# Patient Record
Sex: Female | Born: 1945
Health system: Southern US, Community
[De-identification: ages and names within clinical notes are randomized; demographics above are authoritative.]

## PROBLEM LIST (undated history)

## (undated) DIAGNOSIS — F329 Major depressive disorder, single episode, unspecified: Secondary | ICD-10-CM

## (undated) DIAGNOSIS — G43909 Migraine, unspecified, not intractable, without status migrainosus: Secondary | ICD-10-CM

## (undated) DIAGNOSIS — C50919 Malignant neoplasm of unspecified site of unspecified female breast: Secondary | ICD-10-CM

## (undated) DIAGNOSIS — K219 Gastro-esophageal reflux disease without esophagitis: Secondary | ICD-10-CM

## (undated) DIAGNOSIS — M810 Age-related osteoporosis without current pathological fracture: Secondary | ICD-10-CM

## (undated) DIAGNOSIS — K649 Unspecified hemorrhoids: Secondary | ICD-10-CM

## (undated) DIAGNOSIS — E039 Hypothyroidism, unspecified: Secondary | ICD-10-CM

## (undated) DIAGNOSIS — I10 Essential (primary) hypertension: Secondary | ICD-10-CM

## (undated) DIAGNOSIS — F32A Depression, unspecified: Secondary | ICD-10-CM

## (undated) DIAGNOSIS — E0500 Thyrotoxicosis with diffuse goiter without thyrotoxic crisis or storm: Secondary | ICD-10-CM

## (undated) DIAGNOSIS — Z923 Personal history of irradiation: Secondary | ICD-10-CM

## (undated) DIAGNOSIS — E559 Vitamin D deficiency, unspecified: Secondary | ICD-10-CM

## (undated) HISTORY — DX: Migraine, unspecified, not intractable, without status migrainosus: G43.909

## (undated) HISTORY — PX: CHOLECYSTECTOMY: SHX55

## (undated) HISTORY — PX: SPINE SURGERY: SHX786

## (undated) HISTORY — DX: Vitamin D deficiency, unspecified: E55.9

## (undated) HISTORY — DX: Major depressive disorder, single episode, unspecified: F32.9

## (undated) HISTORY — DX: Gastro-esophageal reflux disease without esophagitis: K21.9

## (undated) HISTORY — DX: Depression, unspecified: F32.A

## (undated) HISTORY — DX: Thyrotoxicosis with diffuse goiter without thyrotoxic crisis or storm: E05.00

## (undated) HISTORY — DX: Age-related osteoporosis without current pathological fracture: M81.0

## (undated) HISTORY — DX: Unspecified hemorrhoids: K64.9

## (undated) HISTORY — DX: Essential (primary) hypertension: I10

## (undated) HISTORY — DX: Malignant neoplasm of unspecified site of unspecified female breast: C50.919

---

## 1995-08-28 DIAGNOSIS — C50919 Malignant neoplasm of unspecified site of unspecified female breast: Secondary | ICD-10-CM

## 1995-08-28 HISTORY — DX: Malignant neoplasm of unspecified site of unspecified female breast: C50.919

## 1996-08-11 HISTORY — PX: BREAST BIOPSY: SHX20

## 1996-08-27 DIAGNOSIS — Z923 Personal history of irradiation: Secondary | ICD-10-CM

## 1996-08-27 HISTORY — DX: Personal history of irradiation: Z92.3

## 1996-08-27 HISTORY — PX: BREAST LUMPECTOMY: SHX2

## 1998-04-15 ENCOUNTER — Encounter: Payer: Self-pay | Admitting: Obstetrics and Gynecology

## 1998-04-15 ENCOUNTER — Ambulatory Visit (HOSPITAL_COMMUNITY): Admission: RE | Admit: 1998-04-15 | Discharge: 1998-04-15 | Payer: Self-pay | Admitting: Obstetrics and Gynecology

## 1998-05-20 ENCOUNTER — Ambulatory Visit (HOSPITAL_COMMUNITY): Admission: RE | Admit: 1998-05-20 | Discharge: 1998-05-20 | Payer: Self-pay | Admitting: Neurosurgery

## 1998-05-30 ENCOUNTER — Encounter: Admission: RE | Admit: 1998-05-30 | Discharge: 1998-08-28 | Payer: Self-pay | Admitting: Radiation Oncology

## 1998-10-11 ENCOUNTER — Encounter: Payer: Self-pay | Admitting: Obstetrics and Gynecology

## 1998-10-11 ENCOUNTER — Ambulatory Visit (HOSPITAL_COMMUNITY): Admission: RE | Admit: 1998-10-11 | Discharge: 1998-10-11 | Payer: Self-pay | Admitting: Obstetrics and Gynecology

## 1999-04-07 ENCOUNTER — Encounter: Payer: Self-pay | Admitting: Obstetrics and Gynecology

## 1999-04-07 ENCOUNTER — Ambulatory Visit (HOSPITAL_COMMUNITY): Admission: RE | Admit: 1999-04-07 | Discharge: 1999-04-07 | Payer: Self-pay | Admitting: Obstetrics and Gynecology

## 1999-06-20 ENCOUNTER — Other Ambulatory Visit: Admission: RE | Admit: 1999-06-20 | Discharge: 1999-06-20 | Payer: Self-pay | Admitting: Obstetrics and Gynecology

## 1999-08-01 ENCOUNTER — Encounter: Admission: RE | Admit: 1999-08-01 | Discharge: 1999-08-01 | Payer: Self-pay | Admitting: Obstetrics and Gynecology

## 1999-08-01 ENCOUNTER — Encounter: Payer: Self-pay | Admitting: Obstetrics and Gynecology

## 2000-05-28 ENCOUNTER — Other Ambulatory Visit: Admission: RE | Admit: 2000-05-28 | Discharge: 2000-05-28 | Payer: Self-pay | Admitting: Obstetrics and Gynecology

## 2001-07-03 ENCOUNTER — Other Ambulatory Visit: Admission: RE | Admit: 2001-07-03 | Discharge: 2001-07-03 | Payer: Self-pay | Admitting: Obstetrics and Gynecology

## 2001-07-08 ENCOUNTER — Encounter: Admission: RE | Admit: 2001-07-08 | Discharge: 2001-07-08 | Payer: Self-pay | Admitting: Obstetrics and Gynecology

## 2001-07-08 ENCOUNTER — Encounter: Payer: Self-pay | Admitting: Obstetrics and Gynecology

## 2002-06-11 ENCOUNTER — Ambulatory Visit (HOSPITAL_COMMUNITY): Admission: RE | Admit: 2002-06-11 | Discharge: 2002-06-11 | Payer: Self-pay | Admitting: Oncology

## 2002-06-11 ENCOUNTER — Encounter (HOSPITAL_COMMUNITY): Payer: Self-pay | Admitting: Oncology

## 2002-06-16 ENCOUNTER — Other Ambulatory Visit: Admission: RE | Admit: 2002-06-16 | Discharge: 2002-06-16 | Payer: Self-pay | Admitting: *Deleted

## 2002-07-09 ENCOUNTER — Encounter: Admission: RE | Admit: 2002-07-09 | Discharge: 2002-07-09 | Payer: Self-pay | Admitting: *Deleted

## 2002-07-20 ENCOUNTER — Encounter: Admission: RE | Admit: 2002-07-20 | Discharge: 2002-07-20 | Payer: Self-pay | Admitting: *Deleted

## 2003-06-24 ENCOUNTER — Other Ambulatory Visit: Admission: RE | Admit: 2003-06-24 | Discharge: 2003-06-24 | Payer: Self-pay | Admitting: *Deleted

## 2003-07-13 ENCOUNTER — Encounter: Admission: RE | Admit: 2003-07-13 | Discharge: 2003-07-13 | Payer: Self-pay | Admitting: *Deleted

## 2004-07-03 ENCOUNTER — Ambulatory Visit: Payer: Self-pay | Admitting: Endocrinology

## 2004-07-13 ENCOUNTER — Ambulatory Visit: Payer: Self-pay | Admitting: Internal Medicine

## 2004-07-31 ENCOUNTER — Ambulatory Visit: Payer: Self-pay | Admitting: Endocrinology

## 2004-08-02 ENCOUNTER — Ambulatory Visit: Payer: Self-pay | Admitting: Endocrinology

## 2004-08-18 ENCOUNTER — Ambulatory Visit: Payer: Self-pay | Admitting: Internal Medicine

## 2004-11-29 ENCOUNTER — Ambulatory Visit: Payer: Self-pay | Admitting: Endocrinology

## 2005-01-01 ENCOUNTER — Ambulatory Visit: Payer: Self-pay | Admitting: Endocrinology

## 2005-01-15 ENCOUNTER — Ambulatory Visit: Payer: Self-pay | Admitting: Endocrinology

## 2005-01-29 ENCOUNTER — Ambulatory Visit: Payer: Self-pay | Admitting: Endocrinology

## 2005-08-22 ENCOUNTER — Encounter: Admission: RE | Admit: 2005-08-22 | Discharge: 2005-08-22 | Payer: Self-pay | Admitting: *Deleted

## 2005-09-21 ENCOUNTER — Ambulatory Visit: Payer: Self-pay | Admitting: Endocrinology

## 2005-12-26 ENCOUNTER — Encounter: Payer: Self-pay | Admitting: Radiation Oncology

## 2006-08-26 ENCOUNTER — Encounter: Admission: RE | Admit: 2006-08-26 | Discharge: 2006-08-26 | Payer: Self-pay | Admitting: *Deleted

## 2006-12-19 ENCOUNTER — Ambulatory Visit: Payer: Self-pay | Admitting: Endocrinology

## 2007-03-13 ENCOUNTER — Ambulatory Visit: Payer: Self-pay | Admitting: Endocrinology

## 2007-04-26 ENCOUNTER — Encounter: Payer: Self-pay | Admitting: Endocrinology

## 2007-04-26 DIAGNOSIS — E039 Hypothyroidism, unspecified: Secondary | ICD-10-CM | POA: Insufficient documentation

## 2007-04-26 DIAGNOSIS — F329 Major depressive disorder, single episode, unspecified: Secondary | ICD-10-CM | POA: Insufficient documentation

## 2007-04-26 DIAGNOSIS — Z853 Personal history of malignant neoplasm of breast: Secondary | ICD-10-CM | POA: Insufficient documentation

## 2007-04-26 DIAGNOSIS — F3289 Other specified depressive episodes: Secondary | ICD-10-CM | POA: Insufficient documentation

## 2007-04-26 DIAGNOSIS — M8580 Other specified disorders of bone density and structure, unspecified site: Secondary | ICD-10-CM | POA: Insufficient documentation

## 2007-09-01 ENCOUNTER — Encounter: Admission: RE | Admit: 2007-09-01 | Discharge: 2007-09-01 | Payer: Self-pay | Admitting: Obstetrics & Gynecology

## 2008-09-02 ENCOUNTER — Encounter: Admission: RE | Admit: 2008-09-02 | Discharge: 2008-09-02 | Payer: Self-pay | Admitting: Obstetrics & Gynecology

## 2009-09-08 ENCOUNTER — Encounter: Admission: RE | Admit: 2009-09-08 | Discharge: 2009-09-08 | Payer: Self-pay | Admitting: Obstetrics & Gynecology

## 2010-09-14 ENCOUNTER — Encounter
Admission: RE | Admit: 2010-09-14 | Discharge: 2010-09-14 | Payer: Self-pay | Source: Home / Self Care | Attending: Obstetrics & Gynecology | Admitting: Obstetrics & Gynecology

## 2011-03-20 ENCOUNTER — Other Ambulatory Visit: Payer: Self-pay | Admitting: Neurosurgery

## 2011-03-20 DIAGNOSIS — M545 Low back pain, unspecified: Secondary | ICD-10-CM

## 2011-03-23 ENCOUNTER — Ambulatory Visit
Admission: RE | Admit: 2011-03-23 | Discharge: 2011-03-23 | Disposition: A | Discharge status: Home / Self Care | Payer: Commercial Managed Care - PPO | Source: Ambulatory Visit | Attending: Neurosurgery | Admitting: Neurosurgery

## 2011-03-23 DIAGNOSIS — M545 Low back pain, unspecified: Secondary | ICD-10-CM

## 2011-03-24 ENCOUNTER — Other Ambulatory Visit: Payer: Self-pay

## 2011-08-10 ENCOUNTER — Other Ambulatory Visit: Payer: Self-pay | Admitting: Obstetrics and Gynecology

## 2011-08-10 DIAGNOSIS — M8580 Other specified disorders of bone density and structure, unspecified site: Secondary | ICD-10-CM

## 2011-08-10 DIAGNOSIS — Z1231 Encounter for screening mammogram for malignant neoplasm of breast: Secondary | ICD-10-CM

## 2011-09-06 ENCOUNTER — Other Ambulatory Visit: Payer: Self-pay | Admitting: Endocrinology

## 2011-09-06 DIAGNOSIS — R51 Headache: Secondary | ICD-10-CM

## 2011-09-07 ENCOUNTER — Ambulatory Visit
Admission: RE | Admit: 2011-09-07 | Discharge: 2011-09-07 | Disposition: A | Discharge status: Home / Self Care | Payer: Commercial Managed Care - PPO | Source: Ambulatory Visit | Attending: Endocrinology | Admitting: Endocrinology

## 2011-09-07 DIAGNOSIS — R51 Headache: Secondary | ICD-10-CM

## 2011-09-25 ENCOUNTER — Ambulatory Visit
Admission: RE | Admit: 2011-09-25 | Discharge: 2011-09-25 | Disposition: A | Discharge status: Home / Self Care | Payer: Commercial Managed Care - PPO | Source: Ambulatory Visit | Attending: Obstetrics and Gynecology | Admitting: Obstetrics and Gynecology

## 2011-09-25 DIAGNOSIS — Z1231 Encounter for screening mammogram for malignant neoplasm of breast: Secondary | ICD-10-CM

## 2011-09-25 DIAGNOSIS — M8580 Other specified disorders of bone density and structure, unspecified site: Secondary | ICD-10-CM

## 2012-01-17 ENCOUNTER — Ambulatory Visit (INDEPENDENT_AMBULATORY_CARE_PROVIDER_SITE_OTHER): Payer: Commercial Managed Care - PPO | Admitting: Family Medicine

## 2012-01-17 DIAGNOSIS — J309 Allergic rhinitis, unspecified: Secondary | ICD-10-CM

## 2012-01-17 DIAGNOSIS — H9209 Otalgia, unspecified ear: Secondary | ICD-10-CM

## 2012-01-17 DIAGNOSIS — J029 Acute pharyngitis, unspecified: Secondary | ICD-10-CM

## 2012-01-17 LAB — POCT RAPID STREP A (OFFICE): Rapid Strep A Screen: NEGATIVE

## 2012-01-17 NOTE — Progress Notes (Signed)
  Subjective:    Patient ID: Victoria Blair, female    DOB: Mar 12, 1946, 66 y.o.   MRN: [id number]  HPI Victoria Blair is a 66 y.o. female Sore throat started last night.  Both ears bothering her since February - followed by Dr. Lucianne Muss.  Had CT scan. Seen by Dr. Ezzard Standing in March. Sinus pressure.  Sudafed prior.  Not taking anything for allergies.   Prior on ear drops.  Itchy eras - now applying tac 0.1% cream.  Review of Systems  Constitutional: Negative for fever and chills.  HENT: Positive for ear pain, congestion, sore throat, voice change and sinus pressure. Negative for trouble swallowing.        Objective:   Physical Exam  Constitutional: She is oriented to person, place, and time. She appears well-developed and well-nourished. No distress.  HENT:  Head: Normocephalic and atraumatic.  Right Ear: Hearing, tympanic membrane and ear canal normal. Tympanic membrane is not injected, not erythematous and not bulging. No middle ear effusion.  Left Ear: Hearing, tympanic membrane and ear canal normal. Tympanic membrane is not injected, not erythematous and not bulging.  No middle ear effusion.  Nose: Nose normal. Right sinus exhibits no maxillary sinus tenderness and no frontal sinus tenderness. Left sinus exhibits no maxillary sinus tenderness and no frontal sinus tenderness.  Mouth/Throat: Oropharynx is clear and moist. No oropharyngeal exudate.       Dry skin at enntry of canals bilaterally L>R. No canal edema/erythema.  Eyes: Conjunctivae and EOM are normal. Pupils are equal, round, and reactive to light.  Cardiovascular: Normal rate, regular rhythm, normal heart sounds and intact distal pulses.   No murmur heard. Pulmonary/Chest: Effort normal and breath sounds normal. No respiratory distress. She has no wheezes. She has no rhonchi.  Neurological: She is alert and oriented to person, place, and time.  Skin: Skin is warm and dry. No rash noted.  Psychiatric: She has a normal mood  and affect. Her behavior is normal.   Results for orders placed in visit on 01/17/12  POCT RAPID STREP A (OFFICE)      Component Value Range   Rapid Strep A Screen Negative  Negative           Assessment & Plan:  Victoria Blair is a 66 y.o. female Longstanding otalgia with external itching - ? Atopic derm component.  Cont TAC 0.1 % BID prn, eucerin lotion BID prn. Pharyngitis - URI, vs underlying allergic rhinitis.  Sx care, trial of Zyrtec 10mg  QD and recheck if not improving in 7-10 days, sooner if any worsening, including fever

## 2012-01-17 NOTE — Patient Instructions (Signed)
Follow up with Dr. Lucianne Muss for your ears, continue the steroid cream up to twice per day for itching, and you can apply Eucerin twice per day for the dry skin.  Try Zyrtec 10mg  - 1 per day for allergies. Saline nasal spray atleast 4 times per day for nasal congestion, over the counter mucinex or mucinex DM for cough if needed, drink plenty of fluids.  Return to clinic if any worsening.

## 2012-01-19 ENCOUNTER — Ambulatory Visit (INDEPENDENT_AMBULATORY_CARE_PROVIDER_SITE_OTHER): Payer: Commercial Managed Care - PPO | Admitting: Family Medicine

## 2012-01-19 VITALS — BP 107/59 | HR 100 | Temp 98.4°F | Resp 16 | Ht 66.5 in | Wt 125.8 lb

## 2012-01-19 DIAGNOSIS — S30860A Insect bite (nonvenomous) of lower back and pelvis, initial encounter: Secondary | ICD-10-CM

## 2012-01-19 DIAGNOSIS — W57XXXA Bitten or stung by nonvenomous insect and other nonvenomous arthropods, initial encounter: Secondary | ICD-10-CM

## 2012-01-19 MED ORDER — DOXYCYCLINE HYCLATE 100 MG PO TABS: ORAL_TABLET | ORAL | Status: DC

## 2012-01-19 NOTE — Progress Notes (Signed)
  Subjective:    Patient ID: Victoria Blair, female    DOB: 1946-05-17, 66 y.o.   MRN: [id number]  HPI  Patient presents after finding tick on back Monday morning. Area is now red and itchy Denies fever or chills or systemic symptoms   Review of Systems     Objective:   Physical Exam  Constitutional: She appears well-developed.  HENT:       Clear pnd  Neck: Neck supple. No thyromegaly present.  Cardiovascular: Normal rate, regular rhythm and normal heart sounds.   Pulmonary/Chest: Effort normal and breath sounds normal.  Skin:       (R) back with erythematous lesion; entire tick removed        Assessment & Plan:  Tick bite Allergic rhinitis  Doxycycline 100 mg X 2 per guidelines to prevent tick borne disease.

## 2012-01-20 ENCOUNTER — Telehealth: Payer: Self-pay

## 2012-01-20 NOTE — Telephone Encounter (Signed)
PATIENT STATES SHE SAW DR. Hal Hope Saturday FOR A TICK BITE ON HER BACK. DR. Hal Hope ONLY GAVE HER 2 PILLS. SHE WANTS TO KNOW IF SHE SHOULD HAVE MORE THAN 2 PILLS? BEST PHONE [phone] (CELL)  BEST PHARMACY CHOICE IS [facility] ([place])

## 2012-01-21 NOTE — Telephone Encounter (Signed)
Current guidelines for prevention of tick-borne illness is 200 mg of Doxycycline x 1 dose (she received 2 100 mg pills).

## 2012-01-22 NOTE — Telephone Encounter (Signed)
LMOM advising patient info below. 

## 2012-08-21 ENCOUNTER — Other Ambulatory Visit: Payer: Self-pay | Admitting: Obstetrics & Gynecology

## 2012-08-21 DIAGNOSIS — Z9889 Other specified postprocedural states: Secondary | ICD-10-CM

## 2012-08-21 DIAGNOSIS — Z1231 Encounter for screening mammogram for malignant neoplasm of breast: Secondary | ICD-10-CM

## 2012-08-21 DIAGNOSIS — Z853 Personal history of malignant neoplasm of breast: Secondary | ICD-10-CM

## 2012-09-25 ENCOUNTER — Ambulatory Visit: Payer: Commercial Managed Care - PPO

## 2012-10-02 ENCOUNTER — Ambulatory Visit: Payer: Commercial Managed Care - PPO

## 2012-10-14 ENCOUNTER — Ambulatory Visit: Payer: Commercial Managed Care - PPO

## 2012-10-21 ENCOUNTER — Other Ambulatory Visit: Payer: Self-pay | Admitting: Obstetrics & Gynecology

## 2012-10-21 DIAGNOSIS — M810 Age-related osteoporosis without current pathological fracture: Secondary | ICD-10-CM

## 2012-10-29 ENCOUNTER — Ambulatory Visit
Admission: RE | Admit: 2012-10-29 | Discharge: 2012-10-29 | Disposition: A | Discharge status: Home / Self Care | Payer: Commercial Managed Care - PPO | Source: Ambulatory Visit | Attending: Obstetrics & Gynecology | Admitting: Obstetrics & Gynecology

## 2012-10-29 DIAGNOSIS — Z853 Personal history of malignant neoplasm of breast: Secondary | ICD-10-CM

## 2012-10-29 DIAGNOSIS — Z9889 Other specified postprocedural states: Secondary | ICD-10-CM

## 2012-10-29 DIAGNOSIS — Z1231 Encounter for screening mammogram for malignant neoplasm of breast: Secondary | ICD-10-CM

## 2012-12-09 ENCOUNTER — Ambulatory Visit: Payer: Commercial Managed Care - PPO | Admitting: Family Medicine

## 2012-12-09 ENCOUNTER — Ambulatory Visit: Payer: Commercial Managed Care - PPO

## 2012-12-09 VITALS — BP 126/68 | HR 87 | Temp 97.5°F | Resp 18 | Ht 66.0 in | Wt 126.8 lb

## 2012-12-09 DIAGNOSIS — R079 Chest pain, unspecified: Secondary | ICD-10-CM

## 2012-12-09 DIAGNOSIS — M2190 Unspecified acquired deformity of unspecified limb: Secondary | ICD-10-CM

## 2012-12-09 DIAGNOSIS — M940 Chondrocostal junction syndrome [Tietze]: Secondary | ICD-10-CM

## 2012-12-09 DIAGNOSIS — T148XXA Other injury of unspecified body region, initial encounter: Secondary | ICD-10-CM

## 2012-12-09 LAB — POCT CBC
Granulocyte percent: 51 % (ref 37–80)
HCT, POC: 37.6 % — AB (ref 37.7–47.9)
Hemoglobin: 11.7 g/dL — AB (ref 12.2–16.2)
Lymph, poc: 2.9 (ref 0.6–3.4)
MCH, POC: 29.5 pg (ref 27–31.2)
MCHC: 31.1 g/dL — AB (ref 31.8–35.4)
MCV: 94.8 fL (ref 80–97)
MID (cbc): 0.6 (ref 0–0.9)
MPV: 8.6 fL (ref 0–99.8)
POC Granulocyte: 3.7 (ref 2–6.9)
POC LYMPH PERCENT: 40.1 % (ref 10–50)
POC MID %: 8.9 % (ref 0–12)
Platelet Count, POC: 246 10*3/uL (ref 142–424)
RBC: 3.97 M/uL — AB (ref 4.04–5.48)
RDW, POC: 13.3 %
WBC: 7.3 10*3/uL (ref 4.6–10.2)

## 2012-12-09 NOTE — Progress Notes (Signed)
[facility]:  Office Visit  Chief Complaint:  Chief Complaint  Patient presents with  . Chest Pain    both sides of ribs- bruising on right side and pain on left side    HPI: Victoria Blair is a 67 y.o. female who complains of bilateral chest pain on right side and then left, she had underwire bra on during a zumba class and then had bruising afterwards at the end of January, She was sore, now is having left sided chest pressure. Has had dx of breast cancer s/p lumpectomy. Has some SOB. Has been doing netty pot for possible allergies/sinus problems. Has been going on since January but since the last 1 week has gotten worse. Has crampy rib/chest pain. + smoker. + breast cancer in remission x 15 years. Last mamogram was within 1 year and did not show e/o . Recent travels was in January and in March 27, flew and drove back from [place] , sister died from pancreatic cancer. She also has osteoporosis. Denies any leg pain, swelling, tenderness. She has bilateral leg cramps which she has had for many years and that has not changed. Denies any recent surgeries. She has HTN but denies DM, XOL  Denies palpitations, diaphoresis, DOE, coughing, fatigue, numbness, tingling , abdominal or shoulder pain. 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   Past Medical History  Diagnosis Date  . Cancer   . Thyroid disease   . Osteoporosis   . Breast cancer   . Breast cancer    Past Surgical History  Procedure Laterality Date  . Breast surgery    . Cholecystectomy    . Spine surgery    . Cesarean section     History   Social History  . Marital Status: Divorced    Spouse Name: N/A    Number of Children: N/A  . Years of Education: N/A   Social History Main Topics  . Smoking status: Current Every Day Smoker -- 0.50 packs/day for 40 years    Types: Cigarettes  . Smokeless tobacco: None  . Alcohol Use: Yes     Comment: seldom  . Drug Use: No  . Sexually  Active: Yes   Other Topics Concern  . None   Social History Narrative  . None   Family History  Problem Relation Age of Onset  . Heart disease Father   . Pulmonary fibrosis Father   . Pancreatic cancer Sister   . Sleep apnea Son    Allergies  Allergen Reactions  . Paroxetine     Pt states not allergic to  . Raloxifene     Pt states not allergic to.   Prior to Admission medications   Medication Sig Start Date End Date Taking? Authorizing Provider  amLODipine-benazepril (LOTREL) 5-10 MG per capsule Take 1 capsule by mouth daily.   Yes Historical Provider, MD  Calcium Carbonate-Vitamin D (CALCIUM + D PO) Take by mouth.   Yes Historical Provider, MD  fish oil-omega-3 fatty acids 1000 MG capsule Take by mouth daily.   Yes Historical Provider, MD  levothyroxine (SYNTHROID, LEVOTHROID) 112 MCG tablet Take 112 mcg by mouth daily.   Yes Historical Provider, MD  Multiple Vitamin (MULTIVITAMIN) tablet Take 1 tablet by mouth daily.   Yes Historical Provider, MD  doxycycline (VIBRA-TABS) 100 MG tablet 2 po X 1 01/19/12   Dois Davenport, MD     ROS: The patient denies fevers, chills, night sweats, unintentional  weight loss,  palpitations, wheezing, dyspnea on exertion, nausea, vomiting, abdominal pain, dysuria, hematuria, melena, numbness, weakness, or tingling.  All other systems have been reviewed and were otherwise negative with the exception of those mentioned in the HPI and as above.    PHYSICAL EXAM: Filed Vitals:   12/09/12 1925  BP: 126/68  Pulse: 87  Temp: 97.5 F (36.4 C)  Resp: 18   Filed Vitals:   12/09/12 1925  Height: 5\' 6"  (1.676 m)  Weight: 126 lb 12.8 oz (57.516 kg)   Body mass index is 20.48 kg/(m^2).  General: Alert, no acute distress HEENT:  Normocephalic, atraumatic, oropharynx patent. EOMI, PERRLA. Fundoscopic exam normal.  Cardiovascular:  Regular rate and rhythm, no rubs murmurs or gallops.  No Carotid bruits, radial pulse intact. No pedal edema.   Respiratory: Clear to auscultation bilaterally.  No wheezes, rales, or rhonchi.  No cyanosis, no use of accessory musculature GI: No organomegaly, abdomen is soft and non-tender, positive bowel sounds.  No masses. Skin: No rashes. Neurologic: Facial musculature symmetric. Psychiatric: Patient is appropriate throughout our interaction. Lymphatic: No cervical lymphadenopathy Musculoskeletal: Gait intact. + residual bruising along rib cage at lower breast line where wire bra line is, right worse than left. She has tenderness on palpation of chest wall.   LABS: Results for orders placed in visit on 12/09/12  POCT CBC      Result Value Range   WBC 7.3  4.6 - 10.2 K/uL   Lymph, poc 2.9  0.6 - 3.4   POC LYMPH PERCENT 40.1  10 - 50 %L   MID (cbc) 0.6  0 - 0.9   POC MID % 8.9  0 - 12 %M   POC Granulocyte 3.7  2 - 6.9   Granulocyte percent 51.0  37 - 80 %G   RBC 3.97 (*) 4.04 - 5.48 M/uL   Hemoglobin 11.7 (*) 12.2 - 16.2 g/dL   HCT, POC 16.1 (*) 09.6 - 47.9 %   MCV 94.8  80 - 97 fL   MCH, POC 29.5  27 - 31.2 pg   MCHC 31.1 (*) 31.8 - 35.4 g/dL   RDW, POC 04.5     Platelet Count, POC 246  142 - 424 K/uL   MPV 8.6  0 - 99.8 fL     EKG/XRAY:   Primary read interpreted by Dr. Conley Rolls at [facility] No infiltrate or pneumothorax Increase vascular markings    ASSESSMENT/PLAN: Encounter Diagnoses  Name Primary?  . Chest pain Yes  . Sprain and strain   . Costochondritis    [name] is a pleasant 67 y/o white female with 3 month history of bilateral anterior chest pain which has worsened in last 1 week, all started after she wore underwire bra to a zumba class with residual bruising along bilateral chest wall. She does have RF for PE/DVT: breast cancer, recent long car ride 3 weeks ago. RF for CAD include HTN, +smoker. She clinically presents with s/sx of msk chest wall sprain/strain and costochondritis. Due to RF for PE/DVT I will get a D-dimer and if that is elevated patient agrees to getting  CTA to rule out PE. Her chest xray was WNL. CBC showed minimal anemia but has had colonoscopy in last 10 years, benign polyps.  Advise to ask her PCP what her normal Hgb is, she thinks it has always been slightly low. Defer EKG sicne the pain is not related to activity and has persisted for the last 3 months.  BP  has always been very well controlled.  CMP, D-dimer pending F/u prn, go to ER prn for worsening sxs   Lenton Gendreau PHUONG, DO 12/09/2012 8:58 PM

## 2012-12-10 ENCOUNTER — Telehealth: Payer: Self-pay | Admitting: Family Medicine

## 2012-12-10 LAB — D-DIMER, QUANTITATIVE: D-Dimer, Quant: 0.27 ug{FEU}/mL (ref 0.00–0.48)

## 2012-12-10 NOTE — Telephone Encounter (Signed)
D/w patient labs. NOrmal D-dimer. She will check with Dr. Lucianne Muss about her Hgb and see if that is stable. She is UTD on colonoscopy, next one is 1 year from now. Normal except for benign polyps.

## 2013-04-22 ENCOUNTER — Other Ambulatory Visit: Payer: Self-pay | Admitting: *Deleted

## 2013-04-22 MED ORDER — AMLODIPINE BESY-BENAZEPRIL HCL 5-10 MG PO CAPS: 1.0000 | ORAL_CAPSULE | Freq: Every day | ORAL | Status: DC

## 2013-05-25 ENCOUNTER — Other Ambulatory Visit: Payer: Self-pay | Admitting: *Deleted

## 2013-05-25 DIAGNOSIS — E039 Hypothyroidism, unspecified: Secondary | ICD-10-CM

## 2013-05-26 ENCOUNTER — Other Ambulatory Visit: Payer: Self-pay | Admitting: Endocrinology

## 2013-05-26 DIAGNOSIS — M810 Age-related osteoporosis without current pathological fracture: Secondary | ICD-10-CM

## 2013-05-26 DIAGNOSIS — I10 Essential (primary) hypertension: Secondary | ICD-10-CM

## 2013-05-26 DIAGNOSIS — E890 Postprocedural hypothyroidism: Secondary | ICD-10-CM | POA: Insufficient documentation

## 2013-05-26 DIAGNOSIS — E7800 Pure hypercholesterolemia, unspecified: Secondary | ICD-10-CM

## 2013-05-28 ENCOUNTER — Encounter: Payer: Self-pay | Admitting: Endocrinology

## 2013-05-28 ENCOUNTER — Ambulatory Visit: Payer: Commercial Managed Care - PPO | Admitting: Endocrinology

## 2013-05-28 ENCOUNTER — Ambulatory Visit (INDEPENDENT_AMBULATORY_CARE_PROVIDER_SITE_OTHER): Payer: Commercial Managed Care - PPO | Admitting: Endocrinology

## 2013-05-28 ENCOUNTER — Other Ambulatory Visit (INDEPENDENT_AMBULATORY_CARE_PROVIDER_SITE_OTHER): Payer: Commercial Managed Care - PPO

## 2013-05-28 VITALS — BP 114/68 | HR 67 | Temp 98.4°F | Resp 12 | Ht 67.0 in | Wt 126.4 lb

## 2013-05-28 DIAGNOSIS — M810 Age-related osteoporosis without current pathological fracture: Secondary | ICD-10-CM

## 2013-05-28 DIAGNOSIS — I10 Essential (primary) hypertension: Secondary | ICD-10-CM

## 2013-05-28 DIAGNOSIS — E039 Hypothyroidism, unspecified: Secondary | ICD-10-CM

## 2013-05-28 DIAGNOSIS — E7800 Pure hypercholesterolemia, unspecified: Secondary | ICD-10-CM

## 2013-05-28 DIAGNOSIS — E890 Postprocedural hypothyroidism: Secondary | ICD-10-CM

## 2013-05-28 MED ORDER — AMLODIPINE BESY-BENAZEPRIL HCL 2.5-10 MG PO CAPS: 1.0000 | ORAL_CAPSULE | Freq: Every day | ORAL | Status: DC

## 2013-05-28 NOTE — Progress Notes (Signed)
Patient ID: Victoria Blair, female   DOB: 01-31-1946, 67 y.o.   MRN: [id number]  Reason for Appointment:  Hypothyroidism, followup visit   History of Present Illness:   The hypothyroidism was first diagnosed in 1978 after I-131 treatment for Graves' disease The symptoms consistent with hypothyroidism are: fatigue although she complains of being tired for quite a while She does not complain of any unusual cord intolerance or hair loss  The treatments that the patient has taken include Synthroid.          Compliance with the medical regimen has been as prescribed with taking the tablet in the morning before breakfast.  HYPERTENSION: She has been on treatment since 2009 and has been on Lotrel since then with good control. Does not monitor at home     Medication List       This list is accurate as of: 05/28/13  9:03 AM.  Always use your most recent med list.               amLODipine-benazepril 5-10 MG per capsule  Commonly known as:  LOTREL  Take 1 capsule by mouth daily.     CALCIUM + D PO  Take by mouth.     doxycycline 100 MG tablet  Commonly known as:  VIBRA-TABS  2 po X 1     fish oil-omega-3 fatty acids 1000 MG capsule  Take by mouth daily.     levothyroxine 112 MCG tablet  Commonly known as:  SYNTHROID, LEVOTHROID  Take 112 mcg by mouth daily.     multivitamin tablet  Take 1 tablet by mouth daily.        Past Medical History  Diagnosis Date  . Cancer   . Thyroid disease   . Osteoporosis   . Breast cancer   . Breast cancer     Past Surgical History  Procedure Laterality Date  . Breast surgery    . Cholecystectomy    . Spine surgery    . Cesarean section      Family History  Problem Relation Age of Onset  . Heart disease Father   . Pulmonary fibrosis Father   . Pancreatic cancer Sister   . Sleep apnea Son     Social History:  reports that she has been smoking Cigarettes.  She has a 20 pack-year smoking history. She does not have any  smokeless tobacco history on file. She reports that  drinks alcohol. She reports that she does not use illicit drugs.  Allergies:  Allergies  Allergen Reactions  . Paroxetine     Pt states not allergic to  . Raloxifene     Pt states not allergic to.   REVIEW of systems:  She has osteopenia; previously was given Actonel but stopped this because of difficulty with compliance. T score -1.7 at spine and -1.1 at left hip. Taking Evista since 5/09.     Examination:   BP 110/62  Pulse 67  Temp(Src) 98.4 F (36.9 C)  Resp 12  Ht 5\' 7"  (1.702 m)  Wt 126 lb 6.4 oz (57.335 kg)  BMI 19.79 kg/m2  SpO2 97%   GENERAL APPEARANCE: Alert And looks well.  No puffiness of face or periorbital edema.         NECK: no thyromegaly.          NEUROLOGIC EXAM: DTRs 2+ bilaterally at biceps.    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

## 2013-05-28 NOTE — Patient Instructions (Addendum)
Will change BP Rx

## 2013-05-29 LAB — COMPREHENSIVE METABOLIC PANEL
AST: 24 U/L (ref 0–37)
Albumin: 3.9 g/dL (ref 3.5–5.2)
BUN: 20 mg/dL (ref 6–23)
CO2: 30 mEq/L (ref 19–32)
Calcium: 8.8 mg/dL (ref 8.4–10.5)
Chloride: 101 mEq/L (ref 96–112)
Glucose, Bld: 87 mg/dL (ref 70–99)
Potassium: 4.1 mEq/L (ref 3.5–5.1)

## 2013-05-29 LAB — LIPID PANEL
Cholesterol: 171 mg/dL (ref 0–200)
Total CHOL/HDL Ratio: 4
Triglycerides: 41 mg/dL (ref 0.0–149.0)

## 2013-05-29 NOTE — Progress Notes (Signed)
Quick Note:  Please let patient know that the lab results are good and no further action needed ______ 

## 2013-06-02 ENCOUNTER — Encounter: Payer: Self-pay | Admitting: *Deleted

## 2013-07-12 ENCOUNTER — Emergency Department (HOSPITAL_COMMUNITY)
Admission: EM | Admit: 2013-07-12 | Discharge: 2013-07-12 | Disposition: A | Discharge status: Home / Self Care | Payer: Commercial Managed Care - PPO | Attending: Emergency Medicine | Admitting: Emergency Medicine

## 2013-07-12 ENCOUNTER — Encounter (HOSPITAL_COMMUNITY): Payer: Self-pay | Admitting: Emergency Medicine

## 2013-07-12 DIAGNOSIS — Z79899 Other long term (current) drug therapy: Secondary | ICD-10-CM | POA: Insufficient documentation

## 2013-07-12 DIAGNOSIS — X500XXA Overexertion from strenuous movement or load, initial encounter: Secondary | ICD-10-CM | POA: Insufficient documentation

## 2013-07-12 DIAGNOSIS — F17200 Nicotine dependence, unspecified, uncomplicated: Secondary | ICD-10-CM | POA: Insufficient documentation

## 2013-07-12 DIAGNOSIS — Y92009 Unspecified place in unspecified non-institutional (private) residence as the place of occurrence of the external cause: Secondary | ICD-10-CM | POA: Insufficient documentation

## 2013-07-12 DIAGNOSIS — E079 Disorder of thyroid, unspecified: Secondary | ICD-10-CM | POA: Insufficient documentation

## 2013-07-12 DIAGNOSIS — S335XXA Sprain of ligaments of lumbar spine, initial encounter: Secondary | ICD-10-CM | POA: Insufficient documentation

## 2013-07-12 DIAGNOSIS — S39012A Strain of muscle, fascia and tendon of lower back, initial encounter: Secondary | ICD-10-CM

## 2013-07-12 DIAGNOSIS — Y9389 Activity, other specified: Secondary | ICD-10-CM | POA: Insufficient documentation

## 2013-07-12 DIAGNOSIS — M810 Age-related osteoporosis without current pathological fracture: Secondary | ICD-10-CM | POA: Insufficient documentation

## 2013-07-12 DIAGNOSIS — Z853 Personal history of malignant neoplasm of breast: Secondary | ICD-10-CM | POA: Insufficient documentation

## 2013-07-12 MED ORDER — HYDROCODONE-ACETAMINOPHEN 5-325 MG PO TABS: 2.0000 | ORAL_TABLET | ORAL | Status: DC | PRN

## 2013-07-12 MED ORDER — KETOROLAC TROMETHAMINE 60 MG/2ML IM SOLN
60.0000 mg | Freq: Once | INTRAMUSCULAR | Status: AC
  Administered 2013-07-12: 60 mg via INTRAMUSCULAR
  Filled 2013-07-12: qty 2

## 2013-07-12 MED ORDER — DIAZEPAM 5 MG PO TABS: 5.0000 mg | ORAL_TABLET | Freq: Two times a day (BID) | ORAL | Status: DC

## 2013-07-12 NOTE — ED Provider Notes (Signed)
CSN: [id number]     Arrival date & time 07/12/13  1037 History  This chart was scribed for non-physician practitioner, Irish Elders, NP working with Bonnita Levan. Bernette Mayers, MD by Greggory Stallion, ED scribe. This patient was seen in room TR11C/TR11C and the patient's care was started at 11:04 AM.   Chief Complaint  Patient presents with  . Back Pain   The history is provided by the patient. No language interpreter was used.   HPI Comments: Victoria Blair is a 67 y.o. female who presents to the Emergency Department complaining of gradual onset, constant lower back pain that started a few days ago. Pt states she has been moving stuff around recently. Certain movements worsen the pain. She has taken a natural muscle relaxer with little relief. Pt has a history of L4 and L5 surgery.   Past Medical History  Diagnosis Date  . Cancer   . Thyroid disease   . Osteoporosis   . Breast cancer   . Breast cancer    Past Surgical History  Procedure Laterality Date  . Breast surgery    . Cholecystectomy    . Spine surgery    . Cesarean section     Family History  Problem Relation Age of Onset  . Heart disease Father   . Pulmonary fibrosis Father   . Pancreatic cancer Sister   . Sleep apnea Son    History  Substance Use Topics  . Smoking status: Current Every Day Smoker -- 0.50 packs/day for 40 years    Types: Cigarettes  . Smokeless tobacco: Not on file  . Alcohol Use: Yes     Comment: seldom   OB History   Grav Para Term Preterm Abortions TAB SAB Ect Mult Living                 Review of Systems  Musculoskeletal: Positive for back pain and myalgias.  All other systems reviewed and are negative.   Allergies  Paroxetine and Raloxifene  Home Medications   Current Outpatient Rx  Name  Route  Sig  Dispense  Refill  . amlodipine-benazepril (LOTREL) 2.5-10 MG per capsule   Oral   Take 1 capsule by mouth daily.   30 capsule   5   . Calcium Carbonate-Vitamin D (CALCIUM + D  PO)   Oral   Take by mouth.         . fish oil-omega-3 fatty acids 1000 MG capsule   Oral   Take by mouth daily.         Marland Kitchen levothyroxine (SYNTHROID, LEVOTHROID) 112 MCG tablet   Oral   Take 112 mcg by mouth daily.         . Multiple Vitamin (MULTIVITAMIN) tablet   Oral   Take 1 tablet by mouth daily.         . raloxifene (EVISTA) 60 MG tablet   Oral   Take 60 mg by mouth daily.          BP 129/55  Pulse 90  Temp(Src) 97.5 F (36.4 C) (Oral)  Resp 22  SpO2 100%  Physical Exam  Nursing note and vitals reviewed. Constitutional: She is oriented to person, place, and time. She appears well-developed and well-nourished. No distress.  HENT:  Head: Normocephalic and atraumatic.  Eyes: EOM are normal.  Neck: Neck supple. No tracheal deviation present.  Cardiovascular: Normal rate.   Pulmonary/Chest: Effort normal. No respiratory distress.  Musculoskeletal: Normal range of motion.  Right paravertebral tenderness along  the area of her waist.   Neurological: She is alert and oriented to person, place, and time.  Skin: Skin is warm and dry.  Psychiatric: She has a normal mood and affect. Her behavior is normal.    ED Course  Procedures (including critical care time)  DIAGNOSTIC STUDIES: Oxygen Saturation is 100% on RA, normal by my interpretation.    COORDINATION OF CARE: 11:07 AM-Discussed treatment plan which includes Toradol in the ED, a muscle relaxer, warm compresses and antiinflammatory with pt at bedside and pt agreed to plan.   Labs Review Labs Reviewed - No data to display Imaging Review No results found.  EKG Interpretation   None       MDM   1. Back strain, initial encounter    Paravertebral tenderness and muscle spasm. Reports that she may have strained her back while moving things and picking up items at home, remembers her back hurting with a twisting motion. No numbness, tingling or focal deficits or weakness. No problems with gait.  Neuro exam reassuring. Percocet for break through pain and valium for muscle spasm. Discussed plan and pt agreed. VS stable for discharge.   I personally performed the services described in this documentation, which was scribed in my presence. The recorded information has been reviewed and is accurate.   Irish Elders, NP 07/12/13 [id number]

## 2013-07-12 NOTE — ED Notes (Signed)
Pt. States she had lower pain while "moving stuff around".

## 2013-07-14 NOTE — ED Provider Notes (Signed)
Medical screening examination/treatment/procedure(s) were performed by non-physician practitioner and as supervising physician I was immediately available for consultation/collaboration.  EKG Interpretation   None         Charles B. Sheldon, MD 07/14/13 0700 

## 2013-08-25 ENCOUNTER — Other Ambulatory Visit: Payer: Self-pay | Admitting: *Deleted

## 2013-08-25 MED ORDER — LEVOTHYROXINE SODIUM 112 MCG PO TABS: 112.0000 ug | ORAL_TABLET | Freq: Every morning | ORAL | Status: DC

## 2013-09-03 ENCOUNTER — Encounter: Payer: Self-pay | Admitting: *Deleted

## 2013-09-03 ENCOUNTER — Ambulatory Visit (INDEPENDENT_AMBULATORY_CARE_PROVIDER_SITE_OTHER): Payer: Commercial Managed Care - PPO | Admitting: Endocrinology

## 2013-09-03 ENCOUNTER — Other Ambulatory Visit: Payer: Self-pay | Admitting: *Deleted

## 2013-09-03 ENCOUNTER — Encounter: Payer: Self-pay | Admitting: Endocrinology

## 2013-09-03 VITALS — BP 124/72 | HR 83 | Temp 98.2°F | Resp 12 | Ht 67.0 in | Wt 124.1 lb

## 2013-09-03 DIAGNOSIS — M810 Age-related osteoporosis without current pathological fracture: Secondary | ICD-10-CM

## 2013-09-03 DIAGNOSIS — M1990 Unspecified osteoarthritis, unspecified site: Secondary | ICD-10-CM

## 2013-09-03 DIAGNOSIS — I10 Essential (primary) hypertension: Secondary | ICD-10-CM

## 2013-09-03 DIAGNOSIS — E890 Postprocedural hypothyroidism: Secondary | ICD-10-CM

## 2013-09-03 DIAGNOSIS — E7800 Pure hypercholesterolemia, unspecified: Secondary | ICD-10-CM

## 2013-09-03 LAB — BASIC METABOLIC PANEL
BUN: 17 mg/dL (ref 6–23)
CO2: 30 mEq/L (ref 19–32)
Calcium: 9.4 mg/dL (ref 8.4–10.5)
Chloride: 105 mEq/L (ref 96–112)
Creatinine, Ser: 0.7 mg/dL (ref 0.4–1.2)
GFR: 95.38 mL/min (ref >60.00)
Glucose, Bld: 100 mg/dL — ABNORMAL HIGH (ref 70–99)
Potassium: 3.9 mEq/L (ref 3.5–5.1)
Sodium: 140 mEq/L (ref 135–145)

## 2013-09-03 LAB — TSH: TSH: 0.08 u[IU]/mL — ABNORMAL LOW (ref 0.35–5.50)

## 2013-09-03 LAB — T4, FREE: Free T4: 1.51 ng/dL (ref 0.60–1.60)

## 2013-09-03 MED ORDER — LEVOTHYROXINE SODIUM 88 MCG PO TABS: 88.0000 ug | ORAL_TABLET | Freq: Every day | ORAL | Status: DC

## 2013-09-03 NOTE — Progress Notes (Signed)
Patient ID: Victoria Blair, female   DOB: 20-Oct-1945, 68 y.o.   MRN: [id number]  Reason for Appointment:  Hypothyroidism, followup visit   History of Present Illness:   The hypothyroidism was first diagnosed in 1978 after I-131 treatment for Graves' disease The symptoms consistent with hypothyroidism are: fatigue although she complains of being tired for quite a while She does not complain of any unusual cord intolerance or hair loss  The treatments that the patient has taken include Synthroid.          Compliance with the medical regimen has been as prescribed with taking the tablet in the morning before breakfast.  Lab Results  Component Value Date   TSH 0.46 05/28/2013    HYPERTENSION: She has been on treatment since 2009 and has been on Lotrel since then with good control.  Because of low normal blood pressure in 10/14 her Lotrel was reduced to 2.5/10. Blood pressure is quite normal now Does not monitor at home No exercise regimen.     Medication List       This list is accurate as of: 09/03/13  8:15 AM.  Always use your most recent med list.               amlodipine-benazepril 2.5-10 MG per capsule  Commonly known as:  LOTREL  Take 1 capsule by mouth daily.     BIOFREEZE EX  Apply 1 application topically as needed (Joint pain).     BREWERS YEAST PO  Take 1 tablet by mouth daily as needed (Vitamin B Supplementation).     CALCIUM + D PO  Take 1 tablet by mouth daily.     cetirizine 10 MG tablet  Commonly known as:  ZYRTEC  Take 10 mg by mouth daily as needed for allergies.     diazepam 5 MG tablet  Commonly known as:  VALIUM  Take 1 tablet (5 mg total) by mouth 2 (two) times daily.     fish oil-omega-3 fatty acids 1000 MG capsule  Take 1 g by mouth daily.     HYDROcodone-acetaminophen 5-325 MG per tablet  Commonly known as:  NORCO/VICODIN  Take 2 tablets by mouth every 4 (four) hours as needed.     levothyroxine 112 MCG tablet  Commonly known as:   SYNTHROID, LEVOTHROID  Take 1 tablet (112 mcg total) by mouth every morning.     multivitamin tablet  Take 1 tablet by mouth daily.     PRESCRIPTION MEDICATION  Place 2 sprays into the nose daily as needed (Nasal allergies).     VITAMIN B COMPLEX PO  Take 1 tablet by mouth daily.     VITAMIN E PO  Take 1 tablet by mouth daily.        Past Medical History  Diagnosis Date  . Cancer   . Thyroid disease   . Osteoporosis   . Breast cancer   . Breast cancer     Past Surgical History  Procedure Laterality Date  . Breast surgery    . Cholecystectomy    . Spine surgery    . Cesarean section      Family History  Problem Relation Age of Onset  . Heart disease Father   . Pulmonary fibrosis Father   . Pancreatic cancer Sister   . Sleep apnea Son     Social History:  reports that she has been smoking Cigarettes.  She has a 20 pack-year smoking history. She does not have any smokeless tobacco  history on file. She reports that she drinks alcohol. She reports that she does not use illicit drugs.  1/2 week per week  Allergies:  Allergies  Allergen Reactions  . Paroxetine     Pt states not allergic to  . Raloxifene     Pt states not allergic to.   REVIEW of systems:   She has osteopenia; previously was given Actonel but stopped this because of difficulty with compliance. T score -1.7 at spine and -1.1 at left hip. Taking Evista since 5/09.   She has had discomfort in her thumb joints with enlargement   Examination:   BP 124/72  Pulse 83  Temp(Src) 98.2 F (36.8 C)  Resp 12  Ht 5\' 7"  (1.702 m)  Wt 124 lb 1.6 oz (56.291 kg)  BMI 19.43 kg/m2  SpO2 97%   GENERAL APPEARANCE:  good               No peripheral edema. 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 Since her level was low normal on the last visit will check it  again Previously dose has been stable on 112 mcg  HYPERTENSION: The blood pressure is quite normal and she can continue the current dose  Smoking cessation: Discussed needing to cut back further, may be able to gain back some weight with this   Shalimar Mcclain 09/03/2013, 8:15 AM

## 2013-09-07 ENCOUNTER — Other Ambulatory Visit: Payer: Self-pay

## 2013-09-07 DIAGNOSIS — Z1231 Encounter for screening mammogram for malignant neoplasm of breast: Secondary | ICD-10-CM

## 2013-11-02 ENCOUNTER — Ambulatory Visit: Payer: Commercial Managed Care - PPO

## 2013-11-02 ENCOUNTER — Other Ambulatory Visit: Payer: Self-pay

## 2013-11-02 ENCOUNTER — Ambulatory Visit
Admission: RE | Admit: 2013-11-02 | Discharge: 2013-11-02 | Disposition: A | Discharge status: Home / Self Care | Payer: Medicare Other | Source: Ambulatory Visit

## 2013-11-02 DIAGNOSIS — Z1231 Encounter for screening mammogram for malignant neoplasm of breast: Secondary | ICD-10-CM

## 2013-11-09 ENCOUNTER — Telehealth: Payer: Self-pay | Admitting: Endocrinology

## 2013-11-09 NOTE — Telephone Encounter (Signed)
Unable to reach patient, no vm, no answer

## 2013-11-09 NOTE — Telephone Encounter (Signed)
Pt states she is having stomach issues and would like to speak with nurse please   Call back [phone]   Thank You :)

## 2013-12-15 ENCOUNTER — Other Ambulatory Visit: Payer: Self-pay | Admitting: Gastroenterology

## 2013-12-15 DIAGNOSIS — R109 Unspecified abdominal pain: Secondary | ICD-10-CM

## 2013-12-15 DIAGNOSIS — R634 Abnormal weight loss: Secondary | ICD-10-CM

## 2013-12-28 ENCOUNTER — Ambulatory Visit
Admission: RE | Admit: 2013-12-28 | Discharge: 2013-12-28 | Disposition: A | Discharge status: Home / Self Care | Payer: Medicare Other | Source: Ambulatory Visit | Attending: Gastroenterology | Admitting: Gastroenterology

## 2013-12-28 DIAGNOSIS — R634 Abnormal weight loss: Secondary | ICD-10-CM

## 2013-12-28 DIAGNOSIS — R109 Unspecified abdominal pain: Secondary | ICD-10-CM

## 2013-12-28 MED ORDER — IOHEXOL 300 MG/ML  SOLN
100.0000 mL | Freq: Once | INTRAMUSCULAR | Status: AC | PRN
  Administered 2013-12-28: 100 mL via INTRAVENOUS

## 2013-12-30 ENCOUNTER — Encounter: Payer: Self-pay | Admitting: Pulmonary Disease

## 2013-12-30 ENCOUNTER — Encounter: Payer: Self-pay | Admitting: *Deleted

## 2013-12-31 ENCOUNTER — Telehealth: Payer: Self-pay | Admitting: Pulmonary Disease

## 2013-12-31 ENCOUNTER — Encounter: Payer: Self-pay | Admitting: Pulmonary Disease

## 2013-12-31 ENCOUNTER — Ambulatory Visit (INDEPENDENT_AMBULATORY_CARE_PROVIDER_SITE_OTHER): Payer: 59 | Admitting: Pulmonary Disease

## 2013-12-31 VITALS — BP 118/76 | HR 76 | Ht 67.0 in | Wt 119.0 lb

## 2013-12-31 DIAGNOSIS — Z720 Tobacco use: Secondary | ICD-10-CM

## 2013-12-31 DIAGNOSIS — R911 Solitary pulmonary nodule: Secondary | ICD-10-CM

## 2013-12-31 DIAGNOSIS — F17200 Nicotine dependence, unspecified, uncomplicated: Secondary | ICD-10-CM

## 2013-12-31 NOTE — Addendum Note (Signed)
Addended by: Desmond Dike C on: 12/31/2013 09:53 AM   Modules accepted: Orders

## 2013-12-31 NOTE — Telephone Encounter (Signed)
Spoke with the pt  She states having labs done tomorrow at ov with Dr. Dwyane Dee She is asking if okay to have BMET Dr Halford Chessman ordered drawn then  I advised this is fine  Her order is already in the system  Nothing further needed per pt

## 2013-12-31 NOTE — Patient Instructions (Signed)
Will schedule CT chest and breathing test (PFT) >> will call with results Follow up in 6 months

## 2013-12-31 NOTE — Progress Notes (Deleted)
   Subjective:    Patient ID: Victoria Blair, female    DOB: 04-03-1946, 68 y.o.   MRN: [id number]  HPI    Review of Systems  Constitutional: Positive for unexpected weight change. Negative for fever.  HENT: Positive for ear pain and sore throat. Negative for congestion, dental problem, nosebleeds, postnasal drip, rhinorrhea, sinus pressure, sneezing and trouble swallowing.   Eyes: Negative for redness and itching.  Respiratory: Negative for cough, chest tightness, shortness of breath and wheezing.   Cardiovascular: Negative for palpitations and leg swelling.  Gastrointestinal: Negative for nausea and vomiting.  Genitourinary: Negative for dysuria.  Musculoskeletal: Negative for joint swelling.  Skin: Negative for rash.  Neurological: Negative for headaches.  Hematological: Does not bruise/bleed easily.  Psychiatric/Behavioral: Negative for dysphoric mood. The patient is not nervous/anxious.        Objective:   Physical Exam        Assessment & Plan:

## 2013-12-31 NOTE — Assessment & Plan Note (Signed)
She has incidental finding of left lung nodule on recent CT abdomen.  She has remote hx of breast cancer, and has hx of cigarette smoking.  To further assess with get full CT chest with contrast.

## 2013-12-31 NOTE — Progress Notes (Signed)
Chief Complaint  Patient presents with  . Pulmonary Consult    referred by Dr. Penelope Coop for LLL nodule    History of Present Illness: Victoria Blair is a 68 y.o. female smoker for evaluation of lung nodule.  She has been getting abdominal pain.  She was evaluated by Dr. Penelope Coop.  She had CT abd/pelvis, and was found to have nodule in LLL.    She has history of breast cancer in 1997.  This was treated with lumpectomy and radiation.  She started smoking at age 55.  She smoked up to 1/2 pack per day.  She now smokes a few cigarettes per day.  She denies cough, sputum, or hemoptysis.  She gets allergies, and sometimes gets wheezing in her throat when she has allergies.  She uses neti pot, flonase, and zyrtec.  She denies hx of asthma or COPD.  There is no hx of pneumonia or exposure to tuberculosis.  She denies difficulty with her breathing.  Victoria Blair  has a past medical history of GERD (gastroesophageal reflux disease); Osteoporosis; Breast cancer (1997); Hypertension; Vitamin D deficiency; Graves disease; Migraines; and Hemorrhoids.  Victoria Blair  has past surgical history that includes Breast lumpectomy; Cholecystectomy; Spine surgery; and Cesarean section.  Prior to Admission medications   Medication Sig Start Date End Date Taking? Authorizing Provider  amlodipine-benazepril (LOTREL) 2.5-10 MG per capsule Take 1 capsule by mouth daily. 05/28/13  Yes Elayne Snare, MD  Calcium Carbonate-Vitamin D (CALCIUM + D PO) Take 1 tablet by mouth daily.    Yes Historical Provider, MD  cetirizine (ZYRTEC) 10 MG tablet Take 10 mg by mouth daily as needed for allergies.   Yes Historical Provider, MD  fish oil-omega-3 fatty acids 1000 MG capsule Take 1 g by mouth daily.    Yes Historical Provider, MD  levothyroxine (SYNTHROID, LEVOTHROID) 88 MCG tablet Take 1 tablet (88 mcg total) by mouth daily. 09/03/13  Yes Elayne Snare, MD  Multiple Vitamin (MULTIVITAMIN) tablet Take 1 tablet by mouth daily.    Yes Historical Provider, MD  ranitidine (ZANTAC) 150 MG tablet Take 150 mg by mouth daily.   Yes Historical Provider, MD  VITAMIN E PO Take 1 tablet by mouth daily.   Yes Historical Provider, MD    No Known Allergies  Her family history includes Heart disease in her father; Pancreatic cancer in her sister; Pulmonary fibrosis in her father; Sleep apnea in her son.  She  reports that she has been smoking Cigarettes.  She has a 12 pack-year smoking history. She does not have any smokeless tobacco history on file. She reports that she drinks alcohol. She reports that she does not use illicit drugs.  Review of Systems  Constitutional: Positive for unexpected weight change. Negative for fever.  HENT: Positive for ear pain and sore throat. Negative for congestion, dental problem, nosebleeds, postnasal drip, rhinorrhea, sinus pressure, sneezing and trouble swallowing.   Eyes: Negative for redness and itching.  Respiratory: Negative for cough, chest tightness, shortness of breath and wheezing.   Cardiovascular: Negative for palpitations and leg swelling.  Gastrointestinal: Negative for nausea and vomiting.  Genitourinary: Negative for dysuria.  Musculoskeletal: Negative for joint swelling.  Skin: Negative for rash.  Neurological: Negative for headaches.  Hematological: Does not bruise/bleed easily.  Psychiatric/Behavioral: Negative for dysphoric mood. The patient is not nervous/anxious.    Physical Exam:  General - No distress, thin ENT - No sinus tenderness, no oral exudate, no LAN, no thyromegaly, TM clear, pupils  equal/reactive Cardiac - s1s2 regular, no murmur, pulses symmetric Chest - No wheeze/rales/dullness, good air entry, normal respiratory excursion Back - No focal tenderness Abd - Soft, non-tender, no organomegaly, + bowel sounds Ext - No edema Neuro - Normal strength, cranial nerves intact Skin - No rashes Psych - Normal mood, and behavior    Lab Results  Component Value  Date   WBC 7.3 12/09/2012   HGB 11.7* 12/09/2012   HCT 37.6* 12/09/2012   MCV 94.8 12/09/2012    Lab Results  Component Value Date   CREATININE 0.7 09/03/2013   BUN 17 09/03/2013   NA 140 09/03/2013   K 3.9 09/03/2013   CL 105 09/03/2013   CO2 30 09/03/2013    Lab Results  Component Value Date   ALT 20 05/28/2013   AST 24 05/28/2013   ALKPHOS 41 05/28/2013   BILITOT 0.5 05/28/2013    Lab Results  Component Value Date   TSH 0.08* 09/03/2013   Ct Abdomen Pelvis W Contrast  12/28/2013   CLINICAL DATA:  Left lower quadrant pain.  Weight loss constipation.  EXAM: CT ABDOMEN AND PELVIS WITH CONTRAST  TECHNIQUE: Multidetector CT imaging of the abdomen and pelvis was performed using the standard protocol following bolus administration of intravenous contrast.  CONTRAST:  136mL OMNIPAQUE IOHEXOL 300 MG/ML  SOLN  COMPARISON:  No comparison studies available.  FINDINGS: Lung Bases: 6 mm spiculated pulmonary nodule is identified in the left lower lobe on image 5.  Liver:  Normal.  Spleen:  Normal.  Stomach:  Not distended, but otherwise unremarkable.  Biliary: Is status post cholecystectomy. No intra or extrahepatic biliary duct dilatation.  Pancreas:  Unremarkable.  No pancreatic ductal dilatation.  Adrenal glands:  No adrenal nodule.  Kidneys and ureters: Tiny hypodensity in the lower pole of the right kidney is probably a cyst. 12 mm low-density lesion in the interpolar left kidney has attenuation too high to allow classification as a simple cyst. This may be a cyst complicated by proteinaceous debris or hemorrhage.  Retroperitoneum: No evidence for lymphadenopathy.  Mesentery: No mesenteric lymphadenopathy. No intraperitoneal free fluid.  Small bowel:  No evidence for small bowel obstruction.  Colon: Prominent stool volume. No substantial diverticular disease. No diverticulitis. The appendix is not visualized, but there is no edema or inflammation in the region of the cecum.  Pelvic Genitourinary:  Uterus is  unremarkable.  No adnexal mass.  Vasculature: Atherosclerotic calcification is noted in the wall of the abdominal aorta without aneurysm. Marland Kitchen  Bones: Bone windows reveal no worrisome lytic or sclerotic osseous lesions.  Body Wall: Unremarkable.  IMPRESSION: 1. 6 mm spiculated left lower lobe pulmonary nodule. While consensus guidelines would recommend CT follow-up in 6-12 months for a patient at high risk for primary bronchogenic carcinoma and 12 months in a patient at low risk for primary bronchogenic neoplasm, this is not a well-defined round pulmonary nodule. As such, repeat CT chest without contrast in 3-6 months is suggested. 2. 12 mm lesion in the left kidney may be a cyst, but has attenuation too high to allow classification as a simple cyst. MRI without and with contrast could be used to further evaluate.   Electronically Signed   By: Misty Stanley M.D.   On: 12/28/2013 10:12   Assessment/Plan:  Chesley Mires, MD [facility] Pager:  [phone]

## 2013-12-31 NOTE — Assessment & Plan Note (Signed)
Reviewed options to assist with smoking cessation.  Will arrange for PFT's to determine if she has early findings of COPD.

## 2014-01-01 ENCOUNTER — Other Ambulatory Visit (INDEPENDENT_AMBULATORY_CARE_PROVIDER_SITE_OTHER): Payer: 59

## 2014-01-01 ENCOUNTER — Telehealth: Payer: Self-pay | Admitting: Pulmonary Disease

## 2014-01-01 DIAGNOSIS — M810 Age-related osteoporosis without current pathological fracture: Secondary | ICD-10-CM

## 2014-01-01 DIAGNOSIS — M1990 Unspecified osteoarthritis, unspecified site: Secondary | ICD-10-CM

## 2014-01-01 DIAGNOSIS — E7800 Pure hypercholesterolemia, unspecified: Secondary | ICD-10-CM

## 2014-01-01 DIAGNOSIS — I10 Essential (primary) hypertension: Secondary | ICD-10-CM

## 2014-01-01 LAB — URINALYSIS, ROUTINE W REFLEX MICROSCOPIC
Bilirubin Urine: NEGATIVE
Hgb urine dipstick: NEGATIVE
Ketones, ur: NEGATIVE
LEUKOCYTES UA: NEGATIVE
Nitrite: NEGATIVE
SPECIFIC GRAVITY, URINE: 1.01 (ref 1.000–1.030)
Total Protein, Urine: NEGATIVE
UROBILINOGEN UA: 0.2 (ref 0.0–1.0)
Urine Glucose: NEGATIVE
pH: 7 (ref 5.0–8.0)

## 2014-01-01 LAB — COMPREHENSIVE METABOLIC PANEL
ALT: 21 U/L (ref 0–35)
AST: 24 U/L (ref 0–37)
Albumin: 3.9 g/dL (ref 3.5–5.2)
Alkaline Phosphatase: 46 U/L (ref 39–117)
BILIRUBIN TOTAL: 0.5 mg/dL (ref 0.2–1.2)
BUN: 20 mg/dL (ref 6–23)
CHLORIDE: 102 meq/L (ref 96–112)
CO2: 30 mEq/L (ref 19–32)
Calcium: 9.3 mg/dL (ref 8.4–10.5)
Creatinine, Ser: 0.7 mg/dL (ref 0.4–1.2)
GFR: 86.18 mL/min (ref >60.00)
Glucose, Bld: 92 mg/dL (ref 70–99)
Potassium: 4 mEq/L (ref 3.5–5.1)
SODIUM: 138 meq/L (ref 135–145)
TOTAL PROTEIN: 6.6 g/dL (ref 6.0–8.3)

## 2014-01-01 LAB — CBC
HCT: 37.5 % (ref 36.0–46.0)
Hemoglobin: 12.7 g/dL (ref 12.0–15.0)
MCHC: 33.8 g/dL (ref 30.0–36.0)
MCV: 93.7 fl (ref 78.0–100.0)
Platelets: 238 10*3/uL (ref 150.0–400.0)
RBC: 4 Mil/uL (ref 3.87–5.11)
RDW: 13.9 % (ref 11.5–15.5)
WBC: 5.2 10*3/uL (ref 4.0–10.5)

## 2014-01-01 LAB — LIPID PANEL
CHOL/HDL RATIO: 4
Cholesterol: 187 mg/dL (ref 0–200)
HDL: 50 mg/dL (ref >39.00)
LDL Cholesterol: 122 mg/dL — ABNORMAL HIGH (ref 0–99)
Triglycerides: 76 mg/dL (ref 0.0–149.0)
VLDL: 15.2 mg/dL (ref 0.0–40.0)

## 2014-01-01 NOTE — Telephone Encounter (Signed)
Spoke with patient--wanting to know what PFT is being ordered for and information pertaining to CT scheduled for 5.14.15 Explained to the patient the information below: Lung nodule - Chesley Mires, MD at 12/31/2013  9:47 AM      Status: Written Related Problem: Lung nodule    She has incidental finding of left lung nodule on recent CT abdomen.  She has remote hx of breast cancer, and has hx of cigarette smoking.   To further assess with get full CT chest with contrast.     Tobacco abuse - Chesley Mires, MD at 12/31/2013  9:48 AM      Status: Written Related Problem: Tobacco abuse    Reviewed options to assist with smoking cessation.   Will arrange for PFT's to determine if she has early findings of COPD.    Patient aware of appts date/times. No further information needed.

## 2014-01-02 LAB — VITAMIN D 25 HYDROXY (VIT D DEFICIENCY, FRACTURES): VIT D 25 HYDROXY: 49 ng/mL (ref 30–89)

## 2014-01-04 ENCOUNTER — Telehealth: Payer: Self-pay | Admitting: Pulmonary Disease

## 2014-01-04 NOTE — Telephone Encounter (Signed)
Spoke with pt. She reports Dr. Jeffie Pollock wants to scheduled an "MRI of kidney". Dr. Halford Chessman wants to schedule a CT scan. She asked Dr. Jeffie Pollock if the CT would show what they need to see and was advised no it wouldn't. Pt wants to know if Dr. Halford Chessman would be able to see what he is looking for on the test Dr. Jeffie Pollock wants her to have done. Please advise thanks

## 2014-01-04 NOTE — Telephone Encounter (Signed)
Unfortunately, MRI of abdomen will not show whether she has additional nodules in her lung.  She will need to have CT chest to further assess this.

## 2014-01-04 NOTE — Telephone Encounter (Signed)
I called made pt aware. Nothing further needed 

## 2014-01-05 ENCOUNTER — Ambulatory Visit: Payer: Commercial Managed Care - PPO | Admitting: Endocrinology

## 2014-01-05 ENCOUNTER — Ambulatory Visit (HOSPITAL_COMMUNITY)
Admission: RE | Admit: 2014-01-05 | Discharge: 2014-01-05 | Disposition: A | Discharge status: Home / Self Care | Payer: 59 | Source: Ambulatory Visit | Attending: Pulmonary Disease | Admitting: Pulmonary Disease

## 2014-01-05 DIAGNOSIS — F17200 Nicotine dependence, unspecified, uncomplicated: Secondary | ICD-10-CM | POA: Insufficient documentation

## 2014-01-05 DIAGNOSIS — R0989 Other specified symptoms and signs involving the circulatory and respiratory systems: Secondary | ICD-10-CM | POA: Insufficient documentation

## 2014-01-05 DIAGNOSIS — R911 Solitary pulmonary nodule: Secondary | ICD-10-CM

## 2014-01-05 DIAGNOSIS — Z720 Tobacco use: Secondary | ICD-10-CM

## 2014-01-05 DIAGNOSIS — R0609 Other forms of dyspnea: Secondary | ICD-10-CM | POA: Insufficient documentation

## 2014-01-05 DIAGNOSIS — J988 Other specified respiratory disorders: Secondary | ICD-10-CM | POA: Insufficient documentation

## 2014-01-05 MED ORDER — ALBUTEROL SULFATE (2.5 MG/3ML) 0.083% IN NEBU
2.5000 mg | INHALATION_SOLUTION | Freq: Once | RESPIRATORY_TRACT | Status: AC
  Administered 2014-01-05: 2.5 mg via RESPIRATORY_TRACT

## 2014-01-06 ENCOUNTER — Other Ambulatory Visit (INDEPENDENT_AMBULATORY_CARE_PROVIDER_SITE_OTHER): Payer: 59

## 2014-01-06 ENCOUNTER — Encounter: Payer: Self-pay | Admitting: Endocrinology

## 2014-01-06 ENCOUNTER — Encounter: Payer: Self-pay | Admitting: *Deleted

## 2014-01-06 ENCOUNTER — Ambulatory Visit (INDEPENDENT_AMBULATORY_CARE_PROVIDER_SITE_OTHER): Payer: 59 | Admitting: Endocrinology

## 2014-01-06 VITALS — BP 120/68 | HR 69 | Temp 97.8°F | Resp 12 | Ht 67.0 in | Wt 119.8 lb

## 2014-01-06 DIAGNOSIS — R911 Solitary pulmonary nodule: Secondary | ICD-10-CM

## 2014-01-06 DIAGNOSIS — E890 Postprocedural hypothyroidism: Secondary | ICD-10-CM

## 2014-01-06 DIAGNOSIS — I10 Essential (primary) hypertension: Secondary | ICD-10-CM

## 2014-01-06 DIAGNOSIS — E7800 Pure hypercholesterolemia, unspecified: Secondary | ICD-10-CM

## 2014-01-06 LAB — TSH: TSH: 2.58 u[IU]/mL (ref 0.35–4.50)

## 2014-01-06 LAB — BASIC METABOLIC PANEL
BUN: 18 mg/dL (ref 6–23)
CHLORIDE: 102 meq/L (ref 96–112)
CO2: 31 mEq/L (ref 19–32)
CREATININE: 0.6 mg/dL (ref 0.4–1.2)
Calcium: 9.5 mg/dL (ref 8.4–10.5)
GFR: 110.6 mL/min (ref >60.00)
Glucose, Bld: 89 mg/dL (ref 70–99)
Potassium: 4.6 mEq/L (ref 3.5–5.1)
Sodium: 139 mEq/L (ref 135–145)

## 2014-01-06 LAB — T4, FREE: FREE T4: 1.01 ng/dL (ref 0.60–1.60)

## 2014-01-06 NOTE — Progress Notes (Signed)
Patient ID: Victoria Blair, female   DOB: [birth year], 68 y.o.   MRN: [id number]   Reason for Appointment:   followup visit   History of Present Illness:   1. HYPOTHYROIDISM: This was first diagnosed in 1978 after I-131 treatment for Graves' disease The symptoms consistent with hypothyroidism are: Intermittent fatigue although this is not new or worse She does not complain of any unusual cord intolerance or hair loss  The treatments that the patient has taken include Synthroid.         On her last visit because of low TSH the dose was reduced to 88 mcg there also had been losing a little weight. Compliance with the medical regimen has been as prescribed with taking the tablet in the morning before breakfast.  Lab Results  Component Value Date   TSH 0.08* 09/03/2013    HYPERTENSION: She has been on treatment since 2009 and has been on Lotrel since then with good control.  Because of low normal blood pressure in 10/14 her Lotrel was reduced to 2.5/10. Blood pressure is quite normal now Does not monitor at home   Abdominal     Medication List       This list is accurate as of: 01/06/14  8:55 AM.  Always use your most recent med list.               amlodipine-benazepril 2.5-10 MG per capsule  Commonly known as:  LOTREL  Take 1 capsule by mouth daily.     CALCIUM + D PO  Take 1 tablet by mouth daily.     cetirizine 10 MG tablet  Commonly known as:  ZYRTEC  Take 10 mg by mouth daily as needed for allergies.     fish oil-omega-3 fatty acids 1000 MG capsule  Take 1 g by mouth daily.     levothyroxine 88 MCG tablet  Commonly known as:  SYNTHROID, LEVOTHROID  Take 1 tablet (88 mcg total) by mouth daily.     multivitamin tablet  Take 1 tablet by mouth daily.     ranitidine 150 MG tablet  Commonly known as:  ZANTAC  Take 150 mg by mouth daily.     VITAMIN E PO  Take 1 tablet by mouth daily.        Past Medical History  Diagnosis Date  . GERD (gastroesophageal  reflux disease)   . Osteoporosis   . Breast cancer 1997  . Hypertension   . Vitamin D deficiency   . Graves disease   . Migraines   . Hemorrhoids     Past Surgical History  Procedure Laterality Date  . Breast lumpectomy    . Cholecystectomy    . Spine surgery    . Cesarean section      Family History  Problem Relation Age of Onset  . Heart disease Father   . Pulmonary fibrosis Father   . Pancreatic cancer Sister   . Sleep apnea Son     Social History:  reports that she has been smoking Cigarettes.  She has a 12 pack-year smoking history. She does not have any smokeless tobacco history on file. She reports that she drinks alcohol. She reports that she does not use illicit drugs. Currently smoking only 2 cigarettes a day this week  Allergies:  No Known Allergies  REVIEW of systems:   Wt Readings from Last 3 Encounters:  01/06/14 119 lb 12.8 oz (54.341 kg)  12/31/13 119 lb (53.978 kg)  09/03/13 124 lb  1.6 oz (56.291 kg)   She has osteopenia; previously was given Actonel but stopped this because of difficulty with compliance. T score -1.7 at spine and -1.1 at left hip. Taking Evista since 5/09.   She has had discomfort in her thumb joints with enlargement  Not due for colonoscopy as yet She is getting further scans done for a nodule on her lung as well as a 12 mm lesion on her kidney   Examination:   BP 120/68  Pulse 69  Temp(Src) 97.8 F (36.6 C)  Resp 12  Ht 5\' 7"  (1.702 m)  Wt 119 lb 12.8 oz (54.341 kg)  BMI 18.76 kg/m2  SpO2 97%   GENERAL APPEARANCE:  good               No peripheral edema. 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

## 2014-01-06 NOTE — Progress Notes (Signed)
Quick Note:  Please let patient know that the thyroid result is normal and no change needed ______

## 2014-01-07 ENCOUNTER — Ambulatory Visit (INDEPENDENT_AMBULATORY_CARE_PROVIDER_SITE_OTHER)
Admission: RE | Admit: 2014-01-07 | Discharge: 2014-01-07 | Disposition: A | Discharge status: Home / Self Care | Payer: 59 | Source: Ambulatory Visit | Attending: Pulmonary Disease | Admitting: Pulmonary Disease

## 2014-01-07 DIAGNOSIS — R911 Solitary pulmonary nodule: Secondary | ICD-10-CM

## 2014-01-07 DIAGNOSIS — Z720 Tobacco use: Secondary | ICD-10-CM

## 2014-01-07 DIAGNOSIS — F17200 Nicotine dependence, unspecified, uncomplicated: Secondary | ICD-10-CM

## 2014-01-07 LAB — PULMONARY FUNCTION TEST
DL/VA % PRED: 94 %
DL/VA: 4.94 ml/min/mmHg/L
DLCO COR % PRED: 66 %
DLCO COR: 19.75 ml/min/mmHg
DLCO unc % pred: 65 %
DLCO unc: 19.31 ml/min/mmHg
FEF 25-75 PRE: 1.38 L/s
FEF 25-75 Post: 1.18 L/sec
FEF2575-%Change-Post: -14 %
FEF2575-%Pred-Post: 50 %
FEF2575-%Pred-Pre: 59 %
FEV1-%CHANGE-POST: -5 %
FEV1-%PRED-POST: 61 %
FEV1-%PRED-PRE: 64 %
FEV1-POST: 1.7 L
FEV1-PRE: 1.8 L
FEV1FVC-%CHANGE-POST: -3 %
FEV1FVC-%PRED-PRE: 95 %
FEV6-%Change-Post: -2 %
FEV6-%Pred-Post: 67 %
FEV6-%Pred-Pre: 69 %
FEV6-PRE: 2.42 L
FEV6-Post: 2.36 L
FEV6FVC-%Change-Post: 0 %
FEV6FVC-%PRED-PRE: 103 %
FEV6FVC-%Pred-Post: 104 %
FVC-%Change-Post: -2 %
FVC-%Pred-Post: 65 %
FVC-%Pred-Pre: 67 %
FVC-Post: 2.4 L
FVC-Pre: 2.45 L
POST FEV6/FVC RATIO: 100 %
Post FEV1/FVC ratio: 71 %
Pre FEV1/FVC ratio: 74 %
Pre FEV6/FVC Ratio: 100 %
RV % pred: 95 %
RV: 2.2 L
TLC % PRED: 78 %
TLC: 4.44 L

## 2014-01-07 MED ORDER — IOHEXOL 300 MG/ML  SOLN
80.0000 mL | Freq: Once | INTRAMUSCULAR | Status: AC | PRN
  Administered 2014-01-07: 80 mL via INTRAVENOUS

## 2014-01-09 ENCOUNTER — Other Ambulatory Visit: Payer: Self-pay | Admitting: Endocrinology

## 2014-01-11 ENCOUNTER — Telehealth: Payer: Self-pay | Admitting: Pulmonary Disease

## 2014-01-11 DIAGNOSIS — R918 Other nonspecific abnormal finding of lung field: Secondary | ICD-10-CM

## 2014-01-11 NOTE — Telephone Encounter (Signed)
Pt returning call.Victoria Blair ° °

## 2014-01-11 NOTE — Telephone Encounter (Signed)
lmomtcb x1 for pt 

## 2014-01-11 NOTE — Telephone Encounter (Signed)
Patient states she is returning call and if she doesn't answer can leave detailed message on VM.

## 2014-01-11 NOTE — Telephone Encounter (Signed)
Nothing further to add.

## 2014-01-11 NOTE — Telephone Encounter (Signed)
PFT 01/05/14 >> FEV1 1.80 (64%), FEV1% 74, TLC 4.44 (78%), DLCO 65%   01/07/2014    CLINICAL DATA:  Evaluate lung nodule seen recent CT abdomen pelvis   EXAM: CT CHEST WITH CONTRAST   TECHNIQUE: Multidetector CT imaging of the chest was performed during intravenous contrast administration.   CONTRAST:  64mL OMNIPAQUE IOHEXOL 300 MG/ML  SOLN COMPARISON:  DG CHEST 2V dated 12/09/2012; CT ABD/PELVIS W CM dated 12/28/2013   FINDINGS:  There is no significant hilar or mediastinal adenopathy. There is calcification of the thoracic aorta. There is no pleural or pericardial effusion.  The spiculated mass in the left lower lobe is again present at 6 mm, but appears partially resolved and a of lower conspicuity and attenuation. This is seen on image number 38. On image 31 in the left lower lobe there is a 5 mm pulmonary nodule and on image 30 in the left lower lobe there is a partially calcified 5 mm nodule.  Images to the upper abdomen show fatty infiltration of the liver. The known low-attenuation sub cm left renal lesion is identified.  There are no acute musculoskeletal findings. IMPRESSION:  Three left-sided pulmonary nodules as described above the largest measuring 6 mm but showing relative decrease in conspicuity and attenuation. The other 2 pulmonary nodules both measure 5 mm, 1 being partially calcified and the other being relatively circumscribed.   If the patient is at high risk for bronchogenic carcinoma, follow-up chest CT at 6-12 months is recommended. If the patient is at low risk for bronchogenic carcinoma, follow-up chest CT at 12 months is recommended.  This recommendation follows the consensus statement: Guidelines for Management of Small Pulmonary Nodules Detected on CT Scans: A Statement from the [employer] as published in Radiology 2005;237:395-400.    Electronically Signed   By: Skipper Cliche M.D.   On: 01/07/2014 09:46   Left message on pt voicemail.  No COPD on PFT.  Will need  f/u CT chest w/o contrast in November 2015 >> order placed for this.

## 2014-01-11 NOTE — Telephone Encounter (Signed)
Called spoke with pt. Still awaiting results from VS. She voiced her understanding. Please advise Dr. Halford Chessman thanks

## 2014-01-11 NOTE — Telephone Encounter (Signed)
Pt had BMET done 01/06/14 and CT 01/07/14. Please advise VS thanks

## 2014-01-15 ENCOUNTER — Other Ambulatory Visit: Payer: Self-pay | Admitting: Urology

## 2014-01-15 DIAGNOSIS — N281 Cyst of kidney, acquired: Secondary | ICD-10-CM

## 2014-01-25 ENCOUNTER — Ambulatory Visit (HOSPITAL_COMMUNITY): Admission: RE | Admit: 2014-01-25 | Payer: 59 | Source: Ambulatory Visit

## 2014-02-03 ENCOUNTER — Ambulatory Visit (HOSPITAL_COMMUNITY)
Admission: RE | Admit: 2014-02-03 | Discharge: 2014-02-03 | Disposition: A | Discharge status: Home / Self Care | Payer: 59 | Source: Ambulatory Visit | Attending: Urology | Admitting: Urology

## 2014-02-03 DIAGNOSIS — N281 Cyst of kidney, acquired: Secondary | ICD-10-CM | POA: Insufficient documentation

## 2014-02-03 DIAGNOSIS — N289 Disorder of kidney and ureter, unspecified: Secondary | ICD-10-CM | POA: Insufficient documentation

## 2014-02-03 DIAGNOSIS — Z9089 Acquired absence of other organs: Secondary | ICD-10-CM | POA: Insufficient documentation

## 2014-02-03 DIAGNOSIS — R1032 Left lower quadrant pain: Secondary | ICD-10-CM | POA: Insufficient documentation

## 2014-02-03 MED ORDER — GADOBENATE DIMEGLUMINE 529 MG/ML IV SOLN
10.0000 mL | Freq: Once | INTRAVENOUS | Status: AC | PRN
  Administered 2014-02-03: 10 mL via INTRAVENOUS

## 2014-02-26 ENCOUNTER — Other Ambulatory Visit: Payer: Self-pay | Admitting: Endocrinology

## 2014-03-01 NOTE — Telephone Encounter (Signed)
Patient would like for know if she need to have her thyroid checked, please advise

## 2014-05-10 ENCOUNTER — Ambulatory Visit: Payer: Medicare Other | Admitting: Endocrinology

## 2014-05-18 ENCOUNTER — Encounter: Payer: Self-pay | Admitting: Endocrinology

## 2014-05-18 ENCOUNTER — Ambulatory Visit (INDEPENDENT_AMBULATORY_CARE_PROVIDER_SITE_OTHER): Payer: 59 | Admitting: Endocrinology

## 2014-05-18 VITALS — BP 129/70 | HR 78 | Temp 97.7°F | Resp 14 | Ht 67.0 in | Wt 116.6 lb

## 2014-05-18 DIAGNOSIS — Z23 Encounter for immunization: Secondary | ICD-10-CM

## 2014-05-18 DIAGNOSIS — M949 Disorder of cartilage, unspecified: Secondary | ICD-10-CM

## 2014-05-18 DIAGNOSIS — I10 Essential (primary) hypertension: Secondary | ICD-10-CM

## 2014-05-18 DIAGNOSIS — M8580 Other specified disorders of bone density and structure, unspecified site: Secondary | ICD-10-CM

## 2014-05-18 DIAGNOSIS — E890 Postprocedural hypothyroidism: Secondary | ICD-10-CM

## 2014-05-18 DIAGNOSIS — M899 Disorder of bone, unspecified: Secondary | ICD-10-CM

## 2014-05-18 DIAGNOSIS — E7800 Pure hypercholesterolemia, unspecified: Secondary | ICD-10-CM

## 2014-05-18 LAB — BASIC METABOLIC PANEL
BUN: 15 mg/dL (ref 6–23)
CHLORIDE: 103 meq/L (ref 96–112)
CO2: 29 meq/L (ref 19–32)
CREATININE: 0.7 mg/dL (ref 0.4–1.2)
Calcium: 9.2 mg/dL (ref 8.4–10.5)
GFR: 86.08 mL/min (ref >60.00)
Glucose, Bld: 86 mg/dL (ref 70–99)
Potassium: 4 mEq/L (ref 3.5–5.1)
Sodium: 137 mEq/L (ref 135–145)

## 2014-05-18 LAB — TSH: TSH: 0.59 u[IU]/mL (ref 0.35–4.50)

## 2014-05-18 NOTE — Progress Notes (Signed)
Quick Note:  Please let patient know that the lab result is normal and no further action needed ______ 

## 2014-05-18 NOTE — Progress Notes (Signed)
Patient ID: Victoria Blair, female   DOB: [birth year], 68 y.o.   MRN: [id number]   Reason for Appointment:   followup visit   History of Present Illness:   1. HYPOTHYROIDISM: This was first diagnosed in 1978 after I-131 treatment for Graves' disease The symptoms consistent with hypothyroidism are:  fatigue although this is not new or worse, she thinks this is from her busy work schedule She does not complain of any cold intolerance, weight gain or hair loss  The treatments that the patient has taken include Synthroid.         Previously in 1/15 because of low TSH the dose was reduced to 88 mcg  She did not feel any different with this change Has not had TSH done recently Compliance with the medical regimen has been as prescribed with taking the tablet in the morning before breakfast.  Lab Results  Component Value Date   FREET4 1.01 01/06/2014   FREET4 1.51 09/03/2013   FREET4 1.54 05/28/2013   TSH 2.58 01/06/2014   TSH 0.08* 09/03/2013   TSH 0.46 05/28/2013  '  HYPERTENSION: She has been on treatment since 2009 and has been on Lotrel since then with good control.  Because of low normal blood pressure in 10/14 her Lotrel was reduced to 2.5/10.  Blood pressure is quite normal now Does not monitor at home        Medication List       This list is accurate as of: 05/18/14 11:33 AM.  Always use your most recent med list.               amlodipine-benazepril 2.5-10 MG per capsule  Commonly known as:  LOTREL  TAKE ONE CAPSULE BY MOUTH EVERY DAY     CALCIUM + D PO  Take 1 tablet by mouth daily.     cetirizine 10 MG tablet  Commonly known as:  ZYRTEC  Take 10 mg by mouth daily as needed for allergies.     fish oil-omega-3 fatty acids 1000 MG capsule  Take 1 g by mouth daily.     levothyroxine 88 MCG tablet  Commonly known as:  SYNTHROID, LEVOTHROID  TAKE 1 TABLET (88 MCG TOTAL) BY MOUTH DAILY.     multivitamin tablet  Take 1 tablet by mouth daily.     ranitidine 150 MG  tablet  Commonly known as:  ZANTAC  Take 150 mg by mouth daily.     VITAMIN E PO  Take 1 tablet by mouth daily.        Past Medical History  Diagnosis Date  . GERD (gastroesophageal reflux disease)   . Osteoporosis   . Breast cancer 1997  . Hypertension   . Vitamin D deficiency   . Graves disease   . Migraines   . Hemorrhoids     Past Surgical History  Procedure Laterality Date  . Breast lumpectomy    . Cholecystectomy    . Spine surgery    . Cesarean section      Family History  Problem Relation Age of Onset  . Heart disease Father   . Pulmonary fibrosis Father   . Pancreatic cancer Sister   . Sleep apnea Son     Social History:  reports that she has been smoking Cigarettes.  She has a 12 pack-year smoking history. She does not have any smokeless tobacco history on file. She reports that she drinks alcohol. She reports that she does not use illicit drugs. Currently smoking  off and On 2 a day    Allergies:  No Known Allergies  REVIEW of systems:   Wt Readings from Last 3 Encounters:  05/18/14 116 lb 9.6 oz (52.889 kg)  01/06/14 119 lb 12.8 oz (54.341 kg)  12/31/13 119 lb (53.978 kg)   She has osteopenia; previously was given Actonel but stopped this because of difficulty with compliance. T score -1.7 at spine and -1.1 at left hip.  Taking Evista since 5/09 without side effects.   Not due for colonoscopy as yet  She is followed by pulmonologist for small nodules on her CT scan  Hyperlipidemia: She has mild increase in LDL, does not have any other risk factors to need statin treatment   Examination:   BP 129/70  Pulse 78  Temp(Src) 97.7 F (36.5 C)  Resp 14  Ht 5\' 7"  (1.702 m)  Wt 116 lb 9.6 oz (52.889 kg)  BMI 18.26 kg/m2  SpO2 99%   GENERAL APPEARANCE:  looks well              No peripheral edema. Neck exam shows no thyroid enlargement or adenopathy Biceps reflexes normal    Assessments / PLAN:  Hypothyroidism, post ablative,  long-standing TSH will need to be checked again Subjectively difficult to assess her hypothyroidism since she tends to have fatigue related to her work stress She prefers to take generic preparation  HYPERTENSION: The blood pressure is well-controlled and she can continue the current dose of Lotrel  Smoking cessation: She appears somewhat motivated to quit but may be able to do so once her stress is less   Victoria Blair 05/18/2014, 11:33 AM

## 2014-06-09 ENCOUNTER — Other Ambulatory Visit (INDEPENDENT_AMBULATORY_CARE_PROVIDER_SITE_OTHER): Payer: 59

## 2014-06-09 DIAGNOSIS — E7800 Pure hypercholesterolemia, unspecified: Secondary | ICD-10-CM

## 2014-06-09 DIAGNOSIS — I10 Essential (primary) hypertension: Secondary | ICD-10-CM

## 2014-06-09 DIAGNOSIS — M8580 Other specified disorders of bone density and structure, unspecified site: Secondary | ICD-10-CM

## 2014-06-09 DIAGNOSIS — E890 Postprocedural hypothyroidism: Secondary | ICD-10-CM

## 2014-06-09 LAB — TSH: TSH: 0.56 u[IU]/mL (ref 0.35–4.50)

## 2014-06-09 LAB — LIPID PANEL
Cholesterol: 173 mg/dL (ref 0–200)
HDL: 41.2 mg/dL (ref >39.00)
LDL Cholesterol: 117 mg/dL — ABNORMAL HIGH (ref 0–99)
NonHDL: 131.8
Total CHOL/HDL Ratio: 4
Triglycerides: 72 mg/dL (ref 0.0–149.0)
VLDL: 14.4 mg/dL (ref 0.0–40.0)

## 2014-06-09 LAB — URINALYSIS, ROUTINE W REFLEX MICROSCOPIC
Bilirubin Urine: NEGATIVE
Hgb urine dipstick: NEGATIVE
Ketones, ur: NEGATIVE
Leukocytes, UA: NEGATIVE
Nitrite: NEGATIVE
RBC / HPF: NONE SEEN (ref 0–?)
Specific Gravity, Urine: 1.015 (ref 1.000–1.030)
TOTAL PROTEIN, URINE-UPE24: NEGATIVE
URINE GLUCOSE: NEGATIVE
Urobilinogen, UA: 0.2 (ref 0.0–1.0)
WBC, UA: NONE SEEN (ref 0–?)
pH: 7 (ref 5.0–8.0)

## 2014-06-09 LAB — CBC
HCT: 37.4 % (ref 36.0–46.0)
Hemoglobin: 12.3 g/dL (ref 12.0–15.0)
MCHC: 32.8 g/dL (ref 30.0–36.0)
MCV: 94.9 fl (ref 78.0–100.0)
PLATELETS: 244 10*3/uL (ref 150.0–400.0)
RBC: 3.94 Mil/uL (ref 3.87–5.11)
RDW: 13.7 % (ref 11.5–15.5)
WBC: 4.4 10*3/uL (ref 4.0–10.5)

## 2014-06-09 LAB — COMPREHENSIVE METABOLIC PANEL
ALT: 19 U/L (ref 0–35)
AST: 22 U/L (ref 0–37)
Albumin: 3.5 g/dL (ref 3.5–5.2)
Alkaline Phosphatase: 44 U/L (ref 39–117)
BILIRUBIN TOTAL: 0.6 mg/dL (ref 0.2–1.2)
BUN: 15 mg/dL (ref 6–23)
CO2: 30 meq/L (ref 19–32)
Calcium: 9 mg/dL (ref 8.4–10.5)
Chloride: 102 mEq/L (ref 96–112)
Creatinine, Ser: 0.7 mg/dL (ref 0.4–1.2)
GFR: 93.52 mL/min (ref >60.00)
GLUCOSE: 88 mg/dL (ref 70–99)
POTASSIUM: 3.7 meq/L (ref 3.5–5.1)
Sodium: 138 mEq/L (ref 135–145)
TOTAL PROTEIN: 6.9 g/dL (ref 6.0–8.3)

## 2014-06-09 LAB — T4, FREE: Free T4: 1.26 ng/dL (ref 0.60–1.60)

## 2014-06-10 ENCOUNTER — Other Ambulatory Visit: Payer: Medicare Other

## 2014-06-15 ENCOUNTER — Encounter: Payer: Self-pay | Admitting: Endocrinology

## 2014-06-15 ENCOUNTER — Ambulatory Visit (INDEPENDENT_AMBULATORY_CARE_PROVIDER_SITE_OTHER): Payer: 59 | Admitting: Endocrinology

## 2014-06-15 VITALS — BP 116/70 | HR 78 | Temp 97.8°F | Resp 14 | Ht 67.0 in | Wt 117.0 lb

## 2014-06-15 DIAGNOSIS — E890 Postprocedural hypothyroidism: Secondary | ICD-10-CM

## 2014-06-15 DIAGNOSIS — M159 Polyosteoarthritis, unspecified: Secondary | ICD-10-CM

## 2014-06-15 DIAGNOSIS — M150 Primary generalized (osteo)arthritis: Secondary | ICD-10-CM

## 2014-06-15 DIAGNOSIS — I10 Essential (primary) hypertension: Secondary | ICD-10-CM

## 2014-06-15 DIAGNOSIS — M8949 Other hypertrophic osteoarthropathy, multiple sites: Secondary | ICD-10-CM

## 2014-06-15 MED ORDER — MELOXICAM 7.5 MG PO TABS: 7.5000 mg | ORAL_TABLET | Freq: Every day | ORAL | Status: DC

## 2014-06-15 NOTE — Patient Instructions (Signed)
Meloxicam with food as needed

## 2014-06-15 NOTE — Progress Notes (Signed)
Patient ID: Victoria Blair, female   DOB: September 17, 1945, 68 y.o.   MRN: [id number]   Reason for Appointment:   followup visit   History of Present Illness:   1. HYPOTHYROIDISM: This was first diagnosed in 1978 after I-131 treatment for Graves' disease The symptoms consistent with hypothyroidism are:  fatigue which she thinks is more when she is stressed and also has busy work schedule She does not complain of any cold intolerance  or hair loss  The treatments that the patient has taken include generic Synthroid.   More recently has been taking the generic medication         In 1/15 because of low TSH the dose was reduced to 88 mcg and her TSH appears to be consistent now with this dose even with the generic preparation now  Compliance with the medical regimen has been as prescribed with taking the tablet in the morning before breakfast.  Lab Results  Component Value Date   FREET4 1.26 06/09/2014   FREET4 1.01 01/06/2014   FREET4 1.51 09/03/2013   TSH 0.56 06/09/2014   TSH 0.59 05/18/2014   TSH 2.58 01/06/2014  '  HYPERTENSION: She has been on treatment since 2009 and has been on Lotrel since then with good control.  Because of low normal blood pressure in 10/14 her Lotrel was reduced to 2.5/10.  Blood pressure is quite normal now and she does not feel lightheaded Does not monitor at home  Joint pain: She continues to have periodic pain in her hands especially the thumb and second finger This is somewhat more in the last few days in the left side She takes OTC herbal preparations and did not get enough relief She does not want to try a local cortisone injection again as it did not seem to help much previously      Medication List       This list is accurate as of: 06/15/14 10:37 AM.  Always use your most recent med list.               amlodipine-benazepril 2.5-10 MG per capsule  Commonly known as:  LOTREL  TAKE ONE CAPSULE BY MOUTH EVERY DAY     CALCIUM + D PO  Take 1  tablet by mouth daily.     cetirizine 10 MG tablet  Commonly known as:  ZYRTEC  Take 10 mg by mouth daily as needed for allergies.     fish oil-omega-3 fatty acids 1000 MG capsule  Take 1 g by mouth daily.     levothyroxine 88 MCG tablet  Commonly known as:  SYNTHROID, LEVOTHROID  TAKE 1 TABLET (88 MCG TOTAL) BY MOUTH DAILY.     multivitamin tablet  Take 1 tablet by mouth daily.     ranitidine 150 MG tablet  Commonly known as:  ZANTAC  Take 150 mg by mouth daily.     VITAMIN E PO  Take 1 tablet by mouth daily.        Past Medical History  Diagnosis Date  . GERD (gastroesophageal reflux disease)   . Osteoporosis   . Breast cancer 1997  . Hypertension   . Vitamin D deficiency   . Graves disease   . Migraines   . Hemorrhoids     Past Surgical History  Procedure Laterality Date  . Breast lumpectomy    . Cholecystectomy    . Spine surgery    . Cesarean section      Family History  Problem  Relation Age of Onset  . Heart disease Father   . Pulmonary fibrosis Father   . Pancreatic cancer Sister   . Sleep apnea Son     Social History:  reports that she has been smoking Cigarettes.  She has a 12 pack-year smoking history. She does not have any smokeless tobacco history on file. She reports that she drinks alcohol. She reports that she does not use illicit drugs. Currently smoking off and On 2 a day    Allergies:  No Known Allergies  REVIEW of systems:   Wt Readings from Last 3 Encounters:  05/18/14 116 lb 9.6 oz (52.889 kg)  01/06/14 119 lb 12.8 oz (54.341 kg)  12/31/13 119 lb (53.978 kg)   She has osteopenia; previously was given Actonel but stopped this because of difficulty with compliance. T score -1.7 at spine and -1.1 at left hip.  Taking Evista since 5/09 without side effects.   Sees GI, asked to stop fish oil because of her GI problems, apparently has IBS  She is followed by pulmonologist for small nodules on her CT scan, is getting another one  soon  Hyperlipidemia: She has mild increase in LDL, does not have any other risk factors to need statin treatment; recent level somewhat better  No anemia present   Examination:   BP 116/70  Pulse 78  Temp(Src) 97.8 F (36.6 C)  Resp 14  Ht 5\' 7"  (1.702 m)  Wt 117 lb (53.071 kg)  BMI 18.32 kg/m2  SpO2 99%  Repeat blood pressure 120/66 standing   GENERAL APPEARANCE:  looks well              No peripheral edema. She has significant swelling of the first and second fingers especially the right side but no significant inflammatory signs Biceps reflexes normal    Assessments / PLAN:  Hypothyroidism, post ablative, long-standing TSH is stable on generic medication  Osteoarthritis of hands: She agrees to try meloxicam 7.5 mg as needed, advised her to take it only as necessary and with food  HYPERTENSION: The blood pressure is well-controlled without orthostasis and she can continue the current dose of Lotrel  Smoking cessation: She appears somewhat motivated to quit but may be able to do so once her stress is less   Victoria Blair 06/15/2014, 10:37 AM

## 2014-07-11 ENCOUNTER — Other Ambulatory Visit: Payer: Self-pay | Admitting: Endocrinology

## 2014-07-13 ENCOUNTER — Telehealth: Payer: Self-pay | Admitting: Pulmonary Disease

## 2014-07-13 NOTE — Telephone Encounter (Signed)
Pt was to have f/u CT chest w/o contrast to be done earlier this month.  Will have my nurse contact pt to ensure CT chest gets rescheduled to f/u for pulmonary nodules.

## 2014-07-14 ENCOUNTER — Inpatient Hospital Stay: Admission: RE | Admit: 2014-07-14 | Payer: Medicare Other | Source: Ambulatory Visit

## 2014-07-14 NOTE — Telephone Encounter (Signed)
LMTCB x 1 

## 2014-07-15 ENCOUNTER — Ambulatory Visit (INDEPENDENT_AMBULATORY_CARE_PROVIDER_SITE_OTHER)
Admission: RE | Admit: 2014-07-15 | Discharge: 2014-07-15 | Disposition: A | Discharge status: Home / Self Care | Payer: 59 | Source: Ambulatory Visit | Attending: Pulmonary Disease | Admitting: Pulmonary Disease

## 2014-07-15 ENCOUNTER — Telehealth: Payer: Self-pay | Admitting: Pulmonary Disease

## 2014-07-15 DIAGNOSIS — R918 Other nonspecific abnormal finding of lung field: Secondary | ICD-10-CM | POA: Diagnosis not present

## 2014-07-15 NOTE — Telephone Encounter (Signed)
CT chest 07/14/14 >> biapical scarring, 6 mm nodule LLL, 5 mm nodule LLL  Will have my nurse inform pt that CT chest shows stabile appearance of lung nodules.  She will need next available ROV to review CT chest results in more detail and discuss follow up plan.

## 2014-07-15 NOTE — Telephone Encounter (Signed)
Chesley Mires, MD at 07/15/2014 10:02 AM     Status: Signed       Expand All Collapse All   CT chest 07/14/14 >> biapical scarring, 6 mm nodule LLL, 5 mm nodule LLL  Will have my nurse inform pt that CT chest shows stabile appearance of lung nodules. She will need next available ROV to review CT chest results in more detail and discuss follow up plan.

## 2014-07-15 NOTE — Telephone Encounter (Signed)
Pt returned call - [phone]

## 2014-07-15 NOTE — Telephone Encounter (Signed)
Pt is returning [name]'s call & can be reached at [phone].  Victoria Blair

## 2014-07-20 NOTE — Telephone Encounter (Signed)
Called and left message on pt's VM of the results and for her to call back to schedule f/u with VS.   Will need OV with VS.

## 2014-07-20 NOTE — Telephone Encounter (Signed)
[phone] leave a msg

## 2014-07-20 NOTE — Telephone Encounter (Signed)
LMTCB x 1 

## 2014-07-20 NOTE — Telephone Encounter (Signed)
Patient returning call.  Patient states you can leave a detailed message on machine.  [phone]

## 2014-07-20 NOTE — Telephone Encounter (Signed)
Pt is returning [name]'s call & can be reached at [phone].  Victoria Blair

## 2014-07-20 NOTE — Telephone Encounter (Signed)
LMOM with results per pt request.  - Need to schedule pt f/u with Dr Halford Chessman to review results when she calls back.

## 2014-07-23 NOTE — Telephone Encounter (Signed)
Results have been explained to patient, pt expressed understanding. Nothing further needed. Pt already scheduled for OV 09/13/14 at 3:15 with VS Please advise Dr Halford Chessman if this appt is okay or if needs to be sooner. Thanks.

## 2014-07-26 NOTE — Telephone Encounter (Signed)
Appt for 09/13/14 okay.

## 2014-07-26 NOTE — Telephone Encounter (Signed)
[email or web address].[email or web address] Castillo, CMA  

## 2014-07-27 NOTE — Telephone Encounter (Signed)
Left detailed message on machine to keep f/u with Dr Halford Chessman on 09/13/14 at 3:15.

## 2014-07-27 NOTE — Telephone Encounter (Signed)
Patient is returning call  [phone]

## 2014-08-14 ENCOUNTER — Other Ambulatory Visit: Payer: Self-pay | Admitting: Endocrinology

## 2014-08-23 ENCOUNTER — Telehealth: Payer: Self-pay | Admitting: Endocrinology

## 2014-08-23 NOTE — Telephone Encounter (Signed)
lmtcb

## 2014-08-23 NOTE — Telephone Encounter (Signed)
Patient Is taking Amlodipine- benazepril she, dosen't know how it is working because she is taking so many other meds. Please advise

## 2014-08-23 NOTE — Telephone Encounter (Signed)
Please read note below

## 2014-08-24 ENCOUNTER — Other Ambulatory Visit: Payer: Self-pay | Admitting: *Deleted

## 2014-08-24 ENCOUNTER — Telehealth: Payer: Self-pay | Admitting: *Deleted

## 2014-08-24 MED ORDER — ACYCLOVIR 5 % EX OINT: 1.0000 "application " | TOPICAL_OINTMENT | Freq: Two times a day (BID) | CUTANEOUS | Status: DC

## 2014-08-24 NOTE — Telephone Encounter (Signed)
Noted, patient is aware, rx sent 

## 2014-08-24 NOTE — Telephone Encounter (Signed)
No, can Try Zovirax bid 3g tube

## 2014-08-24 NOTE — Telephone Encounter (Signed)
Patient wants to know if the amlodipine she is taking would cause her mouth to break out in fever blisters and make her feel bad?      CB# [phone]

## 2014-08-25 ENCOUNTER — Other Ambulatory Visit: Payer: Self-pay | Admitting: *Deleted

## 2014-08-25 MED ORDER — FLUTICASONE PROPIONATE 50 MCG/ACT NA SUSP: 2.0000 | Freq: Every day | NASAL | Status: DC

## 2014-08-27 ENCOUNTER — Other Ambulatory Visit: Payer: Self-pay | Admitting: Endocrinology

## 2014-09-02 ENCOUNTER — Other Ambulatory Visit: Payer: Self-pay | Admitting: Endocrinology

## 2014-09-06 ENCOUNTER — Telehealth: Payer: Self-pay | Admitting: Endocrinology

## 2014-09-06 NOTE — Telephone Encounter (Signed)
Patient would like to know which Insurance to get HMO or PPO Please advise

## 2014-09-06 NOTE — Telephone Encounter (Signed)
We can't

## 2014-09-06 NOTE — Telephone Encounter (Signed)
How am I suppose to answer this?

## 2014-09-13 ENCOUNTER — Ambulatory Visit (INDEPENDENT_AMBULATORY_CARE_PROVIDER_SITE_OTHER): Payer: 59 | Admitting: Pulmonary Disease

## 2014-09-13 ENCOUNTER — Encounter: Payer: Self-pay | Admitting: Pulmonary Disease

## 2014-09-13 VITALS — BP 124/62 | HR 85 | Temp 98.1°F | Ht 67.0 in | Wt 118.0 lb

## 2014-09-13 DIAGNOSIS — J438 Other emphysema: Secondary | ICD-10-CM

## 2014-09-13 DIAGNOSIS — R918 Other nonspecific abnormal finding of lung field: Secondary | ICD-10-CM

## 2014-09-13 DIAGNOSIS — Z720 Tobacco use: Secondary | ICD-10-CM

## 2014-09-13 NOTE — Progress Notes (Signed)
Chief Complaint  Patient presents with  . Follow-up    Pt here for 6 month f/u. Pt c/o prod cough with clear to white mucus, mild SOB only when coughing and chest tightness when lying down. Pt stated she is currently taking abx. Pt states her s/s have improved since taking abx.    History of Present Illness: Victoria Blair is a 69 y.o. female smoker with lung nodule.  She continues to smoke 5 cigarettes per day.  She was doing okay with her breathing until around Christmas time.  She had hives and then had cough with sputum.  She was also having chills.  She was started on amoxicillin and has been feeling better.  She has also been using flonase.  TESTS: PFT 01/05/14 >> FEV1 1.80 (64%), FEV1% 74, TLC 4.44 (78%), DLCO 65% CT chest 01/07/14 >> biapical scarring, 6 mm nodule LLL, 5 mm nodule LLL CT chest 07/14/14 >> biapical scarring, 6 mm nodule LLL, 5 mm nodule LLL  Past medical hx >> GERD, Breast cancer 1997, Osteoporosis, HTN, Graves disease, Migraines  Past surgical hx, Medications, Allergies, Family hx, Social hx all reviewed.   Physical Exam: Blood pressure 124/62, pulse 85, temperature 98.1 F (36.7 C), temperature source Oral, height 5\' 7"  (1.702 m), weight 118 lb (53.524 kg), SpO2 100 %. Body mass index is 18.48 kg/(m^2).  General - No distress ENT - No sinus tenderness, no oral exudate, no LAN Cardiac - s1s2 regular, no murmur Chest - No wheeze/rales/dullness Back - No focal tenderness Abd - Soft, non-tender Ext - No edema Neuro - Normal strength Skin - No rashes Psych - normal mood, and behavior   Assessment/Plan:  Pulmonary nodules. Plan: - she will need f/u CT chest w/o contrast for May 2016  Tobacco abuse. Plan: - she will try to cut down cigarettes on her own  Mild diffusion defect on PFT from May 2015. Discussed how this could be early sign of emphysema. Plan: - monitor clinically  URI. Improving. Plan: - she is to complete Abx from  PCP   Chesley Mires, MD [facility] Pager:  [phone]

## 2014-09-13 NOTE — Patient Instructions (Signed)
Will schedule CT chest for May 2016 Follow up after CT chest done in May

## 2014-09-14 ENCOUNTER — Encounter: Payer: Self-pay | Admitting: Endocrinology

## 2014-09-24 ENCOUNTER — Telehealth: Payer: Self-pay | Admitting: Endocrinology

## 2014-09-24 NOTE — Telephone Encounter (Signed)
Would prefer to discuss the medication and possible side effects when she comes in for her physical in 2 weeks

## 2014-09-24 NOTE — Telephone Encounter (Signed)
Please see below and advise.

## 2014-09-24 NOTE — Telephone Encounter (Signed)
Patient would like for Dr. Dwyane Dee to call in Zyban for her to quit smoking   Please advise patient   Pharmacy: [facility]    Thank you

## 2014-09-28 DIAGNOSIS — H40033 Anatomical narrow angle, bilateral: Secondary | ICD-10-CM | POA: Diagnosis not present

## 2014-09-28 DIAGNOSIS — H2513 Age-related nuclear cataract, bilateral: Secondary | ICD-10-CM | POA: Diagnosis not present

## 2014-09-29 NOTE — Telephone Encounter (Signed)
Tried to call pt. Caller not available and VM not set up.

## 2014-10-06 ENCOUNTER — Other Ambulatory Visit: Payer: Medicare Other

## 2014-10-12 ENCOUNTER — Encounter: Payer: Medicare Other | Admitting: Endocrinology

## 2014-10-13 DIAGNOSIS — H40031 Anatomical narrow angle, right eye: Secondary | ICD-10-CM | POA: Diagnosis not present

## 2014-10-13 DIAGNOSIS — H40033 Anatomical narrow angle, bilateral: Secondary | ICD-10-CM | POA: Diagnosis not present

## 2014-10-15 ENCOUNTER — Other Ambulatory Visit: Payer: Self-pay

## 2014-10-15 DIAGNOSIS — Z1231 Encounter for screening mammogram for malignant neoplasm of breast: Secondary | ICD-10-CM

## 2014-10-18 ENCOUNTER — Other Ambulatory Visit: Payer: Self-pay | Admitting: Family Medicine

## 2014-10-25 DIAGNOSIS — I10 Essential (primary) hypertension: Secondary | ICD-10-CM | POA: Diagnosis not present

## 2014-10-25 DIAGNOSIS — M542 Cervicalgia: Secondary | ICD-10-CM | POA: Diagnosis not present

## 2014-10-25 DIAGNOSIS — H44513 Absolute glaucoma, bilateral: Secondary | ICD-10-CM | POA: Diagnosis not present

## 2014-10-25 DIAGNOSIS — E890 Postprocedural hypothyroidism: Secondary | ICD-10-CM | POA: Diagnosis not present

## 2014-11-02 ENCOUNTER — Other Ambulatory Visit (INDEPENDENT_AMBULATORY_CARE_PROVIDER_SITE_OTHER): Payer: Commercial Managed Care - HMO

## 2014-11-02 DIAGNOSIS — M150 Primary generalized (osteo)arthritis: Secondary | ICD-10-CM | POA: Diagnosis not present

## 2014-11-02 DIAGNOSIS — E890 Postprocedural hypothyroidism: Secondary | ICD-10-CM

## 2014-11-02 DIAGNOSIS — M8949 Other hypertrophic osteoarthropathy, multiple sites: Secondary | ICD-10-CM

## 2014-11-02 DIAGNOSIS — M159 Polyosteoarthritis, unspecified: Secondary | ICD-10-CM

## 2014-11-02 LAB — BASIC METABOLIC PANEL
BUN: 19 mg/dL (ref 6–23)
CHLORIDE: 103 meq/L (ref 96–112)
CO2: 32 meq/L (ref 19–32)
Calcium: 9.5 mg/dL (ref 8.4–10.5)
Creatinine, Ser: 0.68 mg/dL (ref 0.40–1.20)
GFR: 91.82 mL/min (ref >60.00)
Glucose, Bld: 99 mg/dL (ref 70–99)
POTASSIUM: 3.8 meq/L (ref 3.5–5.1)
SODIUM: 140 meq/L (ref 135–145)

## 2014-11-02 LAB — TSH: TSH: 4.66 u[IU]/mL — AB (ref 0.35–4.50)

## 2014-11-03 DIAGNOSIS — H40032 Anatomical narrow angle, left eye: Secondary | ICD-10-CM | POA: Diagnosis not present

## 2014-11-04 ENCOUNTER — Ambulatory Visit: Payer: Medicare Other

## 2014-11-05 ENCOUNTER — Encounter: Payer: Self-pay | Admitting: Endocrinology

## 2014-11-05 ENCOUNTER — Ambulatory Visit (INDEPENDENT_AMBULATORY_CARE_PROVIDER_SITE_OTHER): Payer: Commercial Managed Care - HMO | Admitting: Endocrinology

## 2014-11-05 VITALS — BP 160/70 | HR 74 | Temp 98.0°F | Resp 14 | Ht 67.0 in | Wt 123.8 lb

## 2014-11-05 DIAGNOSIS — F329 Major depressive disorder, single episode, unspecified: Secondary | ICD-10-CM | POA: Diagnosis not present

## 2014-11-05 DIAGNOSIS — Z0000 Encounter for general adult medical examination without abnormal findings: Secondary | ICD-10-CM

## 2014-11-05 DIAGNOSIS — M810 Age-related osteoporosis without current pathological fracture: Secondary | ICD-10-CM

## 2014-11-05 DIAGNOSIS — F32A Depression, unspecified: Secondary | ICD-10-CM

## 2014-11-05 DIAGNOSIS — E890 Postprocedural hypothyroidism: Secondary | ICD-10-CM | POA: Diagnosis not present

## 2014-11-05 DIAGNOSIS — I10 Essential (primary) hypertension: Secondary | ICD-10-CM | POA: Diagnosis not present

## 2014-11-05 MED ORDER — BUPROPION HCL ER (SR) 100 MG PO TB12: 100.0000 mg | ORAL_TABLET | Freq: Every day | ORAL | Status: DC

## 2014-11-05 NOTE — Progress Notes (Signed)
Patient ID: Victoria Blair, female   DOB: April 23, 1946, 69 y.o.   MRN: [id number]    Reason for Appointment: Complete physical exam     History of Present Illness:   1. HYPOTHYROIDISM: This was first diagnosed in 1978 after I-131 treatment for Graves' disease  The treatments that the patient has taken include generic Synthroid.   More recently has been taking the generic medication         In 1/15 because of low TSH the dose was reduced to 88 mcg  She does not complain of any unusual fatigue now have her TSH is trending higher at 4.7 Compliance with the medical regimen has been as prescribed with taking the tablet in the morning before breakfast.  Lab Results  Component Value Date   FREET4 1.26 06/09/2014   FREET4 1.01 01/06/2014   FREET4 1.51 09/03/2013   TSH 4.66* 11/02/2014   TSH 0.56 06/09/2014   TSH 0.59 05/18/2014  '  HYPERTENSION: She has been on treatment since 2009 and has been on Lotrel since then with good control.  Because of low normal blood pressure in 10/14 her Lotrel was reduced to 2.5/10.  Blood pressure is quite normal now although high on her initial measurement today Does not monitor at home    PREVENTIVE CARE:  Diet: Trying to eat healthy Exercise: none History of falls: None   Smokes 5 cigarettes daily  Annual hemoccults:           no  Pap:                            ?  2014  Colonoscopy/sigmoidoscopy 07/2014 Mammograms:  3/15  Yearly flu vaccine:                      yes   Bone Density:   2013  Calcium supplements: Yes Tetanus booster:  2004  Zostavax: 2010 Pneumovax:  2011        Medication List       This list is accurate as of: 11/05/14  1:21 PM.  Always use your most recent med list.               acyclovir ointment 5 %  Commonly known as:  ZOVIRAX  Apply 1 application topically 2 (two) times daily. Apply to affected area twice a day     amlodipine-benazepril 2.5-10 MG per capsule  Commonly known as:  LOTREL  TAKE ONE CAPSULE  BY MOUTH EVERY DAY     CALCIUM + D PO  Take 1 tablet by mouth daily.     cetirizine 10 MG tablet  Commonly known as:  ZYRTEC  Take 10 mg by mouth daily as needed for allergies.     dicyclomine 20 MG tablet  Commonly known as:  BENTYL  Take 20 mg by mouth every 6 (six) hours as needed.     fish oil-omega-3 fatty acids 1000 MG capsule  Take 1 g by mouth daily.     fluticasone 50 MCG/ACT nasal spray  Commonly known as:  FLONASE  Place 2 sprays into both nostrils daily.     levothyroxine 88 MCG tablet  Commonly known as:  SYNTHROID, LEVOTHROID  TAKE 1 TABLET (88 MCG TOTAL) BY MOUTH DAILY.     meloxicam 7.5 MG tablet  Commonly known as:  MOBIC  TAKE 1 TABLET (7.5 MG TOTAL) BY MOUTH DAILY.     multivitamin tablet  Take 1 tablet by mouth daily.     prednisoLONE acetate 1 % ophthalmic suspension  Commonly known as:  PRED FORTE     ranitidine 150 MG tablet  Commonly known as:  ZANTAC  Take 150 mg by mouth daily as needed.     tiZANidine 4 MG tablet  Commonly known as:  ZANAFLEX     VITAMIN E PO  Take 1 tablet by mouth daily.        Past Medical History  Diagnosis Date  . GERD (gastroesophageal reflux disease)   . Osteoporosis   . Breast cancer 1997  . Hypertension   . Vitamin D deficiency   . Graves disease   . Migraines   . Hemorrhoids     Past Surgical History  Procedure Laterality Date  . Breast lumpectomy    . Cholecystectomy    . Spine surgery    . Cesarean section      Right breast cancer treated with lumpectomy and radiation 2000, lumbar spine surgery     Family History  Problem Relation Age of Onset  . Heart disease Father   . Pulmonary fibrosis Father   . Pancreatic cancer Sister   . Sleep apnea Son     Social History:  reports that she has been smoking Cigarettes.  She has a 12 pack-year smoking history. She does not have any smokeless tobacco history on file. She reports that she drinks alcohol. She reports that she does not use illicit  drugs.  Allergies:  No Known Allergies  REVIEW of systems:  She has gained weight since her last visit   Wt Readings from Last 3 Encounters:  11/05/14 123 lb 12.8 oz (56.155 kg)  09/13/14 118 lb (53.524 kg)  06/15/14 117 lb (53.071 kg)    She is followed by pulmonologist for small nodules on her CT scan, these are apparently stable and benign appearing   Hyperlipidemia: She has mild increase in LDL, does not have any other risk factors to need statin treatment   Lab Results  Component Value Date   CHOL 173 06/09/2014   HDL 41.20 06/09/2014   LDLCALC 117* 06/09/2014   TRIG 72.0 06/09/2014   CHOLHDL 4 06/09/2014       Eyes:   is being treated for glaucoma, vision normal                                    No unusual headaches.     ENT: negative                                    Skin:  Has not been treated for rosacea     Her blood pressure has been high previously but never treated except with low-dose propranolol which was being used for her rosacea     Bowel habits:  No change.  She had colonoscopy done last December because of abdominal discomfort.  She sometimes feels a knot in her upper stomach and has some heartburn.  Taking Prilosec only rarely now Also was told by gastroenterologist to have some irritable bowel syndrome                No frequency of urination, nocturia.      Gynecological symptoms: No  hot flushes      She has had mild knee joint  pains. Also has significant pain in her thumb joints which is now improved.  Has not taken any medication for this now. She does have recent back pain and will take Aleve once a day or so with relief, has taken about 5 tablets in the last week      She has osteopenia; previously was given Actonel but stopped this because of difficulty with compliance.  T score 2.6 in 2013, previously was -1.7 at spine and  in 2013 was -2.3, previously was 1.1 at left hip. Taking Evista since 5/09, however she has been forgetting to take  it recently.  In her youth she was 5 feet 7-1/2 inches tall. No history of fractures.       No numbness or tingling in hands or feet.  Apparently right big toe is slightly numb since her back surgery      She has  late insomnia. Does feelsomewhat  depressed. See depression screen results  Did not tolerate Zoloft well previously  No anemia present  Lab Results  Component Value Date   WBC 4.4 06/09/2014   HGB 12.3 06/09/2014   HCT 37.4 06/09/2014   MCV 94.9 06/09/2014   PLT 244.0 06/09/2014     Examination:   BP 160/70 mmHg  Pulse 74  Temp(Src) 98 F (36.7 C)  Resp 14  Ht 5\' 7"  (1.702 m)  Wt 123 lb 12.8 oz (56.155 kg)  BMI 19.39 kg/m2  SpO2 98%  Repeat blood pressure 136/68   GENERAL: Averagely built and nourished  No pallor, clubbing, lymphadenopathy or edema.  Skin:  no rash or pigmentation.  EYES:  Externally normal.  Fundii: Exam deferred to ophthalmologist  ENT: Oral mucosa and tongue normal.  THYROID:  Not palpable.  CAROTIDS:  Normal character; no bruit.  HEART:  Normal  S1 and S2; no murmur or click.  CHEST:  Normal shape.  Lungs: Vescicular breath sounds heard equally.  No crepitations/ wheeze.  BREAST exam: No mass felt on either side.  No skin changes over the breasts  ABDOMEN:  No distention.  Liver and spleen not palpable.  No other mass or tenderness. No abdominal bruit or abnormal pulsation  NEUROLOGICAL: .Reflexes are bilaterally at ankles.  JOINTS: Enlargement of the joints of the thumb on the left side, mildly on the right. Mild lower dorsal kyphosis present.  Other joints appear normal     Assessments / PLAN:  Hypothyroidism, post ablative, long-standing TSH is Trending above normal now with the 88 g of her generic levothyroxine. Since she has a relatively large supply she can take an extra half tablet weekly until finished and then have follow-up TSH  HYPERTENSION: The blood pressure is well-controlled and she can continue the  current dose of Lotrel  Smoking cessation: She appears  motivated to quit and will try her on Wellbutrin since she has some features of depression and she wants a pharmacological aid to help her quit.  Discussed other options including Chantix which she is somewhat reluctant to consider because of fear of side effects.  She is also not keen on using the neck routine substitutes.  Discussed actions of bupropion in help in quitting smoking.  She should try and quit smoking about 10-12 days after starting this.  Also discussed benefits of quitting smoking with long-term health issues including pulmonary disease, cardiovascular disease and cancer.  Time spent 5 minutes  OSTEOPOROSIS: She is not taking Evista recently and  emphasized the need for taking regularly.  Also may  consider repeat bone density with her gynecologist this year   Patient Instructions  Take extra half tablet of Synthroid  once a week  EVISTA: Start taking this daily, may take it any time of the day  Take bupropion in the morning with breakfast, call if not tolerating this or need a higher dose   Check breasts for lumps monthly with the palm of the hand  Take  calcium daily  Low saturated fat diet, low intake of sugar and salt  Regular aerobic exercise 5 days a week  Regular dental and eye exams as recommended by the specialists  Make sure smoke detectors are functional at home Wear seat belts all the time      [facility] 11/05/2014, 1:21 PM

## 2014-11-05 NOTE — Patient Instructions (Addendum)
Take extra half tablet of Synthroid  once a week  EVISTA: Start taking this daily, may take it any time of the day  Take bupropion in the morning with breakfast, call if not tolerating this or need a higher dose   Check breasts for lumps monthly with the palm of the hand  Take  calcium daily  Low saturated fat diet, low intake of sugar and salt  Regular aerobic exercise 5 days a week  Regular dental and eye exams as recommended by the specialists  Make sure smoke detectors are functional at home Wear seat belts all the time

## 2014-11-10 DIAGNOSIS — M542 Cervicalgia: Secondary | ICD-10-CM | POA: Diagnosis not present

## 2014-11-10 DIAGNOSIS — K219 Gastro-esophageal reflux disease without esophagitis: Secondary | ICD-10-CM | POA: Diagnosis not present

## 2014-11-10 DIAGNOSIS — I10 Essential (primary) hypertension: Secondary | ICD-10-CM | POA: Diagnosis not present

## 2014-11-10 DIAGNOSIS — M159 Polyosteoarthritis, unspecified: Secondary | ICD-10-CM | POA: Diagnosis not present

## 2014-11-10 DIAGNOSIS — M545 Low back pain: Secondary | ICD-10-CM | POA: Diagnosis not present

## 2014-11-18 DIAGNOSIS — H35342 Macular cyst, hole, or pseudohole, left eye: Secondary | ICD-10-CM | POA: Diagnosis not present

## 2014-11-18 DIAGNOSIS — H43813 Vitreous degeneration, bilateral: Secondary | ICD-10-CM | POA: Diagnosis not present

## 2014-11-18 DIAGNOSIS — H35372 Puckering of macula, left eye: Secondary | ICD-10-CM | POA: Diagnosis not present

## 2014-12-06 DIAGNOSIS — Z1231 Encounter for screening mammogram for malignant neoplasm of breast: Secondary | ICD-10-CM | POA: Diagnosis not present

## 2014-12-06 DIAGNOSIS — M810 Age-related osteoporosis without current pathological fracture: Secondary | ICD-10-CM | POA: Diagnosis not present

## 2014-12-06 DIAGNOSIS — Z124 Encounter for screening for malignant neoplasm of cervix: Secondary | ICD-10-CM | POA: Diagnosis not present

## 2014-12-06 DIAGNOSIS — N952 Postmenopausal atrophic vaginitis: Secondary | ICD-10-CM | POA: Diagnosis not present

## 2014-12-06 DIAGNOSIS — Z720 Tobacco use: Secondary | ICD-10-CM | POA: Diagnosis not present

## 2014-12-07 DIAGNOSIS — M542 Cervicalgia: Secondary | ICD-10-CM | POA: Diagnosis not present

## 2014-12-07 DIAGNOSIS — M545 Low back pain: Secondary | ICD-10-CM | POA: Diagnosis not present

## 2014-12-08 DIAGNOSIS — J069 Acute upper respiratory infection, unspecified: Secondary | ICD-10-CM | POA: Diagnosis not present

## 2014-12-08 DIAGNOSIS — J309 Allergic rhinitis, unspecified: Secondary | ICD-10-CM | POA: Diagnosis not present

## 2014-12-08 DIAGNOSIS — J3489 Other specified disorders of nose and nasal sinuses: Secondary | ICD-10-CM | POA: Diagnosis not present

## 2014-12-22 ENCOUNTER — Other Ambulatory Visit: Payer: Self-pay | Admitting: Obstetrics and Gynecology

## 2014-12-22 DIAGNOSIS — M810 Age-related osteoporosis without current pathological fracture: Secondary | ICD-10-CM

## 2014-12-24 DIAGNOSIS — M542 Cervicalgia: Secondary | ICD-10-CM | POA: Diagnosis not present

## 2014-12-27 ENCOUNTER — Telehealth: Payer: Self-pay | Admitting: Endocrinology

## 2014-12-27 NOTE — Telephone Encounter (Signed)
Make sure you turn the form to [facility] (Notice of denial of payment)

## 2014-12-31 DIAGNOSIS — M542 Cervicalgia: Secondary | ICD-10-CM | POA: Diagnosis not present

## 2014-12-31 DIAGNOSIS — M545 Low back pain: Secondary | ICD-10-CM | POA: Diagnosis not present

## 2015-01-10 ENCOUNTER — Other Ambulatory Visit: Payer: Self-pay | Admitting: *Deleted

## 2015-01-10 MED ORDER — FLUTICASONE PROPIONATE 50 MCG/ACT NA SUSP: 2.0000 | Freq: Every day | NASAL | Status: DC

## 2015-01-11 ENCOUNTER — Telehealth: Payer: Self-pay | Admitting: Pulmonary Disease

## 2015-01-11 DIAGNOSIS — K068 Other specified disorders of gingiva and edentulous alveolar ridge: Secondary | ICD-10-CM | POA: Diagnosis not present

## 2015-01-11 NOTE — Telephone Encounter (Signed)
Spoke with pt to relay VS's recs to her.  She states she can do a CT next month.  I called stacy at CT to let her know.  She will reach out to pt to reschedule this.  Forwarding to VS to make aware that CT needs to be pushed back a month.

## 2015-01-11 NOTE — Telephone Encounter (Signed)
Will route to VS to make him aware.

## 2015-01-11 NOTE — Telephone Encounter (Signed)
Can she come in for a chest xray instead.  This would not be as good as CT chest, but would provide some information.  It would also be considerably less expensive.

## 2015-01-12 ENCOUNTER — Other Ambulatory Visit: Payer: Commercial Managed Care - HMO

## 2015-01-12 NOTE — Telephone Encounter (Signed)
Noted  

## 2015-01-13 ENCOUNTER — Inpatient Hospital Stay: Admission: RE | Admit: 2015-01-13 | Payer: 59 | Source: Ambulatory Visit

## 2015-01-14 DIAGNOSIS — M542 Cervicalgia: Secondary | ICD-10-CM | POA: Diagnosis not present

## 2015-01-14 DIAGNOSIS — M545 Low back pain: Secondary | ICD-10-CM | POA: Diagnosis not present

## 2015-01-19 DIAGNOSIS — M545 Low back pain: Secondary | ICD-10-CM | POA: Diagnosis not present

## 2015-01-19 DIAGNOSIS — M542 Cervicalgia: Secondary | ICD-10-CM | POA: Diagnosis not present

## 2015-02-01 ENCOUNTER — Other Ambulatory Visit (INDEPENDENT_AMBULATORY_CARE_PROVIDER_SITE_OTHER): Payer: Commercial Managed Care - HMO

## 2015-02-01 DIAGNOSIS — E890 Postprocedural hypothyroidism: Secondary | ICD-10-CM

## 2015-02-01 DIAGNOSIS — B001 Herpesviral vesicular dermatitis: Secondary | ICD-10-CM | POA: Diagnosis not present

## 2015-02-01 DIAGNOSIS — M810 Age-related osteoporosis without current pathological fracture: Secondary | ICD-10-CM | POA: Diagnosis not present

## 2015-02-01 DIAGNOSIS — I10 Essential (primary) hypertension: Secondary | ICD-10-CM | POA: Diagnosis not present

## 2015-02-01 LAB — COMPREHENSIVE METABOLIC PANEL
ALK PHOS: 46 U/L (ref 39–117)
ALT: 15 U/L (ref 0–35)
AST: 19 U/L (ref 0–37)
Albumin: 4 g/dL (ref 3.5–5.2)
BUN: 16 mg/dL (ref 6–23)
CALCIUM: 9.1 mg/dL (ref 8.4–10.5)
CO2: 32 meq/L (ref 19–32)
Chloride: 103 mEq/L (ref 96–112)
Creatinine, Ser: 0.71 mg/dL (ref 0.40–1.20)
GFR: 87.29 mL/min (ref >60.00)
Glucose, Bld: 90 mg/dL (ref 70–99)
POTASSIUM: 4.1 meq/L (ref 3.5–5.1)
SODIUM: 138 meq/L (ref 135–145)
Total Bilirubin: 0.4 mg/dL (ref 0.2–1.2)
Total Protein: 6.5 g/dL (ref 6.0–8.3)

## 2015-02-01 LAB — VITAMIN D 25 HYDROXY (VIT D DEFICIENCY, FRACTURES): VITD: 23.18 ng/mL — ABNORMAL LOW (ref 30.00–100.00)

## 2015-02-01 LAB — TSH: TSH: 0.53 u[IU]/mL (ref 0.35–4.50)

## 2015-02-01 LAB — T4, FREE: Free T4: 1.04 ng/dL (ref 0.60–1.60)

## 2015-02-04 ENCOUNTER — Encounter: Payer: Self-pay | Admitting: Endocrinology

## 2015-02-04 ENCOUNTER — Ambulatory Visit (INDEPENDENT_AMBULATORY_CARE_PROVIDER_SITE_OTHER): Payer: Commercial Managed Care - HMO | Admitting: Endocrinology

## 2015-02-04 VITALS — BP 128/72 | HR 81 | Temp 98.1°F | Resp 14 | Ht 67.0 in | Wt 126.2 lb

## 2015-02-04 DIAGNOSIS — M810 Age-related osteoporosis without current pathological fracture: Secondary | ICD-10-CM

## 2015-02-04 DIAGNOSIS — I10 Essential (primary) hypertension: Secondary | ICD-10-CM

## 2015-02-04 DIAGNOSIS — E890 Postprocedural hypothyroidism: Secondary | ICD-10-CM | POA: Diagnosis not present

## 2015-02-04 NOTE — Progress Notes (Signed)
Patient ID: Victoria Blair, female   DOB: August 13, 1946, 69 y.o.   MRN: [id number]    Reason for Appointment:  Follow-up visit   History of Present Illness:   HYPOTHYROIDISM: This was first diagnosed in 1978 after I-131 treatment for Graves' disease  The treatment that the patient has taken include generic Synthroid.          In 1/15 because of low TSH the dose was reduced to 88 mcg    However in 3/ 16 her TSH was mildly increased even though she was asymptomatic; she was told to take an extra half tablet once a week which she is doing and her TSH is back to normal  She does not complain of any unusual fatigue now  Compliance with the medical regimen has been as prescribed with taking the tablet in the morning before breakfast.  Lab Results  Component Value Date   FREET4 1.04 02/01/2015   FREET4 1.26 06/09/2014   FREET4 1.01 01/06/2014   TSH 0.53 02/01/2015   TSH 4.66* 11/02/2014   TSH 0.56 06/09/2014  '  HYPERTENSION: She has been on treatment since 2009 and has been on Lotrel since then with good control.  Because of low normal blood pressure in 10/14 her Lotrel was reduced to 2.5/10.  Blood pressure is quite normal  inconsistent Does not monitor at home     SMOKING cessation:  She was given a trial of Wellbutrin 100 mg daily to help her quit smoking.  She was also having some anxiety.  She says she feels overall calmer and she has been able to almost quit smoking.  She may smoke 1 cigarette a week now    Does have a little weight gain recently     Medication List       This list is accurate as of: 02/04/15 11:59 PM.  Always use your most recent med list.               acyclovir ointment 5 %  Commonly known as:  ZOVIRAX  Apply 1 application topically 2 (two) times daily. Apply to affected area twice a day     amlodipine-benazepril 2.5-10 MG per capsule  Commonly known as:  LOTREL  TAKE ONE CAPSULE BY MOUTH EVERY DAY     buPROPion 100 MG 12 hr tablet  Commonly  known as:  WELLBUTRIN SR  Take 1 tablet (100 mg total) by mouth daily.     CALCIUM + D PO  Take 1 tablet by mouth daily.     cetirizine 10 MG tablet  Commonly known as:  ZYRTEC  Take 10 mg by mouth daily as needed for allergies.     dicyclomine 20 MG tablet  Commonly known as:  BENTYL  Take 20 mg by mouth every 6 (six) hours as needed.     fish oil-omega-3 fatty acids 1000 MG capsule  Take 1 g by mouth daily.     fluticasone 50 MCG/ACT nasal spray  Commonly known as:  FLONASE  Place 2 sprays into both nostrils daily.     levothyroxine 88 MCG tablet  Commonly known as:  SYNTHROID, LEVOTHROID  TAKE 1 TABLET (88 MCG TOTAL) BY MOUTH DAILY.     meloxicam 7.5 MG tablet  Commonly known as:  MOBIC  TAKE 1 TABLET (7.5 MG TOTAL) BY MOUTH DAILY.     multivitamin tablet  Take 1 tablet by mouth daily.     prednisoLONE acetate 1 % ophthalmic suspension  Commonly known  as:  PRED FORTE     raloxifene 60 MG tablet  Commonly known as:  EVISTA     ranitidine 150 MG tablet  Commonly known as:  ZANTAC  Take 150 mg by mouth daily as needed.     tiZANidine 4 MG tablet  Commonly known as:  ZANAFLEX     valACYclovir 500 MG tablet  Commonly known as:  VALTREX  TAKE ONE TABLET BY MOUTH DAILY FOR 30 DAYS     VITAMIN E PO  Take 1 tablet by mouth daily.        Past Medical History  Diagnosis Date  . GERD (gastroesophageal reflux disease)   . Osteoporosis   . Breast cancer 1997  . Hypertension   . Vitamin D deficiency   . Graves disease   . Migraines   . Hemorrhoids   . Depression     Past Surgical History  Procedure Laterality Date  . Breast lumpectomy    . Cholecystectomy    . Spine surgery    . Cesarean section      Right breast cancer treated with lumpectomy and radiation 2000, lumbar spine surgery     Family History  Problem Relation Age of Onset  . Heart disease Father   . Pulmonary fibrosis Father   . Hypertension Father   . Pancreatic cancer Sister   .  Sleep apnea Son   . Diabetes Maternal Aunt     Social History:  reports that she has been smoking Cigarettes.  She has a 12 pack-year smoking history. She does not have any smokeless tobacco history on file. She reports that she drinks alcohol. She reports that she does not use illicit drugs.  Allergies:  No Known Allergies  REVIEW of systems:  She has gained weight again since her last visit   Wt Readings from Last 3 Encounters:  02/04/15 126 lb 3.2 oz (57.244 kg)  11/05/14 123 lb 12.8 oz (56.155 kg)  09/13/14 118 lb (53.524 kg)    She is followed by pulmonologist for small nodules on her CT scan, these are apparently stable and benign appearing   Hyperlipidemia: She has mild increase in LDL, does not have any other risk factors to need statin treatment   Lab Results  Component Value Date   CHOL 173 06/09/2014   HDL 41.20 06/09/2014   LDLCALC 117* 06/09/2014   TRIG 72.0 06/09/2014   CHOLHDL 4 06/09/2014     She has OSTEOPENIA; previously was given Actonel but stopped this because of difficulty with compliance.  T score - 2.6 in 2013, previously was -1.7 at spine and in 2013, T score at the hip was -2.3, previously was - 1.1 at left hip. Also followed by gynecologist  Taking Evista since 5/09, and is more regular on this now In her youth she was 5 feet 7-1/2 inches tall. No history of fractures. Has also been advised to take OTC vitamin D         Examination:   BP 128/72 mmHg  Pulse 81  Temp(Src) 98.1 F (36.7 C)  Resp 14  Ht 5\' 7"  (1.702 m)  Wt 126 lb 3.2 oz (57.244 kg)  BMI 19.76 kg/m2  SpO2 95%      Assessments / PLAN:  Hypothyroidism, post ablative, long-standing TSH is back to normal with extra half tablet weekly of 88 g levothyroxine Since she is compliant with this will continue the same regimen  HYPERTENSION: The blood pressure is well-controlled and she can continue the  current dose of Lotrel  Smoking cessation: She appears to be able to quit  smoking with the help of adding Wellbutrin and can continue  OSTEOPOROSIS: She is taking Evista recently and  emphasized the need for taking regularly.   Also  she will get bone density with her gynecologist this year  [facility] 02/06/2015, 2:44 PM    Addendum: Vitamin D level low, she needs to take at least 2000 units vitamin D 3 daily

## 2015-02-05 NOTE — Progress Notes (Signed)
Quick Note:  Please let patient know that the vitamin D level is low, if she is not taking any vitamin D 3 and is to take 2000 units daily  ______

## 2015-02-07 ENCOUNTER — Inpatient Hospital Stay: Admission: RE | Admit: 2015-02-07 | Payer: Commercial Managed Care - HMO | Source: Ambulatory Visit

## 2015-02-10 ENCOUNTER — Ambulatory Visit
Admission: RE | Admit: 2015-02-10 | Discharge: 2015-02-10 | Disposition: A | Discharge status: Home / Self Care | Payer: Commercial Managed Care - HMO | Source: Ambulatory Visit | Attending: Obstetrics and Gynecology | Admitting: Obstetrics and Gynecology

## 2015-02-10 DIAGNOSIS — M810 Age-related osteoporosis without current pathological fracture: Secondary | ICD-10-CM | POA: Diagnosis not present

## 2015-02-16 ENCOUNTER — Ambulatory Visit (INDEPENDENT_AMBULATORY_CARE_PROVIDER_SITE_OTHER)
Admission: RE | Admit: 2015-02-16 | Discharge: 2015-02-16 | Disposition: A | Discharge status: Home / Self Care | Payer: Commercial Managed Care - HMO | Source: Ambulatory Visit | Attending: Pulmonary Disease | Admitting: Pulmonary Disease

## 2015-02-16 DIAGNOSIS — R918 Other nonspecific abnormal finding of lung field: Secondary | ICD-10-CM

## 2015-02-18 DIAGNOSIS — M549 Dorsalgia, unspecified: Secondary | ICD-10-CM | POA: Diagnosis not present

## 2015-02-18 DIAGNOSIS — M25511 Pain in right shoulder: Secondary | ICD-10-CM | POA: Diagnosis not present

## 2015-02-18 DIAGNOSIS — N644 Mastodynia: Secondary | ICD-10-CM | POA: Diagnosis not present

## 2015-02-24 DIAGNOSIS — N644 Mastodynia: Secondary | ICD-10-CM | POA: Diagnosis not present

## 2015-03-04 ENCOUNTER — Other Ambulatory Visit: Payer: Self-pay | Admitting: Obstetrics and Gynecology

## 2015-03-04 DIAGNOSIS — Z9889 Other specified postprocedural states: Secondary | ICD-10-CM

## 2015-03-04 DIAGNOSIS — L219 Seborrheic dermatitis, unspecified: Secondary | ICD-10-CM | POA: Diagnosis not present

## 2015-03-04 DIAGNOSIS — N644 Mastodynia: Secondary | ICD-10-CM

## 2015-03-04 DIAGNOSIS — H6123 Impacted cerumen, bilateral: Secondary | ICD-10-CM | POA: Diagnosis not present

## 2015-03-11 ENCOUNTER — Ambulatory Visit (INDEPENDENT_AMBULATORY_CARE_PROVIDER_SITE_OTHER): Payer: Commercial Managed Care - HMO | Admitting: Pulmonary Disease

## 2015-03-11 ENCOUNTER — Encounter: Payer: Self-pay | Admitting: Pulmonary Disease

## 2015-03-11 VITALS — BP 112/70 | HR 75 | Ht 67.0 in | Wt 127.0 lb

## 2015-03-11 DIAGNOSIS — J438 Other emphysema: Secondary | ICD-10-CM

## 2015-03-11 DIAGNOSIS — Z720 Tobacco use: Secondary | ICD-10-CM | POA: Diagnosis not present

## 2015-03-11 DIAGNOSIS — R911 Solitary pulmonary nodule: Secondary | ICD-10-CM | POA: Diagnosis not present

## 2015-03-11 NOTE — Patient Instructions (Signed)
Will schedule CT chest for June 2017 Follow up in June 2017 after CT chest done

## 2015-03-11 NOTE — Progress Notes (Signed)
Chief Complaint  Patient presents with  . Follow-up    Review CT results.     History of Present Illness: Victoria Blair is a 69 y.o. female smoker with lung nodule.  She has been doing okay.  She is down to 1 or 2 cigarettes per day.  She denies cough, wheeze, sputum, hemoptysis, or chest pain.  She has retired since last visit.  She feels less stressed, and is able to keep her weight up better.  TESTS: PFT 01/05/14 >> FEV1 1.80 (64%), FEV1% 74, TLC 4.44 (78%), DLCO 65% CT chest 01/07/14 >> biapical scarring, 6 mm nodule LLL, 5 mm nodule LLL CT chest 07/14/14 >> biapical scarring, 6 mm nodule LLL, 5 mm nodule LLL CT chest 02/16/15 >> mild centrilobular and paraseptal emphysema, no change to slight improvement in nodules  Past medical hx >> GERD, Breast cancer 1997, Osteoporosis, HTN, Graves disease, Migraines  Past surgical hx, Medications, Allergies, Family hx, Social hx all reviewed.   Physical Exam: BP 112/70 mmHg  Pulse 75  Ht 5\' 7"  (1.702 m)  Wt 127 lb (57.607 kg)  BMI 19.89 kg/m2  SpO2 98%  General - No distress ENT - No sinus tenderness, no oral exudate, no LAN Cardiac - s1s2 regular, no murmur Chest - No wheeze/rales/dullness Back - No focal tenderness Abd - Soft, non-tender Ext - No edema Neuro - Normal strength Skin - No rashes Psych - normal mood, and behavior   Assessment/Plan:  Pulmonary nodules. Plan: - she will need f/u CT chest w/o contrast for May 2017 to complete 2 yr surveillance  Tobacco abuse. Plan: - encouraged her to keep up with her smoking cessation efforts.  Emphysema. Not much as far as respiratory symptoms. Plan: - monitor clinically   Chesley Mires, MD [facility] Pager:  [phone]

## 2015-03-16 DIAGNOSIS — M810 Age-related osteoporosis without current pathological fracture: Secondary | ICD-10-CM | POA: Diagnosis not present

## 2015-03-17 ENCOUNTER — Other Ambulatory Visit: Payer: Self-pay | Admitting: Endocrinology

## 2015-04-01 DIAGNOSIS — D509 Iron deficiency anemia, unspecified: Secondary | ICD-10-CM | POA: Diagnosis not present

## 2015-04-01 DIAGNOSIS — R42 Dizziness and giddiness: Secondary | ICD-10-CM | POA: Diagnosis not present

## 2015-04-01 DIAGNOSIS — R5382 Chronic fatigue, unspecified: Secondary | ICD-10-CM | POA: Diagnosis not present

## 2015-04-01 DIAGNOSIS — M2550 Pain in unspecified joint: Secondary | ICD-10-CM | POA: Diagnosis not present

## 2015-04-12 DIAGNOSIS — K1379 Other lesions of oral mucosa: Secondary | ICD-10-CM | POA: Diagnosis not present

## 2015-04-13 DIAGNOSIS — L439 Lichen planus, unspecified: Secondary | ICD-10-CM | POA: Diagnosis not present

## 2015-04-13 DIAGNOSIS — M2550 Pain in unspecified joint: Secondary | ICD-10-CM | POA: Diagnosis not present

## 2015-04-13 DIAGNOSIS — M810 Age-related osteoporosis without current pathological fracture: Secondary | ICD-10-CM | POA: Diagnosis not present

## 2015-04-13 DIAGNOSIS — M150 Primary generalized (osteo)arthritis: Secondary | ICD-10-CM | POA: Diagnosis not present

## 2015-04-13 DIAGNOSIS — M79641 Pain in right hand: Secondary | ICD-10-CM | POA: Diagnosis not present

## 2015-04-13 DIAGNOSIS — M79642 Pain in left hand: Secondary | ICD-10-CM | POA: Diagnosis not present

## 2015-04-13 DIAGNOSIS — R768 Other specified abnormal immunological findings in serum: Secondary | ICD-10-CM | POA: Diagnosis not present

## 2015-05-03 ENCOUNTER — Ambulatory Visit
Admission: RE | Admit: 2015-05-03 | Discharge: 2015-05-03 | Disposition: A | Discharge status: Home / Self Care | Payer: Commercial Managed Care - HMO | Source: Ambulatory Visit | Attending: Obstetrics and Gynecology | Admitting: Obstetrics and Gynecology

## 2015-05-03 DIAGNOSIS — N644 Mastodynia: Secondary | ICD-10-CM

## 2015-05-03 DIAGNOSIS — Z9889 Other specified postprocedural states: Secondary | ICD-10-CM

## 2015-05-03 DIAGNOSIS — R928 Other abnormal and inconclusive findings on diagnostic imaging of breast: Secondary | ICD-10-CM | POA: Diagnosis not present

## 2015-05-06 DIAGNOSIS — M2550 Pain in unspecified joint: Secondary | ICD-10-CM | POA: Diagnosis not present

## 2015-05-06 DIAGNOSIS — Z1159 Encounter for screening for other viral diseases: Secondary | ICD-10-CM | POA: Diagnosis not present

## 2015-05-06 DIAGNOSIS — J029 Acute pharyngitis, unspecified: Secondary | ICD-10-CM | POA: Diagnosis not present

## 2015-05-10 DIAGNOSIS — K1329 Other disturbances of oral epithelium, including tongue: Secondary | ICD-10-CM | POA: Diagnosis not present

## 2015-05-13 DIAGNOSIS — K1329 Other disturbances of oral epithelium, including tongue: Secondary | ICD-10-CM | POA: Diagnosis not present

## 2015-05-17 DIAGNOSIS — H43813 Vitreous degeneration, bilateral: Secondary | ICD-10-CM | POA: Diagnosis not present

## 2015-05-17 DIAGNOSIS — H35372 Puckering of macula, left eye: Secondary | ICD-10-CM | POA: Diagnosis not present

## 2015-05-17 DIAGNOSIS — H35342 Macular cyst, hole, or pseudohole, left eye: Secondary | ICD-10-CM | POA: Diagnosis not present

## 2015-05-17 DIAGNOSIS — D3132 Benign neoplasm of left choroid: Secondary | ICD-10-CM | POA: Diagnosis not present

## 2015-05-19 ENCOUNTER — Ambulatory Visit (INDEPENDENT_AMBULATORY_CARE_PROVIDER_SITE_OTHER): Payer: Commercial Managed Care - HMO | Admitting: Endocrinology

## 2015-05-19 VITALS — BP 120/78 | HR 69 | Temp 98.1°F | Resp 16 | Ht 67.0 in | Wt 127.6 lb

## 2015-05-19 DIAGNOSIS — I10 Essential (primary) hypertension: Secondary | ICD-10-CM

## 2015-05-19 DIAGNOSIS — M810 Age-related osteoporosis without current pathological fracture: Secondary | ICD-10-CM

## 2015-05-19 DIAGNOSIS — E890 Postprocedural hypothyroidism: Secondary | ICD-10-CM | POA: Diagnosis not present

## 2015-05-19 DIAGNOSIS — Z23 Encounter for immunization: Secondary | ICD-10-CM

## 2015-05-19 NOTE — Progress Notes (Signed)
Patient ID: Victoria Blair, female   DOB: 1946/05/18, 69 y.o.   MRN: [id number]    Reason for Appointment:  Follow-up visit   History of Present Illness:   HYPOTHYROIDISM: This was first diagnosed in 1978 after I-131 treatment for Graves' disease  The treatment that the patient has taken include generic Synthroid.          In 1/15 because of low TSH the dose was reduced to 88 mcg  In 3/16 her TSH was mildly increased even though she was asymptomatic; she was told to take an extra half tablet once a week which she is doing and her TSH was back to normal  She does not complain of any unusual fatigue  She is asking about taking brand name medication to see if it would help her feel better   Compliance with the medical regimen has been as prescribed with taking the tablet in the morning before breakfast.  Lab Results  Component Value Date   FREET4 1.04 02/01/2015   FREET4 1.26 06/09/2014   FREET4 1.01 01/06/2014   TSH 0.53 02/01/2015   TSH 4.66* 11/02/2014   TSH 0.56 06/09/2014  '  HYPERTENSION: She has been on treatment since 2009 and has been on Lotrel since then with good control.  Because of low normal blood pressure in 10/14 her Lotrel was reduced to 2.5/10.  Blood pressure is quite normal  inconsistent Does not monitor at home     SMOKING cessation:  She was given a trial of Wellbutrin 100 mg daily to help her quit smoking.  She was also having some anxiety.   With this she feels overall calmer and she has been able to reduce smoking.  She may smoke 2 cigarette a day although previously had been down to one a week        Medication List       This list is accurate as of: 05/19/15  3:26 PM.  Always use your most recent med list.               acyclovir ointment 5 %  Commonly known as:  ZOVIRAX  Apply 1 application topically 2 (two) times daily. Apply to affected area twice a day     amlodipine-benazepril 2.5-10 MG per capsule  Commonly known as:  LOTREL  TAKE  ONE CAPSULE BY MOUTH EVERY DAY     buPROPion 100 MG 12 hr tablet  Commonly known as:  WELLBUTRIN SR  Take 1 tablet (100 mg total) by mouth daily.     CALCIUM + D PO  Take 1 tablet by mouth daily.     cetirizine 10 MG tablet  Commonly known as:  ZYRTEC  Take 10 mg by mouth daily as needed for allergies.     fish oil-omega-3 fatty acids 1000 MG capsule  Take 1 g by mouth daily.     fluticasone 50 MCG/ACT nasal spray  Commonly known as:  FLONASE  Place 2 sprays into both nostrils daily.     levothyroxine 88 MCG tablet  Commonly known as:  SYNTHROID, LEVOTHROID  TAKE 1 TABLET (88 MCG TOTAL) BY MOUTH DAILY.     meloxicam 7.5 MG tablet  Commonly known as:  MOBIC  TAKE 1 TABLET (7.5 MG TOTAL) BY MOUTH DAILY.     multivitamin tablet  Take 1 tablet by mouth daily.     raloxifene 60 MG tablet  Commonly known as:  EVISTA     ranitidine 150 MG tablet  Commonly known as:  ZANTAC  Take 150 mg by mouth daily as needed.     valACYclovir 500 MG tablet  Commonly known as:  VALTREX  TAKE ONE TABLET BY MOUTH DAILY FOR 30 DAYS     VITAMIN E PO  Take 1 tablet by mouth daily.        Past Medical History  Diagnosis Date  . GERD (gastroesophageal reflux disease)   . Osteoporosis   . Breast cancer 1997  . Hypertension   . Vitamin D deficiency   . Graves disease   . Migraines   . Hemorrhoids   . Depression     Past Surgical History  Procedure Laterality Date  . Breast lumpectomy    . Cholecystectomy    . Spine surgery    . Cesarean section      Right breast cancer treated with lumpectomy and radiation 2000, lumbar spine surgery     Family History  Problem Relation Age of Onset  . Heart disease Father   . Pulmonary fibrosis Father   . Hypertension Father   . Pancreatic cancer Sister   . Sleep apnea Son   . Diabetes Maternal Aunt     Social History:  reports that she has been smoking Cigarettes.  She has a 12 pack-year smoking history. She does not have any  smokeless tobacco history on file. She reports that she drinks alcohol. She reports that she does not use illicit drugs.  Allergies:  No Known Allergies  REVIEW of systems:  She has not gained weight again since her last visit   Wt Readings from Last 3 Encounters:  05/19/15 127 lb 9.6 oz (57.879 kg)  03/11/15 127 lb (57.607 kg)  02/04/15 126 lb 3.2 oz (57.244 kg)    She is followed by pulmonologist for small nodules on her CT scan, these are apparently stable and benign appearing   Hyperlipidemia: She has mild increase in LDL, does not have any other risk factors to need statin treatment   Lab Results  Component Value Date   CHOL 173 06/09/2014   HDL 41.20 06/09/2014   LDLCALC 117* 06/09/2014   TRIG 72.0 06/09/2014   CHOLHDL 4 06/09/2014     She has OSTEOPENIA; previously was given Actonel but stopped this because of difficulty with compliance.  T score -2.5 recently done by gynecologist and she was recommended Fosamax but she did not want to do this because of fear of side effects.    Her bone density is the same at the hip, previously T score was - 2.6 in 2013, previously was -1.7 at spine and in 2013, T score at the hip was -2.3, previously was - 1.1 at left hip.t  Taking Evista since 5/09, and is  regular on this now In her youth she was 5 feet 7-1/2 inches tall. No history of fractures. Has also been advised to take OTC vitamin D3 and she is taking 2000 units now since her level is low in 6/16         Examination:   BP 120/78 mmHg  Pulse 69  Temp(Src) 98.1 F (36.7 C)  Resp 16  Ht 5\' 7"  (1.702 m)  Wt 127 lb 9.6 oz (57.879 kg)  BMI 19.98 kg/m2  SpO2 98%      Assessments / PLAN: OSTEOPOROSIS: She is taking Evista recently without side effects Her bone density is stable Discussed that Evista does improve bone architecture and bone density is not a guide to treatment  HYPOTHYROIDISM, post ablative, long-standing TSH was normal with extra half tablet weekly  of 88 g levothyroxine Since she is compliant with this will continue the same regimen, recheck TSH today  HYPERTENSION: The blood pressure is well-controlled and she can continue the current dose of Lotrel  Smoking cessation: She appears to be reducing her smoking with the help of adding Wellbutrin and can continue working on quitting completely  Anxiety/depression: She is doing well with Wellbutrin low dose and she does not want to increase the dose  Influenza vaccine given  [facility] 05/19/2015, 3:26 PM

## 2015-05-20 LAB — BASIC METABOLIC PANEL
BUN: 17 mg/dL (ref 6–23)
CALCIUM: 9.6 mg/dL (ref 8.4–10.5)
CHLORIDE: 103 meq/L (ref 96–112)
CO2: 27 meq/L (ref 19–32)
Creatinine, Ser: 0.78 mg/dL (ref 0.40–1.20)
GFR: 78.24 mL/min (ref >60.00)
Glucose, Bld: 87 mg/dL (ref 70–99)
Potassium: 4.2 mEq/L (ref 3.5–5.1)
SODIUM: 140 meq/L (ref 135–145)

## 2015-05-20 LAB — TSH: TSH: 0.45 u[IU]/mL (ref 0.35–4.50)

## 2015-05-20 LAB — T4, FREE: FREE T4: 1.28 ng/dL (ref 0.60–1.60)

## 2015-05-20 NOTE — Progress Notes (Signed)
Quick Note:  Please let patient know that the thyroid test is stable but high normal, continue levothyroxine 88 g but take 6-1/2 tablets a week  ______

## 2015-07-14 DIAGNOSIS — M7751 Other enthesopathy of right foot: Secondary | ICD-10-CM | POA: Diagnosis not present

## 2015-07-14 DIAGNOSIS — M25571 Pain in right ankle and joints of right foot: Secondary | ICD-10-CM | POA: Diagnosis not present

## 2015-07-14 DIAGNOSIS — M65871 Other synovitis and tenosynovitis, right ankle and foot: Secondary | ICD-10-CM | POA: Diagnosis not present

## 2015-07-14 DIAGNOSIS — D2371 Other benign neoplasm of skin of right lower limb, including hip: Secondary | ICD-10-CM | POA: Diagnosis not present

## 2015-07-20 DIAGNOSIS — J309 Allergic rhinitis, unspecified: Secondary | ICD-10-CM | POA: Diagnosis not present

## 2015-07-20 DIAGNOSIS — H6980 Other specified disorders of Eustachian tube, unspecified ear: Secondary | ICD-10-CM | POA: Diagnosis not present

## 2015-08-01 DIAGNOSIS — M71571 Other bursitis, not elsewhere classified, right ankle and foot: Secondary | ICD-10-CM | POA: Diagnosis not present

## 2015-08-02 ENCOUNTER — Other Ambulatory Visit: Payer: Commercial Managed Care - HMO

## 2015-08-04 DIAGNOSIS — L218 Other seborrheic dermatitis: Secondary | ICD-10-CM | POA: Diagnosis not present

## 2015-08-04 DIAGNOSIS — L821 Other seborrheic keratosis: Secondary | ICD-10-CM | POA: Diagnosis not present

## 2015-08-04 DIAGNOSIS — D225 Melanocytic nevi of trunk: Secondary | ICD-10-CM | POA: Diagnosis not present

## 2015-08-04 DIAGNOSIS — L728 Other follicular cysts of the skin and subcutaneous tissue: Secondary | ICD-10-CM | POA: Diagnosis not present

## 2015-08-05 ENCOUNTER — Ambulatory Visit: Payer: Commercial Managed Care - HMO | Admitting: Endocrinology

## 2015-08-05 ENCOUNTER — Telehealth: Payer: Self-pay | Admitting: Endocrinology

## 2015-08-05 DIAGNOSIS — Z0289 Encounter for other administrative examinations: Secondary | ICD-10-CM

## 2015-08-05 NOTE — Telephone Encounter (Signed)
Rescheduled for about a month with labs, needs follow-up of thyroid levels

## 2015-08-05 NOTE — Telephone Encounter (Signed)
Patient no showed today's appt. Please advise on how to follow up. °A. No follow up necessary. °B. Follow up urgent. Contact patient immediately. °C. Follow up necessary. Contact patient and schedule visit in ___ days. °D. Follow up advised. Contact patient and schedule visit in ____weeks. ° °

## 2015-08-25 DIAGNOSIS — J040 Acute laryngitis: Secondary | ICD-10-CM | POA: Diagnosis not present

## 2015-08-30 DIAGNOSIS — J309 Allergic rhinitis, unspecified: Secondary | ICD-10-CM | POA: Diagnosis not present

## 2015-08-30 DIAGNOSIS — J0100 Acute maxillary sinusitis, unspecified: Secondary | ICD-10-CM | POA: Diagnosis not present

## 2015-08-30 DIAGNOSIS — J069 Acute upper respiratory infection, unspecified: Secondary | ICD-10-CM | POA: Diagnosis not present

## 2015-09-02 DIAGNOSIS — M159 Polyosteoarthritis, unspecified: Secondary | ICD-10-CM | POA: Diagnosis not present

## 2015-09-13 ENCOUNTER — Other Ambulatory Visit: Payer: Self-pay | Admitting: Endocrinology

## 2015-10-03 ENCOUNTER — Other Ambulatory Visit: Payer: Self-pay | Admitting: Endocrinology

## 2015-10-18 DIAGNOSIS — L820 Inflamed seborrheic keratosis: Secondary | ICD-10-CM | POA: Diagnosis not present

## 2015-10-18 DIAGNOSIS — D485 Neoplasm of uncertain behavior of skin: Secondary | ICD-10-CM | POA: Diagnosis not present

## 2015-10-20 DIAGNOSIS — M2550 Pain in unspecified joint: Secondary | ICD-10-CM | POA: Diagnosis not present

## 2015-10-20 DIAGNOSIS — R5382 Chronic fatigue, unspecified: Secondary | ICD-10-CM | POA: Diagnosis not present

## 2015-10-20 DIAGNOSIS — J309 Allergic rhinitis, unspecified: Secondary | ICD-10-CM | POA: Diagnosis not present

## 2015-10-20 DIAGNOSIS — E890 Postprocedural hypothyroidism: Secondary | ICD-10-CM | POA: Diagnosis not present

## 2015-10-20 DIAGNOSIS — M542 Cervicalgia: Secondary | ICD-10-CM | POA: Diagnosis not present

## 2015-10-24 ENCOUNTER — Other Ambulatory Visit: Payer: Self-pay

## 2015-10-24 DIAGNOSIS — Z1231 Encounter for screening mammogram for malignant neoplasm of breast: Secondary | ICD-10-CM

## 2015-11-03 DIAGNOSIS — M810 Age-related osteoporosis without current pathological fracture: Secondary | ICD-10-CM | POA: Diagnosis not present

## 2015-11-03 DIAGNOSIS — M2550 Pain in unspecified joint: Secondary | ICD-10-CM | POA: Diagnosis not present

## 2015-11-03 DIAGNOSIS — L439 Lichen planus, unspecified: Secondary | ICD-10-CM | POA: Diagnosis not present

## 2015-11-03 DIAGNOSIS — M150 Primary generalized (osteo)arthritis: Secondary | ICD-10-CM | POA: Diagnosis not present

## 2015-11-03 DIAGNOSIS — R768 Other specified abnormal immunological findings in serum: Secondary | ICD-10-CM | POA: Diagnosis not present

## 2015-11-11 ENCOUNTER — Other Ambulatory Visit (INDEPENDENT_AMBULATORY_CARE_PROVIDER_SITE_OTHER): Payer: Commercial Managed Care - HMO

## 2015-11-11 DIAGNOSIS — M352 Behcet's disease: Secondary | ICD-10-CM | POA: Diagnosis not present

## 2015-11-11 DIAGNOSIS — E890 Postprocedural hypothyroidism: Secondary | ICD-10-CM

## 2015-11-11 DIAGNOSIS — N766 Ulceration of vulva: Secondary | ICD-10-CM | POA: Diagnosis not present

## 2015-11-11 DIAGNOSIS — R300 Dysuria: Secondary | ICD-10-CM | POA: Diagnosis not present

## 2015-11-11 DIAGNOSIS — I10 Essential (primary) hypertension: Secondary | ICD-10-CM

## 2015-11-11 LAB — URINALYSIS, ROUTINE W REFLEX MICROSCOPIC
Bilirubin Urine: NEGATIVE
KETONES UR: NEGATIVE
Leukocytes, UA: NEGATIVE
NITRITE: NEGATIVE
SPECIFIC GRAVITY, URINE: 1.015 (ref 1.000–1.030)
Total Protein, Urine: NEGATIVE
URINE GLUCOSE: NEGATIVE
Urobilinogen, UA: 0.2 (ref 0.0–1.0)
pH: 6 (ref 5.0–8.0)

## 2015-11-11 LAB — COMPREHENSIVE METABOLIC PANEL
ALT: 18 U/L (ref 0–35)
AST: 22 U/L (ref 0–37)
Albumin: 4 g/dL (ref 3.5–5.2)
Alkaline Phosphatase: 49 U/L (ref 39–117)
BILIRUBIN TOTAL: 0.4 mg/dL (ref 0.2–1.2)
BUN: 17 mg/dL (ref 6–23)
CALCIUM: 9.3 mg/dL (ref 8.4–10.5)
CHLORIDE: 103 meq/L (ref 96–112)
CO2: 31 meq/L (ref 19–32)
Creatinine, Ser: 0.75 mg/dL (ref 0.40–1.20)
GFR: 81.75 mL/min (ref >60.00)
Glucose, Bld: 90 mg/dL (ref 70–99)
POTASSIUM: 3.9 meq/L (ref 3.5–5.1)
Sodium: 140 mEq/L (ref 135–145)
Total Protein: 6.7 g/dL (ref 6.0–8.3)

## 2015-11-11 LAB — TSH: TSH: 2.79 u[IU]/mL (ref 0.35–4.50)

## 2015-11-11 LAB — T4, FREE: FREE T4: 0.92 ng/dL (ref 0.60–1.60)

## 2015-11-16 ENCOUNTER — Ambulatory Visit (INDEPENDENT_AMBULATORY_CARE_PROVIDER_SITE_OTHER): Payer: Commercial Managed Care - HMO | Admitting: Endocrinology

## 2015-11-16 ENCOUNTER — Encounter: Payer: Self-pay | Admitting: Endocrinology

## 2015-11-16 ENCOUNTER — Other Ambulatory Visit: Payer: Self-pay | Admitting: *Deleted

## 2015-11-16 VITALS — BP 128/78 | HR 86 | Temp 98.0°F | Resp 14 | Ht 67.0 in | Wt 137.2 lb

## 2015-11-16 DIAGNOSIS — Z23 Encounter for immunization: Secondary | ICD-10-CM

## 2015-11-16 DIAGNOSIS — E890 Postprocedural hypothyroidism: Secondary | ICD-10-CM | POA: Diagnosis not present

## 2015-11-16 DIAGNOSIS — E7800 Pure hypercholesterolemia, unspecified: Secondary | ICD-10-CM | POA: Diagnosis not present

## 2015-11-16 DIAGNOSIS — M8580 Other specified disorders of bone density and structure, unspecified site: Secondary | ICD-10-CM | POA: Diagnosis not present

## 2015-11-16 NOTE — Patient Instructions (Signed)
Restart Evista   Stop smoking and exercise daily

## 2015-11-16 NOTE — Progress Notes (Signed)
Patient ID: Victoria Blair, female   DOB: October 13, 1945, 69 y.o.   MRN: [id number]    Reason for Appointment:  Follow-up visit   History of Present Illness:   HYPOTHYROIDISM: This was first diagnosed in 1978 after I-131 treatment for Graves' disease  The treatment that the patient has taken include generic Synthroid.          In 1/15 because of low TSH the dose was reduced to 88 mcg  In 3/16 her TSH was mildly increased even though she was asymptomatic She had been taking a extra half tablet weekly but she appears to be taking only 6-1/2 tablets a week now  She does not complain of any unusual fatigue However his doing well clinically  Her TSH is relatively higher than before   Compliance with the medical regimen has been as prescribed with taking the tablet in the morning before breakfast.  Lab Results  Component Value Date   TSH 2.79 11/11/2015   TSH 0.45 05/19/2015   TSH 0.53 02/01/2015   FREET4 0.92 11/11/2015   FREET4 1.28 05/19/2015   FREET4 1.04 02/01/2015   '  HYPERTENSION: She has been on treatment since 2009 and has been on Lotrel since then with good control.  Currently taking a relatively low dose  Blood pressure is quite normal  inconsistent Does not monitor at home     SMOKING cessation:  She was given a trial of Wellbutrin 100 mg daily to help her quit smoking.  She was also having some anxiety.   With this she feels overall calmer and likes to continue this  She may smoke 2 cigarette a day  and had not been able to quit despite going to a smoking cessation program       Medication List       This list is accurate as of: 11/16/15  8:49 AM.  Always use your most recent med list.               acyclovir ointment 5 %  Commonly known as:  ZOVIRAX  Apply 1 application topically 2 (two) times daily. Apply to affected area twice a day     amlodipine-benazepril 2.5-10 MG capsule  Commonly known as:  LOTREL  TAKE ONE CAPSULE BY MOUTH EVERY  DAY     buPROPion 100 MG 12 hr tablet  Commonly known as:  WELLBUTRIN SR  Take 1 tablet (100 mg total) by mouth daily.     CALCIUM + D PO  Take 1 tablet by mouth daily.     cetirizine 10 MG tablet  Commonly known as:  ZYRTEC  Take 10 mg by mouth daily as needed for allergies.     fish oil-omega-3 fatty acids 1000 MG capsule  Take 1 g by mouth daily.     fluticasone 50 MCG/ACT nasal spray  Commonly known as:  FLONASE  Place 2 sprays into both nostrils daily.     levothyroxine 88 MCG tablet  Commonly known as:  SYNTHROID, LEVOTHROID  TAKE 1 TABLET (88 MCG TOTAL) BY MOUTH DAILY.     meloxicam 7.5 MG tablet  Commonly known as:  MOBIC  TAKE 1 TABLET (7.5 MG TOTAL) BY MOUTH DAILY.     multivitamin tablet  Take 1 tablet by mouth daily.     raloxifene 60 MG tablet  Commonly known as:  EVISTA     ranitidine 150 MG tablet  Commonly known as:  ZANTAC  Take 150 mg by  mouth daily as needed.     valACYclovir 500 MG tablet  Commonly known as:  VALTREX  TAKE ONE TABLET BY MOUTH DAILY FOR 30 DAYS     VITAMIN E PO  Take 1 tablet by mouth daily.        Past Medical History  Diagnosis Date  . GERD (gastroesophageal reflux disease)   . Osteoporosis   . Breast cancer 1997  . Hypertension   . Vitamin D deficiency   . Graves disease   . Migraines   . Hemorrhoids   . Depression     Past Surgical History  Procedure Laterality Date  . Breast lumpectomy    . Cholecystectomy    . Spine surgery    . Cesarean section      Right breast cancer treated with lumpectomy and radiation 2000, lumbar spine surgery     Family History  Problem Relation Age of Onset  . Heart disease Father   . Pulmonary fibrosis Father   . Hypertension Father   . Pancreatic cancer Sister   . Sleep apnea Son   . Diabetes Maternal Aunt     Social History:  reports that she has been smoking Cigarettes.  She has a 12 pack-year smoking history. She does not have any smokeless tobacco history on  file. She reports that she drinks alcohol. She reports that she does not use illicit drugs.  Allergies:  No Known Allergies  REVIEW of systems:  She has gained weight again since her last visit   Wt Readings from Last 3 Encounters:  11/16/15 137 lb 3.2 oz (62.234 kg)  05/19/15 127 lb 9.6 oz (57.879 kg)  03/11/15 127 lb (57.607 kg)    She is followed by pulmonologist for small nodules on her CT scan, these are apparently stable and benign appearing   Hyperlipidemia: She has mild increase in LDL, does not have any other risk factors to need statin treatment  Needs follow-up  Lab Results  Component Value Date   CHOL 173 06/09/2014   HDL 41.20 06/09/2014   LDLCALC 117* 06/09/2014   TRIG 72.0 06/09/2014   CHOLHDL 4 06/09/2014     She has OSTEOPENIA; previously was given Actonel but stopped this because of difficulty with compliance.  T score -2.5  done by gynecologist and she was recommended Fosamax but she did not want to do this because of fear of side effects.    Her bone density is the same at the hip, previously T score was - 2.6 in 2013, previously was -1.7 at spine and in 2013, T score at the hip was -2.3, previously was - 1.1 at left hip.t  Taking Evista since 5/09, and is not regular on this now, refill issue In her youth she was 5 feet 7-1/2 inches tall. No history of fractures. Has also been advised to take OTC vitamin D3 and she is taking 2000 units now since her level is low in 6/16         Examination:   Ht 5\' 7"  (1.702 m)  Wt 137 lb 3.2 oz (62.234 kg)  BMI 21.48 kg/m2      Assessments / PLAN: OSTEOPOROSIS: Asymptomatic with last T score of -2.5 She is not taking Evista recently because of refill issues and not clear if she has requested a refill Will have her start this again and stressed the importance of using this long-term  HYPOTHYROIDISM, post ablative, long-standing She is doing well clinically Her weight gain is related to not  watching her diet  consistently and no exercise  TSH is normal although trending relatively higher with 6 and a half tablets weekly of 88 g levothyroxine, may be needing more because of her weight gain Since she is compliant with this  To simplify her regimen she will take 1 tablet daily  HYPERTENSION: The blood pressure is well-controlled and she can continue the current dose of Lotrel  Smoking cessation: Although she is only smoking 2 cigarettes a day encouraged her to quit completely and continue Wellbutrin  Anxiety/depression: She is doing well with Wellbutrin low dose   Prevnar given   Keasia Dubose 11/16/2015, 8:49 AM

## 2015-11-17 ENCOUNTER — Other Ambulatory Visit: Payer: Self-pay | Admitting: Endocrinology

## 2015-11-21 DIAGNOSIS — H40033 Anatomical narrow angle, bilateral: Secondary | ICD-10-CM | POA: Diagnosis not present

## 2015-11-21 DIAGNOSIS — H2513 Age-related nuclear cataract, bilateral: Secondary | ICD-10-CM | POA: Diagnosis not present

## 2015-11-21 DIAGNOSIS — H35372 Puckering of macula, left eye: Secondary | ICD-10-CM | POA: Diagnosis not present

## 2015-11-21 DIAGNOSIS — H524 Presbyopia: Secondary | ICD-10-CM | POA: Diagnosis not present

## 2015-11-21 DIAGNOSIS — L292 Pruritus vulvae: Secondary | ICD-10-CM | POA: Diagnosis not present

## 2015-12-06 DIAGNOSIS — K219 Gastro-esophageal reflux disease without esophagitis: Secondary | ICD-10-CM | POA: Diagnosis not present

## 2015-12-19 DIAGNOSIS — M25571 Pain in right ankle and joints of right foot: Secondary | ICD-10-CM | POA: Diagnosis not present

## 2015-12-30 ENCOUNTER — Ambulatory Visit
Admission: RE | Admit: 2015-12-30 | Discharge: 2015-12-30 | Disposition: A | Discharge status: Home / Self Care | Payer: Commercial Managed Care - HMO | Source: Ambulatory Visit

## 2015-12-30 DIAGNOSIS — Z1231 Encounter for screening mammogram for malignant neoplasm of breast: Secondary | ICD-10-CM

## 2016-01-03 DIAGNOSIS — S46811A Strain of other muscles, fascia and tendons at shoulder and upper arm level, right arm, initial encounter: Secondary | ICD-10-CM | POA: Diagnosis not present

## 2016-01-31 ENCOUNTER — Ambulatory Visit (INDEPENDENT_AMBULATORY_CARE_PROVIDER_SITE_OTHER)
Admission: RE | Admit: 2016-01-31 | Discharge: 2016-01-31 | Disposition: A | Discharge status: Home / Self Care | Payer: Commercial Managed Care - HMO | Source: Ambulatory Visit | Attending: Pulmonary Disease | Admitting: Pulmonary Disease

## 2016-01-31 DIAGNOSIS — J438 Other emphysema: Secondary | ICD-10-CM

## 2016-01-31 DIAGNOSIS — R911 Solitary pulmonary nodule: Secondary | ICD-10-CM | POA: Diagnosis not present

## 2016-01-31 DIAGNOSIS — Z720 Tobacco use: Secondary | ICD-10-CM | POA: Diagnosis not present

## 2016-02-01 ENCOUNTER — Telehealth: Payer: Self-pay | Admitting: Pulmonary Disease

## 2016-02-01 DIAGNOSIS — Z8639 Personal history of other endocrine, nutritional and metabolic disease: Secondary | ICD-10-CM | POA: Diagnosis not present

## 2016-02-01 DIAGNOSIS — M818 Other osteoporosis without current pathological fracture: Secondary | ICD-10-CM | POA: Diagnosis not present

## 2016-02-01 DIAGNOSIS — Z853 Personal history of malignant neoplasm of breast: Secondary | ICD-10-CM | POA: Diagnosis not present

## 2016-02-01 DIAGNOSIS — Z9229 Personal history of other drug therapy: Secondary | ICD-10-CM | POA: Diagnosis not present

## 2016-02-01 DIAGNOSIS — R29890 Loss of height: Secondary | ICD-10-CM | POA: Diagnosis not present

## 2016-02-01 DIAGNOSIS — Z1321 Encounter for screening for nutritional disorder: Secondary | ICD-10-CM | POA: Diagnosis not present

## 2016-02-01 DIAGNOSIS — Z923 Personal history of irradiation: Secondary | ICD-10-CM | POA: Diagnosis not present

## 2016-02-01 DIAGNOSIS — Z720 Tobacco use: Secondary | ICD-10-CM | POA: Diagnosis not present

## 2016-02-01 DIAGNOSIS — Z79899 Other long term (current) drug therapy: Secondary | ICD-10-CM | POA: Diagnosis not present

## 2016-02-01 NOTE — Telephone Encounter (Signed)
CT chest 01/31/16 >> atherosclerosis, LLL 5 mm GGO nodule no change since 2015, 2 calcified granuloma LLL, mild paraseptal emphysema   Will have my nurse inform pt that CT chest did not show any progression of lung nodule in bottom of Lt lung.  Will discuss in more detail at her next office visit.

## 2016-02-03 DIAGNOSIS — M79672 Pain in left foot: Secondary | ICD-10-CM | POA: Diagnosis not present

## 2016-02-07 NOTE — Telephone Encounter (Signed)
Results have been explained to patient, pt expressed understanding. Nothing further needed.  

## 2016-02-10 DIAGNOSIS — M5136 Other intervertebral disc degeneration, lumbar region: Secondary | ICD-10-CM | POA: Diagnosis not present

## 2016-02-10 DIAGNOSIS — M4124 Other idiopathic scoliosis, thoracic region: Secondary | ICD-10-CM | POA: Diagnosis not present

## 2016-02-10 DIAGNOSIS — M47816 Spondylosis without myelopathy or radiculopathy, lumbar region: Secondary | ICD-10-CM | POA: Diagnosis not present

## 2016-02-10 DIAGNOSIS — R29890 Loss of height: Secondary | ICD-10-CM | POA: Diagnosis not present

## 2016-02-10 DIAGNOSIS — Z79899 Other long term (current) drug therapy: Secondary | ICD-10-CM | POA: Diagnosis not present

## 2016-02-10 DIAGNOSIS — M818 Other osteoporosis without current pathological fracture: Secondary | ICD-10-CM | POA: Diagnosis not present

## 2016-02-16 ENCOUNTER — Telehealth: Payer: Self-pay | Admitting: Endocrinology

## 2016-02-16 NOTE — Telephone Encounter (Signed)
Patient has question about medication fosamax, please advise

## 2016-02-18 DIAGNOSIS — B373 Candidiasis of vulva and vagina: Secondary | ICD-10-CM | POA: Diagnosis not present

## 2016-02-18 DIAGNOSIS — R102 Pelvic and perineal pain: Secondary | ICD-10-CM | POA: Diagnosis not present

## 2016-02-18 DIAGNOSIS — R300 Dysuria: Secondary | ICD-10-CM | POA: Diagnosis not present

## 2016-02-23 DIAGNOSIS — M542 Cervicalgia: Secondary | ICD-10-CM | POA: Diagnosis not present

## 2016-03-14 DIAGNOSIS — L989 Disorder of the skin and subcutaneous tissue, unspecified: Secondary | ICD-10-CM | POA: Diagnosis not present

## 2016-03-14 DIAGNOSIS — H6093 Unspecified otitis externa, bilateral: Secondary | ICD-10-CM | POA: Diagnosis not present

## 2016-03-23 DIAGNOSIS — R51 Headache: Secondary | ICD-10-CM | POA: Diagnosis not present

## 2016-03-27 ENCOUNTER — Ambulatory Visit (INDEPENDENT_AMBULATORY_CARE_PROVIDER_SITE_OTHER): Payer: Commercial Managed Care - HMO | Admitting: Pulmonary Disease

## 2016-03-27 ENCOUNTER — Encounter: Payer: Self-pay | Admitting: Pulmonary Disease

## 2016-03-27 VITALS — BP 134/76 | HR 86 | Ht 67.0 in | Wt 139.6 lb

## 2016-03-27 DIAGNOSIS — Z720 Tobacco use: Secondary | ICD-10-CM

## 2016-03-27 DIAGNOSIS — F17210 Nicotine dependence, cigarettes, uncomplicated: Secondary | ICD-10-CM | POA: Diagnosis not present

## 2016-03-27 DIAGNOSIS — J438 Other emphysema: Secondary | ICD-10-CM

## 2016-03-27 DIAGNOSIS — R911 Solitary pulmonary nodule: Secondary | ICD-10-CM | POA: Diagnosis not present

## 2016-03-27 NOTE — Assessment & Plan Note (Signed)
Stable Lung nodule in patient who is a current smoker. Plan: We will schedule you for a follow up CT Chest in 01/2017 Follow up in 1 year after CT Chest with Dr. Halford Chessman Please contact office for sooner follow up if symptoms do not improve or worsen or seek emergency care

## 2016-03-27 NOTE — Progress Notes (Signed)
History of Present Illness Victoria Blair is a 70 y.o. female smoker  with lung nodule followed by Dr. Halford Chessman.   Past medical hx >> GERD, Breast cancer 1997, Osteoporosis, HTN, Graves disease, Migraines  Past surgical hx, Medications, Allergies, Family hx, Social hx all reviewed.  03/27/2016 Follow up appointment for pulmonary nodule. She is doing well. She is smoking approximately 5 cigarettes daily. She did go through the [employer] classes at [facility] in the past.She denies fever, chest pain, cough, wheeze ,sputum, orthopnea or hemoptysis. She is retired and does have some stress, but is managing it with Tai Chi and water aerobics.She has gained some weight since retiring which she feels is healthy.  Tests PFT 01/05/14 >> FEV1 1.80 (64%), FEV1% 74, TLC 4.44 (78%), DLCO 65% CT chest 01/07/14 >> biapical scarring, 6 mm nodule LLL, 5 mm nodule LLL CT chest 07/14/14 >> biapical scarring, 6 mm nodule LLL, 5 mm nodule LLL CT chest 02/16/15 >> mild centrilobular and paraseptal emphysema, no change to slight improvement in nodules CT Chest 01/31/2016:>> 5 mm left lower lobe nodule, unchanged since 2015, benign,Mild paraseptal emphysematous changes, upper lobe predominant  Past medical hx Past Medical History:  Diagnosis Date  . Breast cancer ([place]) 1997  . Depression   . GERD (gastroesophageal reflux disease)   . Graves disease   . Hemorrhoids   . Hypertension   . Migraines   . Osteoporosis   . Vitamin D deficiency      Past surgical hx, Family hx, Social hx all reviewed.  Current Outpatient Prescriptions on File Prior to Visit  Medication Sig  . acyclovir ointment (ZOVIRAX) 5 % Apply 1 application topically 2 (two) times daily. Apply to affected area twice a day  . amlodipine-benazepril (LOTREL) 2.5-10 MG capsule TAKE ONE CAPSULE BY MOUTH EVERY DAY  . buPROPion (WELLBUTRIN SR) 100 MG 12 hr tablet TAKE 1 TABLET (100 MG TOTAL) BY MOUTH DAILY.  . Calcium Carbonate-Vitamin D (CALCIUM  + D PO) Take 1 tablet by mouth daily.   . cetirizine (ZYRTEC) 10 MG tablet Take 10 mg by mouth daily as needed for allergies.  . fish oil-omega-3 fatty acids 1000 MG capsule Take 1 g by mouth daily.   . fluticasone (FLONASE) 50 MCG/ACT nasal spray Place 2 sprays into both nostrils daily.  Marland Kitchen levothyroxine (SYNTHROID, LEVOTHROID) 88 MCG tablet TAKE 1 TABLET (88 MCG TOTAL) BY MOUTH DAILY.  . meloxicam (MOBIC) 7.5 MG tablet TAKE 1 TABLET (7.5 MG TOTAL) BY MOUTH DAILY. (Patient taking differently: PRN)  . Multiple Vitamin (MULTIVITAMIN) tablet Take 1 tablet by mouth daily.  . ranitidine (ZANTAC) 150 MG tablet Take 150 mg by mouth daily as needed.   Marland Kitchen VITAMIN E PO Take 1 tablet by mouth daily.   No current facility-administered medications on file prior to visit.      No Known Allergies  Review Of Systems:  Constitutional:   No  weight loss, night sweats,  Fevers, chills, fatigue, or  lassitude.  HEENT:   No headaches,  Difficulty swallowing,  Tooth/dental problems, or  Sore throat,                No sneezing, itching, ear ache, nasal congestion, post nasal drip,   CV:  No chest pain,  Orthopnea, PND, swelling in lower extremities, anasarca, dizziness, palpitations, syncope.   GI  + heartburn, no indigestion, abdominal pain, nausea, vomiting, diarrhea, change in bowel habits, loss of appetite, bloody stools.   Resp: No shortness  of breath with exertion or at rest.  No excess mucus, no productive cough,  No non-productive cough,  No coughing up of blood.  No change in color of mucus.  No wheezing.  No chest wall deformity  Skin: no rash or lesions.  GU: no dysuria, change in color of urine, no urgency or frequency.  No flank pain, no hematuria   MS:  No joint pain or swelling.  No decreased range of motion.  No back pain.  Psych:  No change in mood or affect. No depression or anxiety.  No memory loss.   Vital Signs BP 134/76 (BP Location: Left Arm, Cuff Size: Normal)   Pulse 86   Ht  '5\' 7"'$  (1.702 m)   Wt 139 lb 9.6 oz (63.3 kg)   SpO2 98%   BMI 21.86 kg/m    Physical Exam:  General- No distress,  A&Ox3, pleasant ENT: No sinus tenderness, TM clear, pale nasal mucosa, no oral exudate,no post nasal drip, no LAN Cardiac: S1, S2, regular rate and rhythm, no murmur Chest: No wheeze/ rales/ diminished per bases,; no accessory muscle use, no nasal flaring, no sternal retractions Abd.: Soft, Non-tender Ext: No clubbing cyanosis, edema Neuro:  normal strength Skin: No rashes, warm and dry Psych: normal mood and behavior   Assessment/Plan  Lung nodule Stable Lung nodule in patient who is a current smoker. Plan: We will schedule you for a follow up CT Chest in 01/2017 Follow up in 1 year after CT Chest with Dr. Halford Chessman Please contact office for sooner follow up if symptoms do not improve or worsen or seek emergency care     Tobacco abuse Continues to smoke  approximately 5 cigarettes daily Stable Lung Nodule Slight progressive changes of emphysema per PFT's and CT. Has participated in the [place] [facility]oking Cessation Classes Plan: Please try to quit smoking. It is the single most powerful cation you can take to decrease your risk of lung cancer. Provided card with information and support for smoking cessation/ free tobacco replacement medication.     Magdalen Spatz, NP 03/27/2016  4:14 PM  She continues to smoke few cigarettes per day.  She denies cough, wheeze, chest congestion, chest pain, leg pain.  Tests PFT 01/05/14 >> FEV1 1.80 (64%), FEV1% 74, TLC 4.44 (78%), DLCO 65% CT chest 01/07/14 >> biapical scarring, 6 mm nodule LLL, 5 mm nodule LLL CT chest 07/14/14 >> biapical scarring, 6 mm nodule LLL, 5 mm nodule LLL CT chest 02/16/15 >> mild centrilobular and paraseptal emphysema, no change to slight improvement in nodules CT chest 01/31/16 >> atherosclerosis, LLL 5 mm GGO nodule no change since 2015, 2 calcified granuloma LLL, mild paraseptal emphysema  Past  medical history GERD, Breast cancer 1997, Osteoporosis, HTN, Graves disease, Migraines  Thin, no sinus tenderness, no oral exudate, no wheeze, HR regular, Abd soft, no edema.  Assessment/plan:  Lt lower lobe ground glass nodule. - stable - f/u CT chest w/o contrast for June 2018  Tobacco abuse with changes of emphysema on CT chest and mild diffusion defect on previous PFT. - emphasized need to stop smoking - monitor clinically for progression   Patient Instructions  It is nice to meet you today. We will schedule you for a follow up CT in 01/2017 Please try to quit smoking. It is the single most powerful cation you can take to decrease your risk of lung cancer. Follow up in 1 year after CT Chest with Dr. Halford Chessman Please contact office for  sooner follow up if symptoms do not improve or worsen or seek emergency care     Chesley Mires, MD [place] 03/27/2016, 4:26 PM Pager:  [phone] After 3pm call: [phone]

## 2016-03-27 NOTE — Patient Instructions (Addendum)
It is nice to meet you today. We will schedule you for a follow up CT in 01/2017 Please try to quit smoking. It is the single most powerful cation you can take to decrease your risk of lung cancer. Follow up in 1 year after CT Chest with Dr. Halford Chessman Please contact office for sooner follow up if symptoms do not improve or worsen or seek emergency care

## 2016-03-27 NOTE — Addendum Note (Signed)
Addended by: Virl Cagey on: 03/27/2016 04:37 PM   Modules accepted: Orders

## 2016-03-27 NOTE — Assessment & Plan Note (Addendum)
Continues to smoke  approximately 5 cigarettes daily Stable Lung Nodule Slight progressive changes of emphysema per PFT's and CT. Has participated in the [place] Smoking Cessation Classes Plan: Please try to quit smoking. It is the single most powerful cation you can take to decrease your risk of lung cancer. Provided card with information and support for smoking cessation/ free tobacco replacement medication.

## 2016-04-18 DIAGNOSIS — M2550 Pain in unspecified joint: Secondary | ICD-10-CM | POA: Diagnosis not present

## 2016-04-18 DIAGNOSIS — K219 Gastro-esophageal reflux disease without esophagitis: Secondary | ICD-10-CM | POA: Diagnosis not present

## 2016-04-23 ENCOUNTER — Other Ambulatory Visit: Payer: Self-pay | Admitting: Endocrinology

## 2016-05-14 ENCOUNTER — Other Ambulatory Visit: Payer: Commercial Managed Care - HMO

## 2016-05-17 ENCOUNTER — Ambulatory Visit: Payer: Commercial Managed Care - HMO | Admitting: Endocrinology

## 2016-05-17 DIAGNOSIS — D3132 Benign neoplasm of left choroid: Secondary | ICD-10-CM | POA: Diagnosis not present

## 2016-05-17 DIAGNOSIS — H35372 Puckering of macula, left eye: Secondary | ICD-10-CM | POA: Diagnosis not present

## 2016-05-21 ENCOUNTER — Other Ambulatory Visit: Payer: Self-pay | Admitting: Endocrinology

## 2016-05-24 ENCOUNTER — Other Ambulatory Visit (INDEPENDENT_AMBULATORY_CARE_PROVIDER_SITE_OTHER): Payer: Commercial Managed Care - HMO

## 2016-05-24 DIAGNOSIS — E7800 Pure hypercholesterolemia, unspecified: Secondary | ICD-10-CM

## 2016-05-24 DIAGNOSIS — M8580 Other specified disorders of bone density and structure, unspecified site: Secondary | ICD-10-CM | POA: Diagnosis not present

## 2016-05-24 DIAGNOSIS — L578 Other skin changes due to chronic exposure to nonionizing radiation: Secondary | ICD-10-CM | POA: Diagnosis not present

## 2016-05-24 DIAGNOSIS — E890 Postprocedural hypothyroidism: Secondary | ICD-10-CM

## 2016-05-24 DIAGNOSIS — L814 Other melanin hyperpigmentation: Secondary | ICD-10-CM | POA: Diagnosis not present

## 2016-05-24 DIAGNOSIS — L821 Other seborrheic keratosis: Secondary | ICD-10-CM | POA: Diagnosis not present

## 2016-05-24 DIAGNOSIS — L820 Inflamed seborrheic keratosis: Secondary | ICD-10-CM | POA: Diagnosis not present

## 2016-05-24 LAB — COMPREHENSIVE METABOLIC PANEL
ALBUMIN: 3.9 g/dL (ref 3.5–5.2)
ALK PHOS: 49 U/L (ref 39–117)
ALT: 17 U/L (ref 0–35)
AST: 22 U/L (ref 0–37)
BUN: 17 mg/dL (ref 6–23)
CALCIUM: 9.1 mg/dL (ref 8.4–10.5)
CHLORIDE: 103 meq/L (ref 96–112)
CO2: 31 mEq/L (ref 19–32)
Creatinine, Ser: 0.79 mg/dL (ref 0.40–1.20)
GFR: 76.87 mL/min (ref >60.00)
Glucose, Bld: 90 mg/dL (ref 70–99)
POTASSIUM: 4.3 meq/L (ref 3.5–5.1)
SODIUM: 140 meq/L (ref 135–145)
TOTAL PROTEIN: 6.8 g/dL (ref 6.0–8.3)
Total Bilirubin: 0.5 mg/dL (ref 0.2–1.2)

## 2016-05-24 LAB — LIPID PANEL
Cholesterol: 188 mg/dL (ref 0–200)
HDL: 46.6 mg/dL (ref >39.00)
LDL CALC: 125 mg/dL — AB (ref 0–99)
NONHDL: 141.47
Total CHOL/HDL Ratio: 4
Triglycerides: 81 mg/dL (ref 0.0–149.0)
VLDL: 16.2 mg/dL (ref 0.0–40.0)

## 2016-05-24 LAB — T4, FREE: Free T4: 0.95 ng/dL (ref 0.60–1.60)

## 2016-05-24 LAB — TSH: TSH: 1.62 u[IU]/mL (ref 0.35–4.50)

## 2016-05-25 ENCOUNTER — Other Ambulatory Visit: Payer: Commercial Managed Care - HMO

## 2016-05-30 ENCOUNTER — Ambulatory Visit: Payer: Commercial Managed Care - HMO | Admitting: Endocrinology

## 2016-05-31 ENCOUNTER — Ambulatory Visit (INDEPENDENT_AMBULATORY_CARE_PROVIDER_SITE_OTHER): Payer: Commercial Managed Care - HMO | Admitting: Endocrinology

## 2016-05-31 ENCOUNTER — Encounter: Payer: Self-pay | Admitting: Endocrinology

## 2016-05-31 VITALS — BP 120/72 | HR 89 | Temp 98.1°F | Resp 14 | Ht 67.0 in | Wt 136.0 lb

## 2016-05-31 DIAGNOSIS — M810 Age-related osteoporosis without current pathological fracture: Secondary | ICD-10-CM | POA: Diagnosis not present

## 2016-05-31 DIAGNOSIS — E890 Postprocedural hypothyroidism: Secondary | ICD-10-CM

## 2016-05-31 DIAGNOSIS — Z23 Encounter for immunization: Secondary | ICD-10-CM | POA: Diagnosis not present

## 2016-05-31 MED ORDER — RALOXIFENE HCL 60 MG PO TABS: 60.0000 mg | ORAL_TABLET | Freq: Every day | ORAL | 5 refills | Status: DC

## 2016-05-31 NOTE — Progress Notes (Signed)
Patient ID: Victoria Blair, female   DOB: [birth year], 70 y.o.   MRN: [id number]    Reason for Appointment:  Follow-up visit   History of Present Illness:   HYPOTHYROIDISM: This was first diagnosed in 1978 after I-131 treatment for Graves' disease  The treatment that the patient has taken include generic Synthroid.          In 1/15 because of low TSH the dose was reduced to 88 mcg  In 3/16 her TSH was mildly increased even though she was asymptomatic  She is currently taking 88 g once daily  She does not complain of any unusual fatigue TSH is now quite normal Compliance with the medical regimen has been as prescribed with taking the tablet in the morning before breakfast.  Lab Results  Component Value Date   TSH 1.62 05/24/2016   TSH 2.79 11/11/2015   TSH 0.45 05/19/2015   FREET4 0.95 05/24/2016   FREET4 0.92 11/11/2015   FREET4 1.28 05/19/2015   '  HYPERTENSION: She has been on treatment since 2009 and has been on Lotrel since then with good control.   Blood pressure is quite normal   Does not monitor at home     SMOKING cessation:  She was given a trial of Wellbutrin 100 mg daily to help her quit smoking.  She was also having some anxiety.   With this she feels overall calmer andContinues to take this She is not smoking everyday now and only 1 or 2 at a time     Medication List       Accurate as of 05/31/16  3:09 PM. Always use your most recent med list.          acyclovir ointment 5 % Commonly known as:  ZOVIRAX Apply 1 application topically 2 (two) times daily. Apply to affected area twice a day   amlodipine-benazepril 2.5-10 MG capsule Commonly known as:  LOTREL TAKE ONE CAPSULE BY MOUTH EVERY DAY   buPROPion 100 MG 12 hr tablet Commonly known as:  WELLBUTRIN SR TAKE 1 TABLET (100 MG TOTAL) BY MOUTH DAILY.   CALCIUM + D PO Take 1 tablet by mouth daily.   cetirizine 10 MG tablet Commonly known as:  ZYRTEC Take 10 mg by mouth daily  as needed for allergies.   fish oil-omega-3 fatty acids 1000 MG capsule Take 1 g by mouth daily.   fluticasone 50 MCG/ACT nasal spray Commonly known as:  FLONASE Place 2 sprays into both nostrils daily.   levothyroxine 88 MCG tablet Commonly known as:  SYNTHROID, LEVOTHROID TAKE 1 TABLET (88 MCG TOTAL) BY MOUTH DAILY.   meloxicam 7.5 MG tablet Commonly known as:  MOBIC TAKE 1 TABLET (7.5 MG TOTAL) BY MOUTH DAILY.   multivitamin tablet Take 1 tablet by mouth daily.   ranitidine 150 MG tablet Commonly known as:  ZANTAC Take 150 mg by mouth daily as needed.   VITAMIN E PO Take 1 tablet by mouth daily.       Past Medical History:  Diagnosis Date  . Breast cancer ([place]) 1997  . Depression   . GERD (gastroesophageal reflux disease)   . Graves disease   . Hemorrhoids   . Hypertension   . Migraines   . Osteoporosis   . Vitamin D deficiency     Past Surgical History:  Procedure Laterality Date  . BREAST LUMPECTOMY    . CESAREAN SECTION    . CHOLECYSTECTOMY    . SPINE SURGERY  Right breast cancer treated with lumpectomy and radiation 2000, lumbar spine surgery     Family History  Problem Relation Age of Onset  . Heart disease Father   . Pulmonary fibrosis Father   . Hypertension Father   . Pancreatic cancer Sister   . Sleep apnea Son   . Diabetes Maternal Aunt     Social History:  reports that she has been smoking Cigarettes.  She has a 12.00 pack-year smoking history. She does not have any smokeless tobacco history on file. She reports that she drinks alcohol. She reports that she does not use drugs.  Allergies:  No Known Allergies  REVIEW of systems:  She has lost weight  since her last visit   Wt Readings from Last 3 Encounters:  05/31/16 136 lb (61.7 kg)  03/27/16 139 lb 9.6 oz (63.3 kg)  11/16/15 137 lb 3.2 oz (62.2 kg)     Hyperlipidemia: She has mild increase in LDL, does not have any other risk factors to need statin treatment  She  thinks she is following a relatively healthy diet    Lab Results  Component Value Date   CHOL 188 05/24/2016   HDL 46.60 05/24/2016   LDLCALC 125 (H) 05/24/2016   TRIG 81.0 05/24/2016   CHOLHDL 4 05/24/2016     She has OSTEOPENIA; previously was given Actonel but stopped this because of difficulty with compliance.  T score -2.5  done by gynecologist     Her bone density is the same at the hip, previously T score was - 2.6 in 2013, previously was -1.7 at spine and in 2013, T score at the hip was -2.3, previously was - 1.1 at left hip.  By the gynecologist she was recommended Fosamax but she did not want to do this because of fear of side effects.  She was taking Evista since 5/09, and is not regular on this  Even though she was told to start back she is not clear whether she had difficulty at the pharmacist level or insurance level with not getting the medication  In her youth she was 5 feet 7-1/2 inches tall. No history of fractures.  Has also been advised to take OTC vitamin D3 and she is taking 2000 units now since her level is low in 6/16         Examination:   BP 120/72   Pulse 89   Temp 98.1 F (36.7 C)   Resp 14   Ht 5\' 7"  (1.702 m)   Wt 136 lb (61.7 kg)   SpO2 95%   BMI 21.30 kg/m       Assessments / PLAN: OSTEOPOROSIS: Asymptomatic with last T score of -2.5 She is not taking Evista again She agrees to start this back   HYPOTHYROIDISM, post ablative, long-standing She is doing well clinically  TSH is normal with 88 g and she is doing well subjectively  HYPERTENSION: The blood pressure is well-controlled and she can continue the current dose of Lotrel  Smoking cessation: Although she is almost quit she occasionally does smoke  Anxiety/depression: She is doing well with Wellbutrin low dose   High-dose influenza vaccine given  Joanna Hall 05/31/2016, 3:09 PM

## 2016-05-31 NOTE — Patient Instructions (Signed)
Restart Evista

## 2016-06-10 ENCOUNTER — Other Ambulatory Visit: Payer: Self-pay | Admitting: Endocrinology

## 2016-06-18 DIAGNOSIS — H6093 Unspecified otitis externa, bilateral: Secondary | ICD-10-CM | POA: Diagnosis not present

## 2016-07-10 DIAGNOSIS — N898 Other specified noninflammatory disorders of vagina: Secondary | ICD-10-CM | POA: Diagnosis not present

## 2016-07-23 DIAGNOSIS — F17210 Nicotine dependence, cigarettes, uncomplicated: Secondary | ICD-10-CM | POA: Diagnosis not present

## 2016-07-23 DIAGNOSIS — H608X3 Other otitis externa, bilateral: Secondary | ICD-10-CM | POA: Diagnosis not present

## 2016-07-23 DIAGNOSIS — H6042 Cholesteatoma of left external ear: Secondary | ICD-10-CM | POA: Diagnosis not present

## 2016-07-23 DIAGNOSIS — H6122 Impacted cerumen, left ear: Secondary | ICD-10-CM | POA: Diagnosis not present

## 2016-08-24 DIAGNOSIS — R142 Eructation: Secondary | ICD-10-CM | POA: Diagnosis not present

## 2016-08-24 DIAGNOSIS — R1013 Epigastric pain: Secondary | ICD-10-CM | POA: Diagnosis not present

## 2016-09-21 DIAGNOSIS — R300 Dysuria: Secondary | ICD-10-CM | POA: Diagnosis not present

## 2016-10-02 DIAGNOSIS — K219 Gastro-esophageal reflux disease without esophagitis: Secondary | ICD-10-CM | POA: Diagnosis not present

## 2016-10-02 DIAGNOSIS — Z124 Encounter for screening for malignant neoplasm of cervix: Secondary | ICD-10-CM | POA: Diagnosis not present

## 2016-10-02 DIAGNOSIS — E890 Postprocedural hypothyroidism: Secondary | ICD-10-CM | POA: Diagnosis not present

## 2016-10-02 DIAGNOSIS — Z23 Encounter for immunization: Secondary | ICD-10-CM | POA: Diagnosis not present

## 2016-10-02 DIAGNOSIS — D509 Iron deficiency anemia, unspecified: Secondary | ICD-10-CM | POA: Diagnosis not present

## 2016-10-02 DIAGNOSIS — Z13220 Encounter for screening for lipoid disorders: Secondary | ICD-10-CM | POA: Diagnosis not present

## 2016-10-02 DIAGNOSIS — I10 Essential (primary) hypertension: Secondary | ICD-10-CM | POA: Diagnosis not present

## 2016-10-02 DIAGNOSIS — Z0000 Encounter for general adult medical examination without abnormal findings: Secondary | ICD-10-CM | POA: Diagnosis not present

## 2016-10-05 ENCOUNTER — Other Ambulatory Visit: Payer: Self-pay | Admitting: Endocrinology

## 2016-11-02 ENCOUNTER — Other Ambulatory Visit: Payer: Self-pay | Admitting: Endocrinology

## 2016-11-07 DIAGNOSIS — H6042 Cholesteatoma of left external ear: Secondary | ICD-10-CM | POA: Diagnosis not present

## 2016-11-10 ENCOUNTER — Other Ambulatory Visit: Payer: Self-pay | Admitting: Endocrinology

## 2016-11-12 ENCOUNTER — Other Ambulatory Visit: Payer: Self-pay | Admitting: Endocrinology

## 2016-11-26 ENCOUNTER — Other Ambulatory Visit (INDEPENDENT_AMBULATORY_CARE_PROVIDER_SITE_OTHER): Payer: Medicare HMO

## 2016-11-26 DIAGNOSIS — E890 Postprocedural hypothyroidism: Secondary | ICD-10-CM

## 2016-11-26 DIAGNOSIS — M810 Age-related osteoporosis without current pathological fracture: Secondary | ICD-10-CM | POA: Diagnosis not present

## 2016-11-26 LAB — BASIC METABOLIC PANEL
BUN: 19 mg/dL (ref 6–23)
CO2: 31 meq/L (ref 19–32)
Calcium: 9.3 mg/dL (ref 8.4–10.5)
Chloride: 104 mEq/L (ref 96–112)
Creatinine, Ser: 0.75 mg/dL (ref 0.40–1.20)
GFR: 81.5 mL/min (ref >60.00)
GLUCOSE: 160 mg/dL — AB (ref 70–99)
POTASSIUM: 3.7 meq/L (ref 3.5–5.1)
SODIUM: 141 meq/L (ref 135–145)

## 2016-11-26 LAB — TSH: TSH: 0.36 u[IU]/mL (ref 0.35–4.50)

## 2016-11-26 LAB — T4, FREE: Free T4: 1.31 ng/dL (ref 0.60–1.60)

## 2016-11-27 ENCOUNTER — Other Ambulatory Visit (INDEPENDENT_AMBULATORY_CARE_PROVIDER_SITE_OTHER): Payer: Medicare HMO

## 2016-11-27 DIAGNOSIS — M810 Age-related osteoporosis without current pathological fracture: Secondary | ICD-10-CM | POA: Diagnosis not present

## 2016-11-27 DIAGNOSIS — R911 Solitary pulmonary nodule: Secondary | ICD-10-CM

## 2016-11-27 DIAGNOSIS — J438 Other emphysema: Secondary | ICD-10-CM

## 2016-11-27 DIAGNOSIS — Z720 Tobacco use: Secondary | ICD-10-CM

## 2016-11-27 LAB — VITAMIN D 25 HYDROXY (VIT D DEFICIENCY, FRACTURES): VITD: 31.22 ng/mL (ref 30.00–100.00)

## 2016-11-29 ENCOUNTER — Other Ambulatory Visit: Payer: Self-pay | Admitting: Obstetrics and Gynecology

## 2016-11-29 ENCOUNTER — Ambulatory Visit (INDEPENDENT_AMBULATORY_CARE_PROVIDER_SITE_OTHER): Payer: Medicare HMO | Admitting: Endocrinology

## 2016-11-29 ENCOUNTER — Encounter: Payer: Self-pay | Admitting: Endocrinology

## 2016-11-29 VITALS — BP 132/64 | HR 94 | Ht 67.0 in | Wt 129.0 lb

## 2016-11-29 DIAGNOSIS — E063 Autoimmune thyroiditis: Secondary | ICD-10-CM

## 2016-11-29 DIAGNOSIS — I10 Essential (primary) hypertension: Secondary | ICD-10-CM | POA: Diagnosis not present

## 2016-11-29 DIAGNOSIS — E038 Other specified hypothyroidism: Secondary | ICD-10-CM | POA: Diagnosis not present

## 2016-11-29 NOTE — Progress Notes (Signed)
Patient ID: Victoria Blair, female   DOB: 1945/09/11, 71 y.o.   MRN: [id number]    Reason for Appointment:  Follow-up visit   History of Present Illness:   HYPOTHYROIDISM: This was first diagnosed in 1978 after I-131 treatment for Graves' disease  The treatment that the patient has taken include generic Synthroid.          In 1/15 because of low TSH the dose was reduced to 88 mcg  In 3/16 her TSH was mildly increased even though she was asymptomatic  She is currently taking 88 g once daily  She does not complain of any unusual fatigue TSH is now quite normal Compliance with the medical regimen has been as prescribed with taking the tablet in the morning before breakfast.  Lab Results  Component Value Date   TSH 0.36 11/26/2016   TSH 1.62 05/24/2016   TSH 2.79 11/11/2015   FREET4 1.31 11/26/2016   FREET4 0.95 05/24/2016   FREET4 0.92 11/11/2015   '  HYPERTENSION: She has been on treatment since 2009 and has been on Lotrel since then with good control.   Blood pressure is quite normal    Does not monitor at home     SMOKING cessation:  She was given a trial of Wellbutrin 100 mg daily to help her quit smoking.  She was also having some anxiety.   With this she feels overall calmer andContinues to take this She is not smoking everyday now and only 1 or 2 at a time   Allergies as of 11/29/2016      Reactions   Erythromycin Base Other (See Comments)   Other      Medication List       Accurate as of 11/29/16  3:08 PM. Always use your most recent med list.          acyclovir ointment 5 % Commonly known as:  ZOVIRAX Apply 1 application topically 2 (two) times daily. Apply to affected area twice a day   amlodipine-benazepril 2.5-10 MG capsule Commonly known as:  LOTREL TAKE ONE CAPSULE BY MOUTH EVERY DAY   buPROPion 100 MG 12 hr tablet Commonly known as:  WELLBUTRIN SR TAKE 1 TABLET (100 MG TOTAL) BY MOUTH DAILY.   CALCIUM + D PO Take 1 tablet by  mouth daily.   cetirizine 10 MG tablet Commonly known as:  ZYRTEC Take 10 mg by mouth daily as needed for allergies.   fish oil-omega-3 fatty acids 1000 MG capsule Take 1 g by mouth daily.   fluticasone 50 MCG/ACT nasal spray Commonly known as:  FLONASE Place 2 sprays into both nostrils daily.   levothyroxine 88 MCG tablet Commonly known as:  SYNTHROID, LEVOTHROID TAKE 1 TABLET (88 MCG TOTAL) BY MOUTH DAILY.   meloxicam 7.5 MG tablet Commonly known as:  MOBIC TAKE 1 TABLET (7.5 MG TOTAL) BY MOUTH DAILY.   multivitamin tablet Take 1 tablet by mouth daily.   raloxifene 60 MG tablet Commonly known as:  EVISTA Take 1 tablet (60 mg total) by mouth daily.   ranitidine 150 MG tablet Commonly known as:  ZANTAC Take 150 mg by mouth daily as needed.   VITAMIN E PO Take 1 tablet by mouth daily.       Past Medical History:  Diagnosis Date  . Breast cancer ([place]) 1997  . Depression   . GERD (gastroesophageal reflux disease)   . Graves disease   . Hemorrhoids   . Hypertension   .  Migraines   . Osteoporosis   . Vitamin D deficiency     Past Surgical History:  Procedure Laterality Date  . BREAST LUMPECTOMY    . CESAREAN SECTION    . CHOLECYSTECTOMY    . SPINE SURGERY      Right breast cancer treated with lumpectomy and radiation 2000, lumbar spine surgery     Family History  Problem Relation Age of Onset  . Heart disease Father   . Pulmonary fibrosis Father   . Hypertension Father   . Pancreatic cancer Sister   . Sleep apnea Son   . Diabetes Maternal Aunt     Social History:  reports that she has been smoking Cigarettes.  She has a 12.00 pack-year smoking history. She has never used smokeless tobacco. She reports that she drinks alcohol. She reports that she does not use drugs.  Allergies:  Allergies  Allergen Reactions  . Erythromycin Base Other (See Comments)    Other    REVIEW of systems:  She has lost weight since her last visit   Wt Readings  from Last 3 Encounters:  11/29/16 129 lb (58.5 kg)  05/31/16 136 lb (61.7 kg)  03/27/16 139 lb 9.6 oz (63.3 kg)     Hyperlipidemia: She has mild increase in LDL, does not have any other risk factors to need statin treatment  She thinks she is following a relatively healthy diet    Lab Results  Component Value Date   CHOL 188 05/24/2016   HDL 46.60 05/24/2016   LDLCALC 125 (H) 05/24/2016   TRIG 81.0 05/24/2016   CHOLHDL 4 05/24/2016     She has OSTEOPENIA; previously was given Actonel but stopped this because of difficulty with compliance.  T score -2.5  done by gynecologist in 2016   Her bone density is the same at the hip, previously T score was - 2.6 in 2013, previously was -1.7 at spine and in 2013, T score at the hip was -2.3, previously was - 1.1 at left hip.  By the gynecologist she was recommended Fosamax but she did not want to do this because of fear of side effects.   She was taking Evista since 5/09, and is not regular on this   In her youth she was 5 feet 7-1/2 inches tall. No history of fractures.  Has also been advised to take OTC vitamin D3 and she is taking 2000 units now since her level is low in 6/16          Examination:   BP 132/64   Pulse 94   Ht 5\' 7"  (1.702 m)   Wt 129 lb (58.5 kg)   SpO2 96%   BMI 20.20 kg/m       Assessments / PLAN:  OSTEOPOROSIS: Asymptomatic with last T score of -2.5 She is not taking Evista again She agrees to start this back   HYPOTHYROIDISM, post ablative, long-standing She is doing well clinically  TSH is normal with 88 g and she is doing well subjectively  HYPERTENSION: The blood pressure is well-controlled and she can continue the current dose of Lotrel  Smoking cessation: Although she is almost quit she occasionally does smoke  Anxiety/depression: She is doing well with Wellbutrin low dose    Mirta Mally 11/29/2016, 3:08 PM

## 2016-11-29 NOTE — Patient Instructions (Signed)
Take 6 1/2 pills weekly 

## 2016-12-02 DIAGNOSIS — L739 Follicular disorder, unspecified: Secondary | ICD-10-CM | POA: Diagnosis not present

## 2016-12-13 DIAGNOSIS — D485 Neoplasm of uncertain behavior of skin: Secondary | ICD-10-CM | POA: Diagnosis not present

## 2016-12-13 DIAGNOSIS — S20362A Insect bite (nonvenomous) of left front wall of thorax, initial encounter: Secondary | ICD-10-CM | POA: Diagnosis not present

## 2016-12-13 DIAGNOSIS — L218 Other seborrheic dermatitis: Secondary | ICD-10-CM | POA: Diagnosis not present

## 2017-01-04 DIAGNOSIS — J329 Chronic sinusitis, unspecified: Secondary | ICD-10-CM | POA: Diagnosis not present

## 2017-01-18 DIAGNOSIS — H7192 Unspecified cholesteatoma, left ear: Secondary | ICD-10-CM | POA: Diagnosis not present

## 2017-01-18 DIAGNOSIS — H6042 Cholesteatoma of left external ear: Secondary | ICD-10-CM | POA: Diagnosis not present

## 2017-01-22 DIAGNOSIS — E890 Postprocedural hypothyroidism: Secondary | ICD-10-CM | POA: Diagnosis not present

## 2017-01-22 DIAGNOSIS — R9431 Abnormal electrocardiogram [ECG] [EKG]: Secondary | ICD-10-CM | POA: Diagnosis not present

## 2017-01-23 ENCOUNTER — Other Ambulatory Visit: Payer: Self-pay

## 2017-01-23 ENCOUNTER — Other Ambulatory Visit: Payer: Self-pay | Admitting: Family Medicine

## 2017-01-23 ENCOUNTER — Other Ambulatory Visit: Payer: Self-pay | Admitting: Obstetrics and Gynecology

## 2017-01-23 DIAGNOSIS — Z1231 Encounter for screening mammogram for malignant neoplasm of breast: Secondary | ICD-10-CM

## 2017-01-23 DIAGNOSIS — Z853 Personal history of malignant neoplasm of breast: Secondary | ICD-10-CM

## 2017-01-25 ENCOUNTER — Ambulatory Visit (INDEPENDENT_AMBULATORY_CARE_PROVIDER_SITE_OTHER)
Admission: RE | Admit: 2017-01-25 | Discharge: 2017-01-25 | Disposition: A | Discharge status: Home / Self Care | Payer: Medicare HMO | Source: Ambulatory Visit | Attending: Pulmonary Disease | Admitting: Pulmonary Disease

## 2017-01-25 DIAGNOSIS — J438 Other emphysema: Secondary | ICD-10-CM

## 2017-01-25 DIAGNOSIS — Z720 Tobacco use: Secondary | ICD-10-CM

## 2017-01-25 DIAGNOSIS — R911 Solitary pulmonary nodule: Secondary | ICD-10-CM | POA: Diagnosis not present

## 2017-01-29 ENCOUNTER — Telehealth: Payer: Self-pay | Admitting: Pulmonary Disease

## 2017-01-29 NOTE — Telephone Encounter (Signed)
CT chest 01/25/17 >> atherosclerosis, apical scarring, centrilobular emphysema, no change 5 mm LLL nodule   Will have nurse inform pt that CT chest is stable.  Will discuss in more detail at next ROV.

## 2017-01-31 NOTE — Telephone Encounter (Signed)
Results have been explained to patient, pt expressed understanding. Nothing further needed.  

## 2017-02-02 DIAGNOSIS — W57XXXA Bitten or stung by nonvenomous insect and other nonvenomous arthropods, initial encounter: Secondary | ICD-10-CM | POA: Diagnosis not present

## 2017-02-02 DIAGNOSIS — S70361A Insect bite (nonvenomous), right thigh, initial encounter: Secondary | ICD-10-CM | POA: Diagnosis not present

## 2017-02-04 ENCOUNTER — Ambulatory Visit: Payer: Medicare HMO

## 2017-02-04 DIAGNOSIS — H524 Presbyopia: Secondary | ICD-10-CM | POA: Diagnosis not present

## 2017-02-04 DIAGNOSIS — H5203 Hypermetropia, bilateral: Secondary | ICD-10-CM | POA: Diagnosis not present

## 2017-02-04 DIAGNOSIS — H40033 Anatomical narrow angle, bilateral: Secondary | ICD-10-CM | POA: Diagnosis not present

## 2017-02-04 DIAGNOSIS — H2513 Age-related nuclear cataract, bilateral: Secondary | ICD-10-CM | POA: Diagnosis not present

## 2017-02-11 ENCOUNTER — Ambulatory Visit
Admission: RE | Admit: 2017-02-11 | Discharge: 2017-02-11 | Disposition: A | Discharge status: Home / Self Care | Payer: Medicare HMO | Source: Ambulatory Visit | Attending: Family Medicine | Admitting: Family Medicine

## 2017-02-11 DIAGNOSIS — Z1231 Encounter for screening mammogram for malignant neoplasm of breast: Secondary | ICD-10-CM

## 2017-02-11 DIAGNOSIS — Z853 Personal history of malignant neoplasm of breast: Secondary | ICD-10-CM

## 2017-02-11 HISTORY — DX: Personal history of irradiation: Z92.3

## 2017-02-23 DIAGNOSIS — W57XXXA Bitten or stung by nonvenomous insect and other nonvenomous arthropods, initial encounter: Secondary | ICD-10-CM | POA: Diagnosis not present

## 2017-02-23 DIAGNOSIS — S70362D Insect bite (nonvenomous), left thigh, subsequent encounter: Secondary | ICD-10-CM | POA: Diagnosis not present

## 2017-03-14 DIAGNOSIS — M79674 Pain in right toe(s): Secondary | ICD-10-CM | POA: Diagnosis not present

## 2017-03-14 DIAGNOSIS — S99929A Unspecified injury of unspecified foot, initial encounter: Secondary | ICD-10-CM | POA: Diagnosis not present

## 2017-03-14 DIAGNOSIS — S91111A Laceration without foreign body of right great toe without damage to nail, initial encounter: Secondary | ICD-10-CM | POA: Diagnosis not present

## 2017-03-14 DIAGNOSIS — M79675 Pain in left toe(s): Secondary | ICD-10-CM | POA: Diagnosis not present

## 2017-03-14 DIAGNOSIS — Z23 Encounter for immunization: Secondary | ICD-10-CM | POA: Diagnosis not present

## 2017-03-18 ENCOUNTER — Telehealth: Payer: Self-pay | Admitting: Endocrinology

## 2017-03-18 ENCOUNTER — Other Ambulatory Visit: Payer: Self-pay | Admitting: Endocrinology

## 2017-03-18 NOTE — Telephone Encounter (Signed)
I contacted the patient and advised the last tetanus shot was in 2004 that we had on file. She voiced understanding and had no further questions at this time.

## 2017-03-18 NOTE — Telephone Encounter (Signed)
Routing to you °

## 2017-03-18 NOTE — Telephone Encounter (Signed)
Patient needs to know when her last tetanus shot was. Patient is not sure if Dr. Dwyane Dee was the one to do this or not. Please call patient to advise. Okay to leave a detailed message on line.

## 2017-04-02 DIAGNOSIS — I10 Essential (primary) hypertension: Secondary | ICD-10-CM | POA: Diagnosis not present

## 2017-04-02 DIAGNOSIS — E890 Postprocedural hypothyroidism: Secondary | ICD-10-CM | POA: Diagnosis not present

## 2017-04-02 DIAGNOSIS — M79674 Pain in right toe(s): Secondary | ICD-10-CM | POA: Diagnosis not present

## 2017-04-02 DIAGNOSIS — Z0000 Encounter for general adult medical examination without abnormal findings: Secondary | ICD-10-CM | POA: Diagnosis not present

## 2017-04-02 DIAGNOSIS — M79675 Pain in left toe(s): Secondary | ICD-10-CM | POA: Diagnosis not present

## 2017-04-05 ENCOUNTER — Encounter: Payer: Self-pay | Admitting: Pulmonary Disease

## 2017-04-05 ENCOUNTER — Ambulatory Visit (INDEPENDENT_AMBULATORY_CARE_PROVIDER_SITE_OTHER): Payer: Medicare HMO | Admitting: Pulmonary Disease

## 2017-04-05 VITALS — BP 114/72 | HR 100 | Ht 67.5 in | Wt 118.2 lb

## 2017-04-05 DIAGNOSIS — J438 Other emphysema: Secondary | ICD-10-CM | POA: Diagnosis not present

## 2017-04-05 DIAGNOSIS — R911 Solitary pulmonary nodule: Secondary | ICD-10-CM

## 2017-04-05 DIAGNOSIS — Z720 Tobacco use: Secondary | ICD-10-CM

## 2017-04-05 DIAGNOSIS — M792 Neuralgia and neuritis, unspecified: Secondary | ICD-10-CM | POA: Diagnosis not present

## 2017-04-05 NOTE — Patient Instructions (Signed)
Follow up as needed

## 2017-04-05 NOTE — Progress Notes (Signed)
Current Outpatient Prescriptions on File Prior to Visit  Medication Sig  . acyclovir ointment (ZOVIRAX) 5 % Apply 1 application topically 2 (two) times daily. Apply to affected area twice a day  . amlodipine-benazepril (LOTREL) 2.5-10 MG capsule TAKE ONE CAPSULE BY MOUTH EVERY DAY  . buPROPion (WELLBUTRIN SR) 100 MG 12 hr tablet TAKE 1 TABLET (100 MG TOTAL) BY MOUTH DAILY.  . Calcium Carbonate-Vitamin D (CALCIUM + D PO) Take 1 tablet by mouth daily.   . cetirizine (ZYRTEC) 10 MG tablet Take 10 mg by mouth daily as needed for allergies.  . fish oil-omega-3 fatty acids 1000 MG capsule Take 1 g by mouth daily.   . fluticasone (FLONASE) 50 MCG/ACT nasal spray Place 2 sprays into both nostrils daily.  Marland Kitchen levothyroxine (SYNTHROID, LEVOTHROID) 88 MCG tablet TAKE 1 TABLET (88 MCG TOTAL) BY MOUTH DAILY.  . meloxicam (MOBIC) 7.5 MG tablet TAKE 1 TABLET (7.5 MG TOTAL) BY MOUTH DAILY.  . Multiple Vitamin (MULTIVITAMIN) tablet Take 1 tablet by mouth daily.  . raloxifene (EVISTA) 60 MG tablet Take 1 tablet (60 mg total) by mouth daily.  Marland Kitchen VITAMIN E PO Take 1 tablet by mouth daily.   No current facility-administered medications on file prior to visit.     Chief Complaint  Patient presents with  . Follow-up    Pt is here for a 69-month f/u.  Pt had a CT scan 01/25/17. Denies any SOB, cough, or CP.    Pulmonary tests PFT 01/05/14 >> FEV1 1.80 (64%), FEV1% 74, TLC 4.44 (78%), DLCO 65% CT chest 01/07/14 >> biapical scarring, 6 mm nodule LLL, 5 mm nodule LLL CT chest 07/14/14 >> biapical scarring, 6 mm nodule LLL, 5 mm nodule LLL CT chest 02/16/15 >> mild centrilobular and paraseptal emphysema, no change to slight improvement in nodules CT chest 01/25/17 >> atherosclerosis, apical scarring, centrilobular emphysema, no change 5 mm LLL nodule  Past medical history GERD, Breast cancer 1997, Osteoporosis, HTN, Graves disease, Migraines  Past surgical hx, Medications, Allergies, Family hx, Social hx all  reviewed.  Vital signs BP 114/72 (BP Location: Right Arm, Patient Position: Sitting, Cuff Size: Normal)   Pulse 100   Ht 5' 7.5" (1.715 m)   Wt 118 lb 3.2 oz (53.6 kg)   SpO2 100%   BMI 18.24 kg/m    History of Present Illness: Victoria Blair is a 71 y.o. female smoker with lung nodule.  She has CT chest in June.  No change in LLL nodule over past several years.  She started smoking again.  Lots of stress at home.  Things are getting better, and she plans to quit again.  No cough, wheeze, sputum, fever, hemoptysis, chest pain, swelling.   Physical Exam:  General - pleasant Eyes - pupils reactive ENT - no sinus tenderness, no oral exudate, no LAN Cardiac - regular, no murmur Chest - no wheeze, rales Abd - soft, non tender Ext - no edema Skin - no rashes Neuro - normal strength Psych - normal mood    Assessment/Plan:  Pulmonary nodules. - stable since 2015 - no additional radiographic follow up needed for these  Tobacco abuse. - encouraged her to follow through with plans to stop smoking  Emphysema. - no symptoms - monitor clinically  Patient Instructions  Follow up as needed    Chesley Mires, MD [facility] [facility] Pager:  [phone] 04/05/2017, 3:54 PM

## 2017-04-07 DIAGNOSIS — B029 Zoster without complications: Secondary | ICD-10-CM | POA: Diagnosis not present

## 2017-04-07 DIAGNOSIS — M542 Cervicalgia: Secondary | ICD-10-CM | POA: Diagnosis not present

## 2017-04-11 DIAGNOSIS — B029 Zoster without complications: Secondary | ICD-10-CM | POA: Diagnosis not present

## 2017-04-12 DIAGNOSIS — H2513 Age-related nuclear cataract, bilateral: Secondary | ICD-10-CM | POA: Diagnosis not present

## 2017-04-23 DIAGNOSIS — B0229 Other postherpetic nervous system involvement: Secondary | ICD-10-CM | POA: Diagnosis not present

## 2017-04-24 DIAGNOSIS — R55 Syncope and collapse: Secondary | ICD-10-CM | POA: Diagnosis not present

## 2017-04-24 DIAGNOSIS — S0083XA Contusion of other part of head, initial encounter: Secondary | ICD-10-CM | POA: Diagnosis not present

## 2017-04-29 ENCOUNTER — Other Ambulatory Visit: Payer: Self-pay | Admitting: Endocrinology

## 2017-05-03 DIAGNOSIS — N3001 Acute cystitis with hematuria: Secondary | ICD-10-CM | POA: Diagnosis not present

## 2017-05-08 DIAGNOSIS — L309 Dermatitis, unspecified: Secondary | ICD-10-CM | POA: Diagnosis not present

## 2017-05-16 DIAGNOSIS — R202 Paresthesia of skin: Secondary | ICD-10-CM | POA: Diagnosis not present

## 2017-05-21 DIAGNOSIS — H35373 Puckering of macula, bilateral: Secondary | ICD-10-CM | POA: Diagnosis not present

## 2017-05-21 DIAGNOSIS — D3132 Benign neoplasm of left choroid: Secondary | ICD-10-CM | POA: Diagnosis not present

## 2017-05-21 DIAGNOSIS — H43813 Vitreous degeneration, bilateral: Secondary | ICD-10-CM | POA: Diagnosis not present

## 2017-05-22 ENCOUNTER — Ambulatory Visit: Payer: Medicare HMO | Admitting: Endocrinology

## 2017-05-28 ENCOUNTER — Other Ambulatory Visit: Payer: Self-pay | Admitting: Endocrinology

## 2017-05-28 ENCOUNTER — Other Ambulatory Visit: Payer: Medicare HMO

## 2017-05-28 DIAGNOSIS — I10 Essential (primary) hypertension: Secondary | ICD-10-CM

## 2017-05-28 DIAGNOSIS — E7800 Pure hypercholesterolemia, unspecified: Secondary | ICD-10-CM

## 2017-05-28 DIAGNOSIS — E890 Postprocedural hypothyroidism: Secondary | ICD-10-CM

## 2017-05-28 DIAGNOSIS — E559 Vitamin D deficiency, unspecified: Secondary | ICD-10-CM

## 2017-05-29 ENCOUNTER — Other Ambulatory Visit (INDEPENDENT_AMBULATORY_CARE_PROVIDER_SITE_OTHER): Payer: Medicare HMO

## 2017-05-29 DIAGNOSIS — E7800 Pure hypercholesterolemia, unspecified: Secondary | ICD-10-CM | POA: Diagnosis not present

## 2017-05-29 DIAGNOSIS — I10 Essential (primary) hypertension: Secondary | ICD-10-CM

## 2017-05-29 DIAGNOSIS — E890 Postprocedural hypothyroidism: Secondary | ICD-10-CM

## 2017-05-29 LAB — LIPID PANEL
CHOLESTEROL: 187 mg/dL (ref 0–200)
HDL: 49.8 mg/dL (ref >39.00)
LDL CALC: 121 mg/dL — AB (ref 0–99)
NonHDL: 137.16
TRIGLYCERIDES: 83 mg/dL (ref 0.0–149.0)
Total CHOL/HDL Ratio: 4
VLDL: 16.6 mg/dL (ref 0.0–40.0)

## 2017-05-29 LAB — T4, FREE: FREE T4: 1.32 ng/dL (ref 0.60–1.60)

## 2017-05-29 LAB — COMPREHENSIVE METABOLIC PANEL
ALBUMIN: 4.1 g/dL (ref 3.5–5.2)
ALK PHOS: 54 U/L (ref 39–117)
ALT: 14 U/L (ref 0–35)
AST: 18 U/L (ref 0–37)
BUN: 19 mg/dL (ref 6–23)
CALCIUM: 9.6 mg/dL (ref 8.4–10.5)
CO2: 30 mEq/L (ref 19–32)
CREATININE: 0.78 mg/dL (ref 0.40–1.20)
Chloride: 105 mEq/L (ref 96–112)
GFR: 77.77 mL/min (ref >60.00)
Glucose, Bld: 93 mg/dL (ref 70–99)
POTASSIUM: 4.4 meq/L (ref 3.5–5.1)
SODIUM: 141 meq/L (ref 135–145)
TOTAL PROTEIN: 7 g/dL (ref 6.0–8.3)
Total Bilirubin: 0.7 mg/dL (ref 0.2–1.2)

## 2017-05-29 LAB — TSH: TSH: 0.73 u[IU]/mL (ref 0.35–4.50)

## 2017-05-30 DIAGNOSIS — S0990XD Unspecified injury of head, subsequent encounter: Secondary | ICD-10-CM | POA: Diagnosis not present

## 2017-05-30 NOTE — Progress Notes (Signed)
Patient ID: Victoria Blair, female   DOB: 11/23/1945, 71 y.o.   MRN: [id number]    Reason for Appointment:  Follow-up visit   History of Present Illness:   HYPOTHYROIDISM: This was first diagnosed in 1978 after I-131 treatment for Graves' disease  The treatment that the patient has taken include generic Synthroid.          In 1/15 because of low TSH the dose was reduced to 88 mcg  In 3/16 her TSH was mildly increased even though she was asymptomatic  She is  taking 88 g once daily and her dose has not been changed in some time  She does not complain of any unusual fatigue TSH is again normal  Compliance with the medical regimen has been as prescribed with taking the tablet in the morning before breakfast.  Lab Results  Component Value Date   TSH 0.73 05/29/2017   TSH 0.36 11/26/2016   TSH 1.62 05/24/2016   FREET4 1.32 05/29/2017   FREET4 1.31 11/26/2016   FREET4 0.95 05/24/2016   '  HYPERTENSION: She has been on treatment since 2009 and has been on Lotrel since then with good control.   Blood pressure is Consistently normal    Does not monitor at home       Allergies as of 05/31/2017      Reactions   Hydrocodone-acetaminophen    Patient passed out with this medication   Erythromycin Base Other (See Comments)   Other      Medication List       Accurate as of 05/31/17  3:21 PM. Always use your most recent med list.          amlodipine-benazepril 2.5-10 MG capsule Commonly known as:  LOTREL TAKE ONE CAPSULE BY MOUTH EVERY DAY   buPROPion 100 MG 12 hr tablet Commonly known as:  WELLBUTRIN SR TAKE 1 TABLET (100 MG TOTAL) BY MOUTH DAILY.   CALCIUM + D PO Take 1 tablet by mouth daily.   cetirizine 10 MG tablet Commonly known as:  ZYRTEC Take 10 mg by mouth daily as needed for allergies.   fish oil-omega-3 fatty acids 1000 MG capsule Take 1 g by mouth daily.   fluticasone 50 MCG/ACT nasal spray Commonly known as:  FLONASE Place 2  sprays into both nostrils daily.   gabapentin 100 MG capsule Commonly known as:  NEURONTIN Take 100 mg by mouth as needed.   levothyroxine 88 MCG tablet Commonly known as:  SYNTHROID, LEVOTHROID TAKE 1 TABLET (88 MCG TOTAL) BY MOUTH DAILY.   meloxicam 7.5 MG tablet Commonly known as:  MOBIC TAKE 1 TABLET (7.5 MG TOTAL) BY MOUTH DAILY.   multivitamin tablet Take 1 tablet by mouth daily.   valACYclovir 500 MG tablet Commonly known as:  VALTREX Take 500 mg by mouth as needed.   VITAMIN E PO Take 1 tablet by mouth daily.       Past Medical History:  Diagnosis Date  . Breast cancer ([place]) 1997  . Depression   . GERD (gastroesophageal reflux disease)   . Graves disease   . Hemorrhoids   . Hypertension   . Migraines   . Osteoporosis   . Personal history of radiation therapy 1998  . Vitamin D deficiency     Past Surgical History:  Procedure Laterality Date  . BREAST BIOPSY Right 08/11/1996  . BREAST LUMPECTOMY Right 1998  . CESAREAN SECTION    . CHOLECYSTECTOMY    . SPINE SURGERY  Right breast cancer treated with lumpectomy and radiation 2000, lumbar spine surgery     Family History  Problem Relation Age of Onset  . Heart disease Father   . Pulmonary fibrosis Father   . Hypertension Father   . Pancreatic cancer Sister   . Sleep apnea Son   . Diabetes Maternal Aunt     Social History:  reports that she has been smoking Cigarettes.  She has a 12.00 pack-year smoking history. She has never used smokeless tobacco. She reports that she drinks alcohol. She reports that she does not use drugs.  Allergies:  Allergies  Allergen Reactions  . Hydrocodone-Acetaminophen     Patient passed out with this medication  . Erythromycin Base Other (See Comments)    Other    REVIEW of systems:  She has lost weight this year   Wt Readings from Last 3 Encounters:  05/31/17 117 lb 12.8 oz (53.4 kg)  04/05/17 118 lb 3.2 oz (53.6 kg)  11/29/16 129 lb (58.5 kg)      Hyperlipidemia: She has mild increase in LDL, does not have any other risk factors to need statin treatment  She usually has a low-fat diet    Lab Results  Component Value Date   CHOL 187 05/29/2017   HDL 49.80 05/29/2017   LDLCALC 121 (H) 05/29/2017   TRIG 83.0 05/29/2017   CHOLHDL 4 05/29/2017     She has OSTEOPENIA; previously was given Actonel but stopped this because of difficulty with compliance.  T score -2.5  done by gynecologist in 2016  Her bone density was the same at the hip, previously T score was - 2.6 in 2013, previously was -1.7 at spine and in 2013, T score at the hip was -2.3, previously was - 1.1 at left hip. In the past gynecologist was suggesting Fosamax but she did not want to do this because of fear of side effects.   She has been taking Evista since 5/09, and is not regular on this  She was reminded to start back on her last visit in April and she probably took it only for a month and then stopped it again She says she does not like to take it when she has other medical problems going on  In her youth she was 5 feet 7-1/2 inches tall. No history of fractures.  Has also been advised to take OTC vitamin D3    Lab Results  Component Value Date   VD25OH 31.22 11/27/2016   VD25OH 23.18 (L) 02/01/2015   VD25OH 49 01/01/2014          Examination:   BP 104/66   Pulse 80   Ht 5' 7.5" (1.715 m)   Wt 117 lb 12.8 oz (53.4 kg)   SpO2 96%   BMI 18.18 kg/m       Assessments / PLAN:  OSTEOPOROSIS: Asymptomatic with last T score of -2.5 She is not taking Evista again She needs another bone density and consider changing her medication especially since she does not take this regularly May be a candidate for Reclast   HYPOTHYROIDISM, post ablative, long-standing She is doing well subjectively  TSH is consistently normal with 88 g and she can continue the same dose  HYPERTENSION: The blood pressure is well-controlled and she can continue the  current dose of Lotrel    Naveed Humphres 05/31/2017, 3:21 PM

## 2017-05-31 ENCOUNTER — Ambulatory Visit (INDEPENDENT_AMBULATORY_CARE_PROVIDER_SITE_OTHER): Payer: Medicare HMO | Admitting: Endocrinology

## 2017-05-31 ENCOUNTER — Telehealth: Payer: Self-pay

## 2017-05-31 ENCOUNTER — Encounter: Payer: Self-pay | Admitting: Endocrinology

## 2017-05-31 ENCOUNTER — Ambulatory Visit: Payer: Medicare HMO | Admitting: Endocrinology

## 2017-05-31 VITALS — BP 104/66 | HR 80 | Ht 67.5 in | Wt 117.8 lb

## 2017-05-31 DIAGNOSIS — M810 Age-related osteoporosis without current pathological fracture: Secondary | ICD-10-CM

## 2017-05-31 DIAGNOSIS — E890 Postprocedural hypothyroidism: Secondary | ICD-10-CM | POA: Diagnosis not present

## 2017-05-31 DIAGNOSIS — E559 Vitamin D deficiency, unspecified: Secondary | ICD-10-CM

## 2017-05-31 MED ORDER — RALOXIFENE HCL 60 MG PO TABS: 60.0000 mg | ORAL_TABLET | Freq: Every day | ORAL | 5 refills | Status: DC

## 2017-05-31 NOTE — Patient Instructions (Signed)
Restart Evista and take all the time

## 2017-05-31 NOTE — Telephone Encounter (Signed)
Called [facility] to schedule a bone density and the date is on Wednesday, October 10th 2018, at 2 pm and will be an [address]. Patient was standing with me when I made this appointment and she agreed that this time would work.

## 2017-06-04 ENCOUNTER — Ambulatory Visit (INDEPENDENT_AMBULATORY_CARE_PROVIDER_SITE_OTHER)
Admission: RE | Admit: 2017-06-04 | Discharge: 2017-06-04 | Disposition: A | Discharge status: Home / Self Care | Payer: Medicare HMO | Source: Ambulatory Visit | Attending: Endocrinology | Admitting: Endocrinology

## 2017-06-04 DIAGNOSIS — M810 Age-related osteoporosis without current pathological fracture: Secondary | ICD-10-CM

## 2017-06-05 ENCOUNTER — Other Ambulatory Visit: Payer: Medicare HMO

## 2017-06-05 DIAGNOSIS — S0990XA Unspecified injury of head, initial encounter: Secondary | ICD-10-CM | POA: Diagnosis not present

## 2017-06-05 DIAGNOSIS — I6781 Acute cerebrovascular insufficiency: Secondary | ICD-10-CM | POA: Diagnosis not present

## 2017-06-05 DIAGNOSIS — R200 Anesthesia of skin: Secondary | ICD-10-CM | POA: Diagnosis not present

## 2017-06-17 DIAGNOSIS — S0240DA Maxillary fracture, left side, initial encounter for closed fracture: Secondary | ICD-10-CM | POA: Diagnosis not present

## 2017-06-17 DIAGNOSIS — H6042 Cholesteatoma of left external ear: Secondary | ICD-10-CM | POA: Diagnosis not present

## 2017-06-21 DIAGNOSIS — Z23 Encounter for immunization: Secondary | ICD-10-CM | POA: Diagnosis not present

## 2017-07-03 ENCOUNTER — Telehealth: Payer: Self-pay | Admitting: Nutrition

## 2017-07-03 NOTE — Telephone Encounter (Signed)
Phoned patient and asked if she wants to schedule her Reclast infusion.  She reports that she has called her [employer] and they could not tell her if it is covered.  She was told to call back again to [facility] and speak with the supervisor to see if it will be covered in the physician office.  If not, will it be covered as out patent in the hospital.  She was given my number to call me back once she gets that information.

## 2017-07-09 ENCOUNTER — Telehealth: Payer: Self-pay | Admitting: Endocrinology

## 2017-07-09 NOTE — Telephone Encounter (Signed)
Patient wants her bone density results mailed to her at [facility], [place], [place] [address]

## 2017-07-10 NOTE — Telephone Encounter (Signed)
I have printed this letter and results and will mail.

## 2017-07-11 NOTE — Telephone Encounter (Signed)
error 

## 2017-07-13 DIAGNOSIS — M545 Low back pain: Secondary | ICD-10-CM | POA: Diagnosis not present

## 2017-07-15 ENCOUNTER — Telehealth: Payer: Self-pay | Admitting: Endocrinology

## 2017-07-15 NOTE — Telephone Encounter (Signed)
Patient needs authorization for infusion. Patient also wants to know whether it is more affordable to do infusion in hospital, with Joycelyn Schmid upstairs or here. Please call patient at ph# [phone] and advise.

## 2017-07-22 ENCOUNTER — Telehealth: Payer: Self-pay | Admitting: Endocrinology

## 2017-07-22 NOTE — Telephone Encounter (Signed)
Patient is calling on the status of reclast infusion they need a PA   P# [phone]

## 2017-07-23 NOTE — Telephone Encounter (Signed)
Can you complete this one? Not sure if you already have started? Thank you!

## 2017-07-24 NOTE — Telephone Encounter (Signed)
Can you please start this

## 2017-08-07 NOTE — Telephone Encounter (Signed)
Called the patient this morning.  She is not able to contact her insurance for verification that it will pay for in office reclast, or any other reclast.  I told her that she could have it done at the hospital, and they would lit her know what it will cost her before attending.  She would prefer this, because she is afraid of the cost.

## 2017-08-07 NOTE — Telephone Encounter (Signed)
Form has been signed.Marland Kitchen  Hospital will likely have her come back for BMP for renal function

## 2017-08-21 ENCOUNTER — Telehealth: Payer: Self-pay | Admitting: Endocrinology

## 2017-08-21 NOTE — Telephone Encounter (Signed)
Pt is following up on the reclast infusion approval and scheduling

## 2017-08-28 ENCOUNTER — Telehealth: Payer: Self-pay | Admitting: Nutrition

## 2017-08-28 DIAGNOSIS — H6042 Cholesteatoma of left external ear: Secondary | ICD-10-CM | POA: Diagnosis not present

## 2017-08-28 NOTE — Telephone Encounter (Signed)
Pt. Has decided to have this done at the hospital.  They can do the prior auth. For her.  Form faxed to short stay.

## 2017-08-28 NOTE — Telephone Encounter (Signed)
Has this been approved Vaughan Basta? Please advise

## 2017-08-28 NOTE — Telephone Encounter (Signed)
Pt. Was notified that she needs to have an updated BMP.  Will call back with date/time she can come in.

## 2017-08-28 NOTE — Telephone Encounter (Signed)
Victoria Blair, please draw only CMP from her pending labs.  Need to be scheduled for labs

## 2017-09-04 ENCOUNTER — Other Ambulatory Visit (INDEPENDENT_AMBULATORY_CARE_PROVIDER_SITE_OTHER): Payer: Medicare HMO

## 2017-09-04 DIAGNOSIS — M810 Age-related osteoporosis without current pathological fracture: Secondary | ICD-10-CM

## 2017-09-04 LAB — COMPREHENSIVE METABOLIC PANEL
ALBUMIN: 4.3 g/dL (ref 3.5–5.2)
ALK PHOS: 49 U/L (ref 39–117)
ALT: 18 U/L (ref 0–35)
AST: 19 U/L (ref 0–37)
BUN: 19 mg/dL (ref 6–23)
CO2: 32 mEq/L (ref 19–32)
CREATININE: 0.67 mg/dL (ref 0.40–1.20)
Calcium: 9.4 mg/dL (ref 8.4–10.5)
Chloride: 103 mEq/L (ref 96–112)
GFR: 92.61 mL/min (ref >60.00)
Glucose, Bld: 101 mg/dL — ABNORMAL HIGH (ref 70–99)
POTASSIUM: 4.1 meq/L (ref 3.5–5.1)
SODIUM: 141 meq/L (ref 135–145)
TOTAL PROTEIN: 6.8 g/dL (ref 6.0–8.3)
Total Bilirubin: 0.5 mg/dL (ref 0.2–1.2)

## 2017-09-11 NOTE — Telephone Encounter (Signed)
Opened in error

## 2017-09-12 DIAGNOSIS — M79641 Pain in right hand: Secondary | ICD-10-CM | POA: Diagnosis not present

## 2017-09-12 DIAGNOSIS — S6991XA Unspecified injury of right wrist, hand and finger(s), initial encounter: Secondary | ICD-10-CM | POA: Diagnosis not present

## 2017-09-18 ENCOUNTER — Telehealth: Payer: Self-pay | Admitting: Endocrinology

## 2017-09-18 ENCOUNTER — Telehealth: Payer: Self-pay | Admitting: Nutrition

## 2017-09-18 NOTE — Telephone Encounter (Signed)
Victoria Blair with [employer] ph# [phone] called-they need procedure code for Reclast

## 2017-09-18 NOTE — Telephone Encounter (Deleted)
[employer] called and is needing a dx code for the reclast.

## 2017-09-18 NOTE — Telephone Encounter (Signed)
Please check with Vaughan Basta

## 2017-09-23 NOTE — Telephone Encounter (Signed)
They had no idea who Helmut Muster is, but I gave the Procedure code for recalst given to [employer] representative: [phone]), who then transferred me to pharmacy for patients, who then transferred me to pharmacy for MD office

## 2017-09-23 NOTE — Telephone Encounter (Signed)
They told me that it does not need a prior auth.  Pt. Was called and given the information

## 2017-09-24 NOTE — Telephone Encounter (Signed)
Opened in error

## 2017-09-25 DIAGNOSIS — R69 Illness, unspecified: Secondary | ICD-10-CM | POA: Diagnosis not present

## 2017-10-16 ENCOUNTER — Telehealth: Payer: Self-pay | Admitting: Nutrition

## 2017-10-16 NOTE — Telephone Encounter (Signed)
Returning call from message left on my machine wanting to know if blood work was still valid if she had Reclast infusion next week.  I phoned her and told her yes--she had it done on 09/04/17.  But, she told me that she can not come in due to someone being out at work.  She will call me back when she can schedule this.

## 2017-10-24 ENCOUNTER — Other Ambulatory Visit: Payer: Self-pay | Admitting: Endocrinology

## 2017-11-01 ENCOUNTER — Other Ambulatory Visit: Payer: Self-pay | Admitting: Endocrinology

## 2017-11-01 ENCOUNTER — Telehealth: Payer: Self-pay | Admitting: Family Medicine

## 2017-11-01 ENCOUNTER — Other Ambulatory Visit: Payer: Self-pay

## 2017-11-01 MED ORDER — LEVOTHYROXINE SODIUM 88 MCG PO TABS: ORAL_TABLET | ORAL | 1 refills | Status: DC

## 2017-11-01 NOTE — Telephone Encounter (Signed)
This has been done.

## 2017-11-01 NOTE — Telephone Encounter (Signed)
Pt needs refill on

## 2017-11-01 NOTE — Telephone Encounter (Signed)
pt needs refill on synthroid  She would like 90 day refill [employer] number [phone]

## 2017-11-03 DIAGNOSIS — K120 Recurrent oral aphthae: Secondary | ICD-10-CM | POA: Diagnosis not present

## 2017-11-05 ENCOUNTER — Telehealth: Payer: Self-pay | Admitting: Nutrition

## 2017-11-05 NOTE — Telephone Encounter (Signed)
Ms. Victoria Blair says she has never heard back from her insurance about what her copay would be.  She wanted it in writing, but they would not send her this.  She therefor does not want to go ahead with the reclast for now.

## 2017-11-08 DIAGNOSIS — Z0001 Encounter for general adult medical examination with abnormal findings: Secondary | ICD-10-CM | POA: Diagnosis not present

## 2017-11-08 DIAGNOSIS — M549 Dorsalgia, unspecified: Secondary | ICD-10-CM | POA: Diagnosis not present

## 2017-11-08 DIAGNOSIS — R300 Dysuria: Secondary | ICD-10-CM | POA: Diagnosis not present

## 2017-11-08 DIAGNOSIS — Z124 Encounter for screening for malignant neoplasm of cervix: Secondary | ICD-10-CM | POA: Diagnosis not present

## 2017-11-08 DIAGNOSIS — R69 Illness, unspecified: Secondary | ICD-10-CM | POA: Diagnosis not present

## 2017-11-13 ENCOUNTER — Telehealth: Payer: Self-pay | Admitting: Nutrition

## 2017-11-13 NOTE — Telephone Encounter (Signed)
Patient reports that her insurance requires a prior [place]. For her reclast infusion.   I have never had to do one.  Please let me know who to call, or what to do to get the prior auth.

## 2017-11-22 ENCOUNTER — Other Ambulatory Visit (INDEPENDENT_AMBULATORY_CARE_PROVIDER_SITE_OTHER): Payer: Medicare HMO

## 2017-11-22 DIAGNOSIS — E559 Vitamin D deficiency, unspecified: Secondary | ICD-10-CM | POA: Diagnosis not present

## 2017-11-22 DIAGNOSIS — E890 Postprocedural hypothyroidism: Secondary | ICD-10-CM | POA: Diagnosis not present

## 2017-11-22 LAB — TSH: TSH: 0.2 u[IU]/mL — ABNORMAL LOW (ref 0.35–4.50)

## 2017-11-22 LAB — VITAMIN D 25 HYDROXY (VIT D DEFICIENCY, FRACTURES): VITD: 25.66 ng/mL — AB (ref 30.00–100.00)

## 2017-11-27 NOTE — Telephone Encounter (Signed)
Insurance was called.  No prior auth. Needed for reclast, but needed preapproval for Reclast.  Information faxed to insurance company with office notes and xray

## 2017-11-29 ENCOUNTER — Encounter: Payer: Self-pay | Admitting: Endocrinology

## 2017-11-29 ENCOUNTER — Ambulatory Visit: Payer: Medicare HMO | Admitting: Endocrinology

## 2017-11-29 ENCOUNTER — Other Ambulatory Visit: Payer: Medicare HMO

## 2017-11-29 VITALS — BP 95/64 | HR 75 | Ht 66.5 in | Wt 122.0 lb

## 2017-11-29 DIAGNOSIS — E559 Vitamin D deficiency, unspecified: Secondary | ICD-10-CM | POA: Diagnosis not present

## 2017-11-29 DIAGNOSIS — I10 Essential (primary) hypertension: Secondary | ICD-10-CM

## 2017-11-29 DIAGNOSIS — E063 Autoimmune thyroiditis: Secondary | ICD-10-CM

## 2017-11-29 DIAGNOSIS — M810 Age-related osteoporosis without current pathological fracture: Secondary | ICD-10-CM

## 2017-11-29 DIAGNOSIS — I952 Hypotension due to drugs: Secondary | ICD-10-CM

## 2017-11-29 MED ORDER — LEVOTHYROXINE SODIUM 75 MCG PO TABS: 75.0000 ug | ORAL_TABLET | Freq: Every day | ORAL | 3 refills | Status: DC

## 2017-11-29 MED ORDER — LISINOPRIL 5 MG PO TABS: 5.0000 mg | ORAL_TABLET | Freq: Every day | ORAL | 3 refills | Status: DC

## 2017-11-29 NOTE — Patient Instructions (Addendum)
Vitamin D 3, 2000 unit daily  Change BP med

## 2017-11-29 NOTE — Progress Notes (Signed)
Patient ID: Victoria Blair, female   DOB: 04/26/1946, 72 y.o.   MRN: [id number]    Reason for Appointment:  Follow-up visit for various problems   History of Present Illness:   HYPOTHYROIDISM: This was first diagnosed in 1978 after I-131 treatment for Graves' disease          In 1/15 because of low TSH the dose was reduced to 88 mcg   She is  taking 88 g generic levothyroxine once daily and her dose has not been changed in some time  She does not complain of any unusual fatigue, she says that she feels tired all the time because of doing too much She does take her levothyroxine consistently as before in the morning before breakfast  Although she has gained back some of the weight she had lost her TSH is now below normal She does is not complaining of any shakiness or palpitations  Lab Results  Component Value Date   TSH 0.20 (L) 11/22/2017   TSH 0.73 05/29/2017   TSH 0.36 11/26/2016   FREET4 1.32 05/29/2017   FREET4 1.31 11/26/2016   FREET4 0.95 05/24/2016   '  HYPERTENSION: She has been on treatment since 2009 and has been on Lotrel since then with good control.   Blood pressure is appearing to be low normal and was also only 110/60 with PCP last month  She said that when she bends down and gets up she may feel lightheaded but not otherwise  Does not monitor at home    BP Readings from Last 3 Encounters:  11/29/17 95/64  05/31/17 104/66  04/05/17 114/72      Allergies as of 11/29/2017      Reactions   Hydrocodone-acetaminophen    Patient passed out with this medication   Erythromycin Base Other (See Comments)   Other      Medication List        Accurate as of 11/29/17 11:51 AM. Always use your most recent med list.          amlodipine-benazepril 2.5-10 MG capsule Commonly known as:  LOTREL TAKE ONE CAPSULE BY MOUTH EVERY DAY   buPROPion 100 MG 12 hr tablet Commonly known as:  WELLBUTRIN SR TAKE 1 TABLET (100 MG TOTAL) BY MOUTH DAILY.   CALCIUM + D PO Take 1 tablet by mouth daily.   cetirizine 10 MG tablet Commonly known as:  ZYRTEC Take 10 mg by mouth daily as needed for allergies.   fish oil-omega-3 fatty acids 1000 MG capsule Take 1 g by mouth daily.   fluticasone 50 MCG/ACT nasal spray Commonly known as:  FLONASE Place 2 sprays into both nostrils daily.   gabapentin 100 MG capsule Commonly known as:  NEURONTIN Take 100 mg by mouth as needed.   levothyroxine 88 MCG tablet Commonly known as:  SYNTHROID, LEVOTHROID TAKE 1 TABLET (88 MCG TOTAL) BY MOUTH DAILY.   lisinopril 5 MG tablet Commonly known as:  PRINIVIL,ZESTRIL Take 1 tablet (5 mg total) by mouth daily.   meloxicam 7.5 MG tablet Commonly known as:  MOBIC TAKE 1 TABLET (7.5 MG TOTAL) BY MOUTH DAILY.   multivitamin tablet Take 1 tablet by mouth daily.   raloxifene 60 MG tablet Commonly known as:  EVISTA Take 1 tablet (60 mg total) by mouth daily.   valACYclovir 500 MG tablet Commonly known as:  VALTREX Take 500 mg by mouth as needed.   VITAMIN E PO Take 1 tablet by mouth daily.  Past Medical History:  Diagnosis Date  . Breast cancer ([place]) 1997  . Depression   . GERD (gastroesophageal reflux disease)   . Graves disease   . Hemorrhoids   . Hypertension   . Migraines   . Osteoporosis   . Personal history of radiation therapy 1998  . Vitamin D deficiency     Past Surgical History:  Procedure Laterality Date  . BREAST BIOPSY Right 08/11/1996  . BREAST LUMPECTOMY Right 1998  . CESAREAN SECTION    . CHOLECYSTECTOMY    . SPINE SURGERY      Right breast cancer treated with lumpectomy and radiation 2000, lumbar spine surgery     Family History  Problem Relation Age of Onset  . Heart disease Father   . Pulmonary fibrosis Father   . Hypertension Father   . Pancreatic cancer Sister   . Sleep apnea Son   . Diabetes Maternal Aunt     Social History:  reports that she has been smoking cigarettes.  She has a 12.00  pack-year smoking history. She has never used smokeless tobacco. She reports that she drinks alcohol. She reports that she does not use drugs.  Allergies:  Allergies  Allergen Reactions  . Hydrocodone-Acetaminophen     Patient passed out with this medication  . Erythromycin Base Other (See Comments)    Other    REVIEW of systems:  She has regained the weight she had lost last year   Wt Readings from Last 3 Encounters:  11/29/17 122 lb (55.3 kg)  05/31/17 117 lb 12.8 oz (53.4 kg)  04/05/17 118 lb 3.2 oz (53.6 kg)     Hyperlipidemia: She has mild increase in LDL, does not have any other risk factors to need statin treatment  She usually has a low-fat diet    Lab Results  Component Value Date   CHOL 187 05/29/2017   HDL 49.80 05/29/2017   LDLCALC 121 (H) 05/29/2017   TRIG 83.0 05/29/2017   CHOLHDL 4 05/29/2017     She has OSTEOPOROSIS; previously was given Actonel but stopped this because of difficulty with compliance.  T score at spine was -2.8 in October 2018, previously was -2.5 done by gynecologist in 2016  In the past gynecologist was suggesting Fosamax but she did not want to do this because of fear of side effects.   She has been taking Evista since 5/09, and is not regular on this  She was reminded to start back on her last visit in April and she probably took it only for a month and then stopped it again She says she does not like to take it when she has other medical problems going on  In her youth she was 5 feet 7-1/2 inches tall she has lost 1 inch . No history of fractures.  Has also been advised to take OTC vitamin D3 but not doing this and her level is still low   Lab Results  Component Value Date   VD25OH 25.66 (L) 11/22/2017   VD25OH 31.22 11/27/2016   VD25OH 23.18 (L) 02/01/2015          Examination:   BP 95/64   Pulse 75   Ht 5' 6.5" (1.689 m)   Wt 122 lb (55.3 kg)   SpO2 98%   BMI 19.40 kg/m       Assessments /  PLAN:  OSTEOPOROSIS: Asymptomatic with last T score of -2.8 and worsening She is also losing height  She had refused to take  Reclast because of not being sure about the cost but is agreeable to doing this now and explained to her that this is safe and under the circumstances needs to be done to prevent further height loss and worsening of osteoporosis Explained to her how this works, benefits, frequency of administration and possible side effects Currently has no contraindications to doing this She will take Tylenol and drink plenty of fluid before she comes in for the infusion  She also needs to take vitamin D3 2000 units daily separately from her calcium Discussed that she will need to take this long-term   HYPOTHYROIDISM, post ablative, long-standing She is not complaining of any fatigue or heat intolerance but her TSH is now relatively low She will now go down to the 75 mcg daily especially since she has osteoporosis at the same time   HYPERTENSION: The blood pressure is unusually low and she will stop her LOTREL and start LISINOPRIL 5 mg daily  Follow-up in 6 weeks   Total visit time for evaluation and management of multiple problems and counseling =25 minutes  Elayne Snare 11/29/2017, 11:51 AM

## 2017-12-04 ENCOUNTER — Ambulatory Visit: Payer: Medicare HMO | Admitting: Endocrinology

## 2017-12-08 DIAGNOSIS — S70362A Insect bite (nonvenomous), left thigh, initial encounter: Secondary | ICD-10-CM | POA: Diagnosis not present

## 2017-12-08 DIAGNOSIS — W57XXXA Bitten or stung by nonvenomous insect and other nonvenomous arthropods, initial encounter: Secondary | ICD-10-CM | POA: Diagnosis not present

## 2017-12-09 ENCOUNTER — Telehealth: Payer: Self-pay | Admitting: Nutrition

## 2017-12-09 NOTE — Telephone Encounter (Signed)
Pt. Was informed that we got the autorization for Reclast.  She would like to call them to see what her cost will be.  She was given my number to call back to set up the appt.

## 2017-12-17 DIAGNOSIS — L219 Seborrheic dermatitis, unspecified: Secondary | ICD-10-CM | POA: Diagnosis not present

## 2017-12-23 ENCOUNTER — Encounter

## 2017-12-23 ENCOUNTER — Encounter: Payer: Self-pay | Admitting: Endocrinology

## 2017-12-23 ENCOUNTER — Ambulatory Visit: Payer: Medicare HMO | Admitting: Endocrinology

## 2017-12-23 VITALS — BP 132/74 | HR 87 | Ht 66.5 in | Wt 120.4 lb

## 2017-12-23 DIAGNOSIS — I10 Essential (primary) hypertension: Secondary | ICD-10-CM | POA: Diagnosis not present

## 2017-12-23 DIAGNOSIS — E063 Autoimmune thyroiditis: Secondary | ICD-10-CM

## 2017-12-23 NOTE — Patient Instructions (Addendum)
Schedule Reclast  Take 2000 U of D3

## 2017-12-23 NOTE — Progress Notes (Signed)
Patient ID: Victoria Blair, female   DOB: 04-21-1946, 72 y.o.   MRN: [id number]    Reason for Appointment:  Follow-up visit for various problems   History of Present Illness:   HYPOTHYROIDISM: This was first diagnosed in 1978 after I-131 treatment for Graves' disease          In 1/15 because of low TSH the dose was reduced to 88 mcg   She was taking 88 g generic levothyroxine once daily for quite some time  In 3/19 her TSH was relatively low She is now taking 75 mcg daily She thinks that she feels a little less anxious now and overall better with energy Her weight tends to fluctuate  She does take her levothyroxine consistently as before in the morning before breakfast    Lab Results  Component Value Date   TSH 0.20 (L) 11/22/2017   TSH 0.73 05/29/2017   TSH 0.36 11/26/2016   FREET4 1.32 05/29/2017   FREET4 1.31 11/26/2016   FREET4 0.95 05/24/2016   '  HYPERTENSION: She has been on treatment since 2009 and had previously been on Lotrel for quite some time with good control.   Blood pressure was low normal in 09/1306 with systolic readings low standing up She was not symptomatic except for her dizziness when bending down  With taking only lisinopril 5 mg daily she feels fairly good   Does not monitor at home    BP Readings from Last 3 Encounters:  12/23/17 132/74  11/29/17 95/64  05/31/17 104/66      Allergies as of 12/23/2017      Reactions   Hydrocodone-acetaminophen    Patient passed out with this medication   Erythromycin Base Other (See Comments)   Other      Medication List        Accurate as of 12/23/17  8:30 AM. Always use your most recent med list.          amlodipine-benazepril 2.5-10 MG capsule Commonly known as:  LOTREL TAKE ONE CAPSULE BY MOUTH EVERY DAY   buPROPion 100 MG 12 hr tablet Commonly known as:  WELLBUTRIN SR TAKE 1 TABLET (100 MG TOTAL) BY MOUTH DAILY.   CALCIUM + D PO Take 1 tablet by mouth daily.     cetirizine 10 MG tablet Commonly known as:  ZYRTEC Take 10 mg by mouth daily as needed for allergies.   fish oil-omega-3 fatty acids 1000 MG capsule Take 1 g by mouth daily.   fluticasone 50 MCG/ACT nasal spray Commonly known as:  FLONASE Place 2 sprays into both nostrils daily.   gabapentin 100 MG capsule Commonly known as:  NEURONTIN Take 100 mg by mouth as needed.   levothyroxine 75 MCG tablet Commonly known as:  SYNTHROID, LEVOTHROID Take 1 tablet (75 mcg total) by mouth daily.   lisinopril 5 MG tablet Commonly known as:  PRINIVIL,ZESTRIL Take 1 tablet (5 mg total) by mouth daily.   meloxicam 7.5 MG tablet Commonly known as:  MOBIC TAKE 1 TABLET (7.5 MG TOTAL) BY MOUTH DAILY.   multivitamin tablet Take 1 tablet by mouth daily.   raloxifene 60 MG tablet Commonly known as:  EVISTA Take 1 tablet (60 mg total) by mouth daily.   valACYclovir 500 MG tablet Commonly known as:  VALTREX Take 500 mg by mouth as needed.   VITAMIN E PO Take 1 tablet by mouth daily.       Past Medical History:  Diagnosis Date  .  Breast cancer ([place]) 1997  . Depression   . GERD (gastroesophageal reflux disease)   . Graves disease   . Hemorrhoids   . Hypertension   . Migraines   . Osteoporosis   . Personal history of radiation therapy 1998  . Vitamin D deficiency     Past Surgical History:  Procedure Laterality Date  . BREAST BIOPSY Right 08/11/1996  . BREAST LUMPECTOMY Right 1998  . CESAREAN SECTION    . CHOLECYSTECTOMY    . SPINE SURGERY      Right breast cancer treated with lumpectomy and radiation 2000, lumbar spine surgery     Family History  Problem Relation Age of Onset  . Heart disease Father   . Pulmonary fibrosis Father   . Hypertension Father   . Pancreatic cancer Sister   . Sleep apnea Son   . Diabetes Maternal Aunt     Social History:  reports that she has been smoking cigarettes.  She has a 12.00 pack-year smoking history. She has never used  smokeless tobacco. She reports that she drinks alcohol. She reports that she does not use drugs.  Allergies:  Allergies  Allergen Reactions  . Hydrocodone-Acetaminophen     Patient passed out with this medication  . Erythromycin Base Other (See Comments)    Other    REVIEW of systems:  She has variable weight recently   Wt Readings from Last 3 Encounters:  12/23/17 120 lb 6.4 oz (54.6 kg)  11/29/17 122 lb (55.3 kg)  05/31/17 117 lb 12.8 oz (53.4 kg)     Hyperlipidemia: She has a persistently mild increase in LDL, does not have any other risk factors to need statin treatment  She usually has a low-fat diet    Lab Results  Component Value Date   CHOL 187 05/29/2017   HDL 49.80 05/29/2017   LDLCALC 121 (H) 05/29/2017   TRIG 83.0 05/29/2017   CHOLHDL 4 05/29/2017     She has OSTEOPOROSIS; previously was given Actonel but stopped this because of difficulty with compliance.  T score at spine was -2.8 in October 2018, previously was -2.5 done by gynecologist in 2016 In her youth she was 5 feet 7-1/2 inches tall she has lost 1 inch No history of fractures.  In the past gynecologist was suggesting Fosamax but she did not want to do this because of fear of side effects.   She previously had been taking Evista since 5/09, and not regular on this  She also went off of it because of cost  She has been recommended RECLAST and although she is agreeable to doing this she still wants to find out how much it will cost her before scheduling   Has also been advised to take OTC vitamin D3 Since her level was still low in March she was advised to start back and she is taking a supplement but not sure how much     Lab Results  Component Value Date   VD25OH 25.66 (L) 11/22/2017   VD25OH 31.22 11/27/2016   VD25OH 23.18 (L) 02/01/2015          Examination:   BP 132/74 (BP Location: Left Arm, Patient Position: Sitting, Cuff Size: Normal)   Pulse 87   Ht 5' 6.5" (1.689 m)   Wt  120 lb 6.4 oz (54.6 kg)   SpO2 96%   BMI 19.14 kg/m       Assessments / PLAN:   HYPERTENSION: The blood pressure is not as low with  stopping Lotrel and starting lisinopril 5 mg daily   Vitamin D deficiency: She will make sure she is on 2000 units of vitamin D3   HYPOTHYROIDISM, post ablative, long-standing She feels slightly better with reducing her dose down to 75 instead of 88 for her low TSH level  Needs follow-up labs in about 3 weeks   Follow-up in 6 months for routine follow-up    Carroll Lingelbach 12/23/2017, 8:30 AM

## 2018-01-02 DIAGNOSIS — R300 Dysuria: Secondary | ICD-10-CM | POA: Diagnosis not present

## 2018-01-03 ENCOUNTER — Other Ambulatory Visit: Payer: Self-pay | Admitting: Family Medicine

## 2018-01-03 DIAGNOSIS — Z1231 Encounter for screening mammogram for malignant neoplasm of breast: Secondary | ICD-10-CM

## 2018-01-08 ENCOUNTER — Ambulatory Visit: Payer: Medicare HMO | Admitting: Endocrinology

## 2018-01-08 ENCOUNTER — Telehealth: Payer: Self-pay | Admitting: Nutrition

## 2018-01-08 NOTE — Telephone Encounter (Signed)
Pt. Called to cancel her reclast appt.   Will be keeping her lab appt. That morning.  Gave no reason why, and did not want to reschedule.

## 2018-01-08 NOTE — Telephone Encounter (Signed)
Please let her know that if she is not going to take Reclast infusion she will need to go back on generic Actonel once a month orally

## 2018-01-08 NOTE — Telephone Encounter (Signed)
Patient was called and informed of this and patient stated that she was going to call and reschedule the relcast infusion. Patient was asked to call and reschedule as soon as possible.

## 2018-01-15 ENCOUNTER — Ambulatory Visit: Payer: Medicare HMO

## 2018-01-15 ENCOUNTER — Other Ambulatory Visit (INDEPENDENT_AMBULATORY_CARE_PROVIDER_SITE_OTHER): Payer: Medicare HMO

## 2018-01-15 DIAGNOSIS — E063 Autoimmune thyroiditis: Secondary | ICD-10-CM

## 2018-01-15 DIAGNOSIS — H6121 Impacted cerumen, right ear: Secondary | ICD-10-CM | POA: Diagnosis not present

## 2018-01-15 DIAGNOSIS — I10 Essential (primary) hypertension: Secondary | ICD-10-CM

## 2018-01-15 LAB — BASIC METABOLIC PANEL
BUN: 30 mg/dL — AB (ref 6–23)
CHLORIDE: 105 meq/L (ref 96–112)
CO2: 31 meq/L (ref 19–32)
Calcium: 9.4 mg/dL (ref 8.4–10.5)
Creatinine, Ser: 0.75 mg/dL (ref 0.40–1.20)
GFR: 81.22 mL/min (ref >60.00)
Glucose, Bld: 92 mg/dL (ref 70–99)
POTASSIUM: 4.2 meq/L (ref 3.5–5.1)
Sodium: 142 mEq/L (ref 135–145)

## 2018-01-15 LAB — T4, FREE: FREE T4: 1.14 ng/dL (ref 0.60–1.60)

## 2018-01-15 LAB — TSH: TSH: 2.23 u[IU]/mL (ref 0.35–4.50)

## 2018-01-23 ENCOUNTER — Telehealth: Payer: Self-pay | Admitting: Endocrinology

## 2018-01-23 NOTE — Telephone Encounter (Signed)
Please advise 

## 2018-01-23 NOTE — Telephone Encounter (Signed)
Patient would like to know the results from her blood work.   Patient stated it is okay to leave message if she does not answer.

## 2018-01-23 NOTE — Telephone Encounter (Signed)
Her thyroid is back to normal with 75 mcg levothyroxine and will continue However she needs to double the dose of her vitamin D as it is still low

## 2018-01-23 NOTE — Telephone Encounter (Signed)
Patient called and left detailed message for patient.

## 2018-02-03 ENCOUNTER — Telehealth: Payer: Self-pay | Admitting: Endocrinology

## 2018-02-03 NOTE — Telephone Encounter (Signed)
Called pt and gave her MD message. PT verbalized understanding. 

## 2018-02-03 NOTE — Telephone Encounter (Signed)
Fatigue is not likely from the 5 mg lisinopril she is taking which is a low dose.  May need to see her PCP for evaluation

## 2018-02-03 NOTE — Telephone Encounter (Signed)
Please advise 

## 2018-02-03 NOTE — Telephone Encounter (Signed)
Patient would like to know if she should continue her blood pressure medication. She stated she is still as tired as before . Patient would like a call back to discuss this   Please advise

## 2018-02-05 DIAGNOSIS — H524 Presbyopia: Secondary | ICD-10-CM | POA: Diagnosis not present

## 2018-02-05 DIAGNOSIS — H52203 Unspecified astigmatism, bilateral: Secondary | ICD-10-CM | POA: Diagnosis not present

## 2018-02-05 DIAGNOSIS — H40033 Anatomical narrow angle, bilateral: Secondary | ICD-10-CM | POA: Diagnosis not present

## 2018-02-05 DIAGNOSIS — H2513 Age-related nuclear cataract, bilateral: Secondary | ICD-10-CM | POA: Diagnosis not present

## 2018-02-12 ENCOUNTER — Ambulatory Visit
Admission: RE | Admit: 2018-02-12 | Discharge: 2018-02-12 | Disposition: A | Discharge status: Home / Self Care | Payer: Medicare HMO | Source: Ambulatory Visit | Attending: Family Medicine | Admitting: Family Medicine

## 2018-02-12 DIAGNOSIS — Z1231 Encounter for screening mammogram for malignant neoplasm of breast: Secondary | ICD-10-CM | POA: Diagnosis not present

## 2018-02-17 DIAGNOSIS — E890 Postprocedural hypothyroidism: Secondary | ICD-10-CM | POA: Diagnosis not present

## 2018-02-17 DIAGNOSIS — J438 Other emphysema: Secondary | ICD-10-CM | POA: Diagnosis not present

## 2018-02-17 DIAGNOSIS — R5383 Other fatigue: Secondary | ICD-10-CM | POA: Diagnosis not present

## 2018-02-24 ENCOUNTER — Other Ambulatory Visit: Payer: Self-pay | Admitting: Endocrinology

## 2018-03-04 DIAGNOSIS — L218 Other seborrheic dermatitis: Secondary | ICD-10-CM | POA: Diagnosis not present

## 2018-04-01 DIAGNOSIS — M79672 Pain in left foot: Secondary | ICD-10-CM | POA: Diagnosis not present

## 2018-04-01 DIAGNOSIS — M79671 Pain in right foot: Secondary | ICD-10-CM | POA: Diagnosis not present

## 2018-04-01 DIAGNOSIS — M79604 Pain in right leg: Secondary | ICD-10-CM | POA: Diagnosis not present

## 2018-04-01 DIAGNOSIS — M79605 Pain in left leg: Secondary | ICD-10-CM | POA: Diagnosis not present

## 2018-04-03 ENCOUNTER — Telehealth: Payer: Self-pay | Admitting: Family Medicine

## 2018-04-03 NOTE — Telephone Encounter (Signed)
Best number [phone] Pt would like a  Call back regarding meds She went to dentist yesterday her bp was 156/70 today @ [facility] 161/70. Pt stated she has gain weight  124lbs 8/6  Before 5/9 123lbs and has restless legs

## 2018-04-04 NOTE — Telephone Encounter (Signed)
Lisinopril does not cause restless legs and she can make an appointment for follow-up early

## 2018-04-04 NOTE — Telephone Encounter (Signed)
Called pt and she was questioning if Lisinopril could cause restless legs. She is also wondering if an adjustment needs to be made on the medication because her blood pressure is reading high often.

## 2018-04-07 NOTE — Telephone Encounter (Signed)
Called pt and she stated that she would just wait and see if it gets any better.

## 2018-04-15 DIAGNOSIS — I10 Essential (primary) hypertension: Secondary | ICD-10-CM | POA: Diagnosis not present

## 2018-04-15 DIAGNOSIS — M79605 Pain in left leg: Secondary | ICD-10-CM | POA: Diagnosis not present

## 2018-04-15 DIAGNOSIS — E039 Hypothyroidism, unspecified: Secondary | ICD-10-CM | POA: Diagnosis not present

## 2018-04-15 DIAGNOSIS — M79604 Pain in right leg: Secondary | ICD-10-CM | POA: Diagnosis not present

## 2018-05-21 DIAGNOSIS — D3132 Benign neoplasm of left choroid: Secondary | ICD-10-CM | POA: Diagnosis not present

## 2018-05-21 DIAGNOSIS — H43813 Vitreous degeneration, bilateral: Secondary | ICD-10-CM | POA: Diagnosis not present

## 2018-05-21 DIAGNOSIS — H35372 Puckering of macula, left eye: Secondary | ICD-10-CM | POA: Diagnosis not present

## 2018-05-30 DIAGNOSIS — L299 Pruritus, unspecified: Secondary | ICD-10-CM | POA: Diagnosis not present

## 2018-05-30 DIAGNOSIS — H9203 Otalgia, bilateral: Secondary | ICD-10-CM | POA: Diagnosis not present

## 2018-05-30 DIAGNOSIS — Z23 Encounter for immunization: Secondary | ICD-10-CM | POA: Diagnosis not present

## 2018-05-30 DIAGNOSIS — H6123 Impacted cerumen, bilateral: Secondary | ICD-10-CM | POA: Diagnosis not present

## 2018-06-14 ENCOUNTER — Other Ambulatory Visit: Payer: Self-pay | Admitting: Endocrinology

## 2018-06-14 DIAGNOSIS — E063 Autoimmune thyroiditis: Secondary | ICD-10-CM

## 2018-06-14 DIAGNOSIS — E7800 Pure hypercholesterolemia, unspecified: Secondary | ICD-10-CM

## 2018-06-14 DIAGNOSIS — E559 Vitamin D deficiency, unspecified: Secondary | ICD-10-CM

## 2018-06-14 DIAGNOSIS — I10 Essential (primary) hypertension: Secondary | ICD-10-CM

## 2018-06-18 ENCOUNTER — Other Ambulatory Visit (INDEPENDENT_AMBULATORY_CARE_PROVIDER_SITE_OTHER): Payer: Medicare HMO

## 2018-06-18 DIAGNOSIS — E7800 Pure hypercholesterolemia, unspecified: Secondary | ICD-10-CM | POA: Diagnosis not present

## 2018-06-18 DIAGNOSIS — E559 Vitamin D deficiency, unspecified: Secondary | ICD-10-CM | POA: Diagnosis not present

## 2018-06-18 DIAGNOSIS — E063 Autoimmune thyroiditis: Secondary | ICD-10-CM

## 2018-06-18 DIAGNOSIS — I10 Essential (primary) hypertension: Secondary | ICD-10-CM

## 2018-06-18 LAB — COMPREHENSIVE METABOLIC PANEL
ALBUMIN: 4 g/dL (ref 3.5–5.2)
ALT: 21 U/L (ref 0–35)
AST: 25 U/L (ref 0–37)
Alkaline Phosphatase: 45 U/L (ref 39–117)
BUN: 22 mg/dL (ref 6–23)
CALCIUM: 9 mg/dL (ref 8.4–10.5)
CHLORIDE: 104 meq/L (ref 96–112)
CO2: 30 meq/L (ref 19–32)
Creatinine, Ser: 0.75 mg/dL (ref 0.40–1.20)
GFR: 81.13 mL/min (ref >60.00)
Glucose, Bld: 90 mg/dL (ref 70–99)
Potassium: 4.1 mEq/L (ref 3.5–5.1)
Sodium: 140 mEq/L (ref 135–145)
Total Bilirubin: 0.5 mg/dL (ref 0.2–1.2)
Total Protein: 6.6 g/dL (ref 6.0–8.3)

## 2018-06-18 LAB — LIPID PANEL
CHOL/HDL RATIO: 4
CHOLESTEROL: 179 mg/dL (ref 0–200)
HDL: 48 mg/dL (ref >39.00)
LDL CALC: 113 mg/dL — AB (ref 0–99)
NonHDL: 130.83
Triglycerides: 90 mg/dL (ref 0.0–149.0)
VLDL: 18 mg/dL (ref 0.0–40.0)

## 2018-06-18 LAB — VITAMIN D 25 HYDROXY (VIT D DEFICIENCY, FRACTURES): VITD: 31.84 ng/mL (ref 30.00–100.00)

## 2018-06-18 LAB — T4, FREE: FREE T4: 1.15 ng/dL (ref 0.60–1.60)

## 2018-06-18 LAB — TSH: TSH: 1.66 u[IU]/mL (ref 0.35–4.50)

## 2018-06-25 ENCOUNTER — Ambulatory Visit: Payer: Medicare HMO | Admitting: Endocrinology

## 2018-06-30 ENCOUNTER — Encounter: Payer: Self-pay | Admitting: Endocrinology

## 2018-06-30 ENCOUNTER — Ambulatory Visit (INDEPENDENT_AMBULATORY_CARE_PROVIDER_SITE_OTHER): Payer: Medicare HMO | Admitting: Endocrinology

## 2018-06-30 VITALS — BP 136/72 | HR 97 | Ht 67.0 in | Wt 127.0 lb

## 2018-06-30 DIAGNOSIS — E7800 Pure hypercholesterolemia, unspecified: Secondary | ICD-10-CM | POA: Diagnosis not present

## 2018-06-30 DIAGNOSIS — I10 Essential (primary) hypertension: Secondary | ICD-10-CM

## 2018-06-30 DIAGNOSIS — E890 Postprocedural hypothyroidism: Secondary | ICD-10-CM | POA: Diagnosis not present

## 2018-06-30 MED ORDER — RALOXIFENE HCL 60 MG PO TABS: 60.0000 mg | ORAL_TABLET | Freq: Every day | ORAL | 5 refills | Status: DC

## 2018-06-30 NOTE — Progress Notes (Signed)
Patient ID: Victoria Blair, female   DOB: 1945/10/01, 72 y.o.   MRN: [id number]    Reason for Appointment:  Follow-up visit for various problems   History of Present Illness:   HYPOTHYROIDISM: This was first diagnosed in 1978 after I-131 treatment for Graves' disease          In 1/15 because of low TSH the dose was reduced to 88 mcg   In 3/19 her TSH was relatively low the dose was reduced She is subsequently taking 75 mcg daily She tends to get fatigued and still thinks she gets cold  However TSH is consistently normal now  She does take her levothyroxine consistently as before in the morning before breakfast    Lab Results  Component Value Date   TSH 1.66 06/18/2018   TSH 2.23 01/15/2018   TSH 0.20 (L) 11/22/2017   FREET4 1.15 06/18/2018   FREET4 1.14 01/15/2018   FREET4 1.32 05/29/2017   '  HYPERTENSION: She has been on treatment since 2009 and had previously been on Lotrel for quite some time with good control.   Blood pressure was low normal in 11/2017 and her Lotrel was changed  With taking only lisinopril 5 mg daily she has relatively better blood pressure readings although high on the first measurement today Feels fairly good   Does not monitor at home    BP Readings from Last 3 Encounters:  06/30/18 136/72  12/23/17 132/74  11/29/17 95/64      Allergies as of 06/30/2018      Reactions   Hydrocodone-acetaminophen    Patient passed out with this medication   Erythromycin Base Other (See Comments)   Other      Medication List        Accurate as of 06/30/18  3:53 PM. Always use your most recent med list.          amlodipine-benazepril 2.5-10 MG capsule Commonly known as:  LOTREL TAKE ONE CAPSULE BY MOUTH EVERY DAY   buPROPion 100 MG 12 hr tablet Commonly known as:  WELLBUTRIN SR TAKE 1 TABLET (100 MG TOTAL) BY MOUTH DAILY.   CALCIUM + D PO Take 1 tablet by mouth daily.   cetirizine 10 MG tablet Commonly known as:   ZYRTEC Take 10 mg by mouth daily as needed for allergies.   fish oil-omega-3 fatty acids 1000 MG capsule Take 1 g by mouth daily.   fluticasone 50 MCG/ACT nasal spray Commonly known as:  FLONASE Place 2 sprays into both nostrils daily.   gabapentin 100 MG capsule Commonly known as:  NEURONTIN Take 100 mg by mouth as needed.   levothyroxine 75 MCG tablet Commonly known as:  SYNTHROID, LEVOTHROID Take 1 tablet (75 mcg total) by mouth daily.   lisinopril 5 MG tablet Commonly known as:  PRINIVIL,ZESTRIL TAKE 1 TABLET BY MOUTH EVERY DAY   meloxicam 7.5 MG tablet Commonly known as:  MOBIC TAKE 1 TABLET (7.5 MG TOTAL) BY MOUTH DAILY.   multivitamin tablet Take 1 tablet by mouth daily.   raloxifene 60 MG tablet Commonly known as:  EVISTA Take 1 tablet (60 mg total) by mouth daily.   valACYclovir 500 MG tablet Commonly known as:  VALTREX Take 500 mg by mouth as needed.   VITAMIN E PO Take 1 tablet by mouth daily.       Past Medical History:  Diagnosis Date  . Breast cancer ([place]) 1997  . Depression   . GERD (gastroesophageal reflux disease)   .  Graves disease   . Hemorrhoids   . Hypertension   . Migraines   . Osteoporosis   . Personal history of radiation therapy 1998  . Vitamin D deficiency     Past Surgical History:  Procedure Laterality Date  . BREAST BIOPSY Right 08/11/1996  . BREAST LUMPECTOMY Right 1998  . CESAREAN SECTION    . CHOLECYSTECTOMY    . SPINE SURGERY      Right breast cancer treated with lumpectomy and radiation 2000, lumbar spine surgery     Family History  Problem Relation Age of Onset  . Heart disease Father   . Pulmonary fibrosis Father   . Hypertension Father   . Pancreatic cancer Sister   . Sleep apnea Son   . Diabetes Maternal Aunt     Social History:  reports that she has been smoking cigarettes. She has a 12.00 pack-year smoking history. She has never used smokeless tobacco. She reports that she drinks alcohol. She  reports that she does not use drugs.  Allergies:  Allergies  Allergen Reactions  . Hydrocodone-Acetaminophen     Patient passed out with this medication  . Erythromycin Base Other (See Comments)    Other    REVIEW of systems:  She has gained weight recently   Wt Readings from Last 3 Encounters:  06/30/18 127 lb (57.6 kg)  12/23/17 120 lb 6.4 oz (54.6 kg)  11/29/17 122 lb (55.3 kg)     Hyperlipidemia: She has a persistently mild increase in LDL, does not have any other risk factors She usually has a low-fat diet    Lab Results  Component Value Date   CHOL 179 06/18/2018   HDL 48.00 06/18/2018   LDLCALC 113 (H) 06/18/2018   TRIG 90.0 06/18/2018   CHOLHDL 4 06/18/2018     She has OSTEOPOROSIS; previously was given Actonel but stopped this because of difficulty with compliance.  T score at spine was -2.8 in October 2018, previously was -2.5 done by gynecologist in 2016 In her youth she was 5 feet 7-1/2 inches tall she has lost 1 inch No history of fractures.  In the past gynecologist was suggesting Fosamax but she did not want to do this because of fear of side effects.   She previously had been taking Evista since 5/09 She also went off of it because of cost  She has been recommended RECLAST and does not want to do this because of potential high cost  Has also been advised to take OTC vitamin D3, now taking 1000 daily with improvement in her level     Lab Results  Component Value Date   VD25OH 31.84 06/18/2018   VD25OH 25.66 (L) 11/22/2017   VD25OH 31.22 11/27/2016          Examination:   BP 136/72   Pulse 97   Ht 5\' 7"  (1.702 m)   Wt 127 lb (57.6 kg)   SpO2 98%   BMI 19.89 kg/m       Assessments / PLAN:   HYPERTENSION: The blood pressure is well controlled with lisinopril 5 mg daily and she will continue  Vitamin D deficiency: She will continue the 1000 units vitamin D3 regularly   HYPOTHYROIDISM, post ablative, long-standing Although she  complains of fatigue and cold intolerance her TSH is normal She will continue the same dose of 75 mcg  Osteoporosis: She agrees to start back on Evista    Follow-up in 6 months for routine follow-up    Thula Stewart Dwyane Dee  06/30/2018, 3:53 PM

## 2018-07-03 DIAGNOSIS — R109 Unspecified abdominal pain: Secondary | ICD-10-CM | POA: Diagnosis not present

## 2018-07-03 DIAGNOSIS — R195 Other fecal abnormalities: Secondary | ICD-10-CM | POA: Diagnosis not present

## 2018-07-03 DIAGNOSIS — Z0000 Encounter for general adult medical examination without abnormal findings: Secondary | ICD-10-CM | POA: Diagnosis not present

## 2018-07-13 DIAGNOSIS — R350 Frequency of micturition: Secondary | ICD-10-CM | POA: Diagnosis not present

## 2018-08-07 ENCOUNTER — Other Ambulatory Visit: Payer: Self-pay | Admitting: Endocrinology

## 2018-08-21 ENCOUNTER — Telehealth: Payer: Self-pay | Admitting: Endocrinology

## 2018-08-21 NOTE — Telephone Encounter (Signed)
Pt called and questioned whether her blood pressure medication would affect her thyroid. Pt was advised that this will not happen. Pt was read doctors last office visit note where it states that MD said her BP is well controlled and she will continue Lisinopril. Pt verbalized understanding.

## 2018-08-21 NOTE — Telephone Encounter (Signed)
lisinopril (PRINIVIL,ZESTRIL) 5 MG tablet   Patient stated that Dr Dwyane Dee wanted the patient to take this medication to "raise her blood pressure". She isnt sure why she is taking this medication And would like a call back to speak to someone about this medication before she goes to refill .  Please advise

## 2018-09-17 DIAGNOSIS — H6123 Impacted cerumen, bilateral: Secondary | ICD-10-CM | POA: Diagnosis not present

## 2018-09-26 DIAGNOSIS — H6123 Impacted cerumen, bilateral: Secondary | ICD-10-CM | POA: Diagnosis not present

## 2018-10-08 DIAGNOSIS — R69 Illness, unspecified: Secondary | ICD-10-CM | POA: Diagnosis not present

## 2018-10-24 ENCOUNTER — Telehealth: Payer: Self-pay | Admitting: Endocrinology

## 2018-10-24 NOTE — Telephone Encounter (Signed)
Patient states that Dr.Kumar changed her blood pressure medicine and thyroid medicine and she recently has started feeling very tired.  Please Advise, Thanks

## 2018-10-24 NOTE — Telephone Encounter (Signed)
Called pt and informed her of MD message. Pt verbalized understanding and stated that if she wanted blood work, she would call back and schedule an appt.

## 2018-10-24 NOTE — Telephone Encounter (Signed)
There were no changes made on her last visit in November.  She can either go to her PCP or come in to see me with labs

## 2018-10-26 DIAGNOSIS — K589 Irritable bowel syndrome without diarrhea: Secondary | ICD-10-CM | POA: Diagnosis not present

## 2018-11-05 DIAGNOSIS — R1012 Left upper quadrant pain: Secondary | ICD-10-CM | POA: Diagnosis not present

## 2018-11-06 ENCOUNTER — Other Ambulatory Visit: Payer: Self-pay | Admitting: Endocrinology

## 2018-11-16 ENCOUNTER — Other Ambulatory Visit: Payer: Self-pay | Admitting: Endocrinology

## 2018-11-27 DIAGNOSIS — L719 Rosacea, unspecified: Secondary | ICD-10-CM | POA: Diagnosis not present

## 2018-12-09 DIAGNOSIS — L219 Seborrheic dermatitis, unspecified: Secondary | ICD-10-CM | POA: Diagnosis not present

## 2018-12-09 DIAGNOSIS — L821 Other seborrheic keratosis: Secondary | ICD-10-CM | POA: Diagnosis not present

## 2018-12-09 DIAGNOSIS — L719 Rosacea, unspecified: Secondary | ICD-10-CM | POA: Diagnosis not present

## 2018-12-24 ENCOUNTER — Other Ambulatory Visit: Payer: Self-pay

## 2018-12-24 ENCOUNTER — Other Ambulatory Visit (INDEPENDENT_AMBULATORY_CARE_PROVIDER_SITE_OTHER): Payer: Medicare HMO

## 2018-12-24 DIAGNOSIS — I10 Essential (primary) hypertension: Secondary | ICD-10-CM | POA: Diagnosis not present

## 2018-12-24 DIAGNOSIS — E890 Postprocedural hypothyroidism: Secondary | ICD-10-CM | POA: Diagnosis not present

## 2018-12-24 DIAGNOSIS — E7800 Pure hypercholesterolemia, unspecified: Secondary | ICD-10-CM | POA: Diagnosis not present

## 2018-12-24 LAB — BASIC METABOLIC PANEL
BUN: 18 mg/dL (ref 6–23)
CO2: 31 mEq/L (ref 19–32)
Calcium: 9.4 mg/dL (ref 8.4–10.5)
Chloride: 103 mEq/L (ref 96–112)
Creatinine, Ser: 0.78 mg/dL (ref 0.40–1.20)
GFR: 72.84 mL/min (ref >60.00)
Glucose, Bld: 91 mg/dL (ref 70–99)
Potassium: 4 mEq/L (ref 3.5–5.1)
Sodium: 141 mEq/L (ref 135–145)

## 2018-12-24 LAB — LIPID PANEL
Cholesterol: 202 mg/dL — ABNORMAL HIGH (ref 0–200)
HDL: 50.7 mg/dL (ref >39.00)
LDL Cholesterol: 128 mg/dL — ABNORMAL HIGH (ref 0–99)
NonHDL: 151.48
Total CHOL/HDL Ratio: 4
Triglycerides: 116 mg/dL (ref 0.0–149.0)
VLDL: 23.2 mg/dL (ref 0.0–40.0)

## 2018-12-24 LAB — T4, FREE: Free T4: 1.19 ng/dL (ref 0.60–1.60)

## 2018-12-24 LAB — TSH: TSH: 1.11 u[IU]/mL (ref 0.35–4.50)

## 2018-12-29 DIAGNOSIS — S20212A Contusion of left front wall of thorax, initial encounter: Secondary | ICD-10-CM | POA: Diagnosis not present

## 2018-12-31 ENCOUNTER — Ambulatory Visit (INDEPENDENT_AMBULATORY_CARE_PROVIDER_SITE_OTHER): Payer: Medicare HMO | Admitting: Endocrinology

## 2018-12-31 ENCOUNTER — Encounter: Payer: Self-pay | Admitting: Endocrinology

## 2018-12-31 ENCOUNTER — Other Ambulatory Visit: Payer: Self-pay

## 2018-12-31 VITALS — BP 138/70 | HR 102 | Ht 67.0 in | Wt 128.0 lb

## 2018-12-31 DIAGNOSIS — I10 Essential (primary) hypertension: Secondary | ICD-10-CM | POA: Diagnosis not present

## 2018-12-31 DIAGNOSIS — E7800 Pure hypercholesterolemia, unspecified: Secondary | ICD-10-CM | POA: Diagnosis not present

## 2018-12-31 DIAGNOSIS — E890 Postprocedural hypothyroidism: Secondary | ICD-10-CM | POA: Diagnosis not present

## 2018-12-31 NOTE — Progress Notes (Signed)
Patient ID: Victoria Blair, female   DOB: [birth year], 73 y.o.   MRN: [id number]    Reason for Appointment:  Follow-up visit for various problems   History of Present Illness:   HYPOTHYROIDISM: This was first diagnosed in 1978 after I-131 treatment for Graves' disease          In 1/15 because of low TSH the dose was reduced to 88 mcg   In 3/19 her TSH was relatively low the dose was reduced She is subsequently taking 75 mcg daily No unusual fatigue recently and no weight change  However TSH is consistently normal  She is taking levothyroxine consistently as before in the morning before breakfast   Lab Results  Component Value Date   TSH 1.11 12/24/2018   TSH 1.66 06/18/2018   TSH 2.23 01/15/2018   FREET4 1.19 12/24/2018   FREET4 1.15 06/18/2018   FREET4 1.14 01/15/2018   '  HYPERTENSION: She has been on treatment since 2009 and had previously been on Lotrel for quite some time with good control.   Blood pressure was low normal in 11/2017 and her Lotrel was changed  With taking only lisinopril 5 mg daily blood pressure is controlled Blood pressure is slightly higher today but she has had recent pain and she feels stressed today   Does not monitor at home    BP Readings from Last 3 Encounters:  12/31/18 138/70  06/30/18 136/72  12/23/17 132/74     Allergies as of 12/31/2018      Reactions   Hydrocodone-acetaminophen    Patient passed out with this medication   Erythromycin Base Other (See Comments)   Other      Medication List       Accurate as of Dec 31, 2018  2:52 PM. Always use your most recent med list.        buPROPion 100 MG 12 hr tablet Commonly known as:  WELLBUTRIN SR TAKE 1 TABLET (100 MG TOTAL) BY MOUTH DAILY.   CALCIUM + D PO Take 1 tablet by mouth daily.   cetirizine 10 MG tablet Commonly known as:  ZYRTEC Take 10 mg by mouth daily as needed for allergies.   fish oil-omega-3 fatty acids 1000 MG capsule Take 1 g by mouth  daily.   fluticasone 50 MCG/ACT nasal spray Commonly known as:  FLONASE Place 2 sprays into both nostrils daily.   gabapentin 100 MG capsule Commonly known as:  NEURONTIN Take 100 mg by mouth as needed.   levothyroxine 75 MCG tablet Commonly known as:  SYNTHROID TAKE 1 TABLET BY MOUTH EVERY DAY   lisinopril 5 MG tablet Commonly known as:  ZESTRIL TAKE 1 TABLET BY MOUTH EVERY DAY   meloxicam 7.5 MG tablet Commonly known as:  MOBIC TAKE 1 TABLET (7.5 MG TOTAL) BY MOUTH DAILY.   multivitamin tablet Take 1 tablet by mouth daily.   raloxifene 60 MG tablet Commonly known as:  EVISTA TAKE 1 TABLET BY MOUTH EVERY DAY   valACYclovir 500 MG tablet Commonly known as:  VALTREX Take 500 mg by mouth as needed.   VITAMIN E PO Take 1 tablet by mouth daily.       Past Medical History:  Diagnosis Date  . Breast cancer ([place]) 1997  . Depression   . GERD (gastroesophageal reflux disease)   . Graves disease   . Hemorrhoids   . Hypertension   . Migraines   . Osteoporosis   . Personal history of radiation  therapy 1998  . Vitamin D deficiency     Past Surgical History:  Procedure Laterality Date  . BREAST BIOPSY Right 08/11/1996  . BREAST LUMPECTOMY Right 1998  . CESAREAN SECTION    . CHOLECYSTECTOMY    . SPINE SURGERY      Right breast cancer treated with lumpectomy and radiation 2000, lumbar spine surgery     Family History  Problem Relation Age of Onset  . Heart disease Father   . Pulmonary fibrosis Father   . Hypertension Father   . Pancreatic cancer Sister   . Sleep apnea Son   . Diabetes Maternal Aunt     Social History:  reports that she has been smoking cigarettes. She has a 12.00 pack-year smoking history. She has never used smokeless tobacco. She reports current alcohol use. She reports that she does not use drugs.  Allergies:  Allergies  Allergen Reactions  . Hydrocodone-Acetaminophen     Patient passed out with this medication  . Erythromycin Base  Other (See Comments)    Other    REVIEW of systems:  No recent weight change   Wt Readings from Last 3 Encounters:  12/31/18 128 lb (58.1 kg)  06/30/18 127 lb (57.6 kg)  12/23/17 120 lb 6.4 oz (54.6 kg)     Hyperlipidemia: She has a persistently mild increase in LDL, does not have any other risk factors She may not be consistent with her diet lately and her LDL is somewhat higher    Lab Results  Component Value Date   CHOL 202 (H) 12/24/2018   HDL 50.70 12/24/2018   LDLCALC 128 (H) 12/24/2018   TRIG 116.0 12/24/2018   CHOLHDL 4 12/24/2018     She has OSTEOPOROSIS; previously was given Actonel but stopped this because of difficulty with compliance.  T score at spine was -2.8 in October 2018, previously was -2.5 done by gynecologist in 2016  In her youth she was 5 feet 7-1/2 inches tall she has lost 1 inch No history of fractures.  In the past gynecologist was suggesting Fosamax but she did not want to do this because of fear of side effects.  She has been recommended RECLAST and does not want to do this because of potential high cost  She previously had been taking Evista since 5/09.  This was restarted on her visit in 05/2018 She has no side effects with this  Also on calcium and vitamin D, 1000 units of D3 daily     Lab Results  Component Value Date   VD25OH 31.84 06/18/2018   VD25OH 25.66 (L) 11/22/2017   VD25OH 31.22 11/27/2016   She recently had a fall and was seen by PCP. Has pain in the left chest area, has not had an x-ray       Examination:   BP 138/70   Pulse (!) 102   Ht 5\' 7"  (1.702 m)   Wt 128 lb (58.1 kg)   SpO2 96%   BMI 20.05 kg/m       Assessments / PLAN:   HYPERTENSION: The blood pressure is appearing controlled with lisinopril 5 mg daily and she will continue the same dose for now Blood pressure is higher today because of her chest wall pain   HYPOTHYROIDISM, post ablative, long-standing No recent unusual fatigue She is  compliant with her levothyroxine TSH is normal She will continue the same dose of 75 mcg  Osteoporosis: She will continue at this time for now However needs bone density on  her next visit, may need to consider changing this is worsening Discussed that if she has osteoporosis she may be more prone to traumatic fractures   Follow-up in 6 months for routine follow-up    Gareld Obrecht 12/31/2018, 2:52 PM

## 2019-01-03 DIAGNOSIS — R0781 Pleurodynia: Secondary | ICD-10-CM | POA: Diagnosis not present

## 2019-01-03 DIAGNOSIS — W19XXXA Unspecified fall, initial encounter: Secondary | ICD-10-CM | POA: Diagnosis not present

## 2019-01-09 DIAGNOSIS — R0781 Pleurodynia: Secondary | ICD-10-CM | POA: Diagnosis not present

## 2019-01-22 DIAGNOSIS — C50911 Malignant neoplasm of unspecified site of right female breast: Secondary | ICD-10-CM | POA: Diagnosis not present

## 2019-01-23 ENCOUNTER — Other Ambulatory Visit: Payer: Self-pay | Admitting: Family Medicine

## 2019-01-23 DIAGNOSIS — Z1231 Encounter for screening mammogram for malignant neoplasm of breast: Secondary | ICD-10-CM

## 2019-02-04 ENCOUNTER — Other Ambulatory Visit: Payer: Self-pay | Admitting: Endocrinology

## 2019-03-02 DIAGNOSIS — R69 Illness, unspecified: Secondary | ICD-10-CM | POA: Diagnosis not present

## 2019-03-10 DIAGNOSIS — H6123 Impacted cerumen, bilateral: Secondary | ICD-10-CM | POA: Diagnosis not present

## 2019-03-18 ENCOUNTER — Ambulatory Visit: Payer: Medicare HMO

## 2019-03-26 DIAGNOSIS — H35372 Puckering of macula, left eye: Secondary | ICD-10-CM | POA: Diagnosis not present

## 2019-03-26 DIAGNOSIS — Q141 Congenital malformation of retina: Secondary | ICD-10-CM | POA: Diagnosis not present

## 2019-03-26 DIAGNOSIS — D3132 Benign neoplasm of left choroid: Secondary | ICD-10-CM | POA: Diagnosis not present

## 2019-03-26 DIAGNOSIS — H353131 Nonexudative age-related macular degeneration, bilateral, early dry stage: Secondary | ICD-10-CM | POA: Diagnosis not present

## 2019-03-26 DIAGNOSIS — H35363 Drusen (degenerative) of macula, bilateral: Secondary | ICD-10-CM | POA: Diagnosis not present

## 2019-03-26 DIAGNOSIS — H25813 Combined forms of age-related cataract, bilateral: Secondary | ICD-10-CM | POA: Diagnosis not present

## 2019-04-06 ENCOUNTER — Telehealth: Payer: Self-pay | Admitting: Endocrinology

## 2019-04-06 DIAGNOSIS — G2581 Restless legs syndrome: Secondary | ICD-10-CM | POA: Diagnosis not present

## 2019-04-06 NOTE — Telephone Encounter (Signed)
Called pt for clarification. It appears Dr. Dwyane Dee requested labs be completed in May 2020, although lab results were found from April 2020. Uncertain if she meant ORDERS vs RESULTS. Pt clarified stating she requested RESULTS. Lba results mailed as requested.

## 2019-04-06 NOTE — Telephone Encounter (Signed)
Patient states she missed placed her lab results and would requested we mail them to her.  Please Advise, Thanks

## 2019-04-14 DIAGNOSIS — H35372 Puckering of macula, left eye: Secondary | ICD-10-CM | POA: Diagnosis not present

## 2019-04-14 DIAGNOSIS — H2513 Age-related nuclear cataract, bilateral: Secondary | ICD-10-CM | POA: Diagnosis not present

## 2019-04-14 DIAGNOSIS — H524 Presbyopia: Secondary | ICD-10-CM | POA: Diagnosis not present

## 2019-04-14 DIAGNOSIS — H40033 Anatomical narrow angle, bilateral: Secondary | ICD-10-CM | POA: Diagnosis not present

## 2019-04-21 ENCOUNTER — Telehealth: Payer: Self-pay | Admitting: Pulmonary Disease

## 2019-04-21 NOTE — Telephone Encounter (Signed)
Called pt and she wanted to know what VS diagnosed her with. 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 Pt understood and nothing further is needed.

## 2019-04-24 ENCOUNTER — Other Ambulatory Visit: Payer: Self-pay

## 2019-04-24 ENCOUNTER — Ambulatory Visit
Admission: RE | Admit: 2019-04-24 | Discharge: 2019-04-24 | Disposition: A | Discharge status: Home / Self Care | Payer: Medicare HMO | Source: Ambulatory Visit | Attending: Family Medicine | Admitting: Family Medicine

## 2019-04-24 DIAGNOSIS — Z1231 Encounter for screening mammogram for malignant neoplasm of breast: Secondary | ICD-10-CM

## 2019-04-30 DIAGNOSIS — H2512 Age-related nuclear cataract, left eye: Secondary | ICD-10-CM | POA: Diagnosis not present

## 2019-04-30 DIAGNOSIS — H25812 Combined forms of age-related cataract, left eye: Secondary | ICD-10-CM | POA: Diagnosis not present

## 2019-05-05 DIAGNOSIS — Z0000 Encounter for general adult medical examination without abnormal findings: Secondary | ICD-10-CM | POA: Diagnosis not present

## 2019-05-05 DIAGNOSIS — E039 Hypothyroidism, unspecified: Secondary | ICD-10-CM | POA: Diagnosis not present

## 2019-05-05 DIAGNOSIS — M810 Age-related osteoporosis without current pathological fracture: Secondary | ICD-10-CM | POA: Diagnosis not present

## 2019-05-05 DIAGNOSIS — K219 Gastro-esophageal reflux disease without esophagitis: Secondary | ICD-10-CM | POA: Diagnosis not present

## 2019-05-05 DIAGNOSIS — I10 Essential (primary) hypertension: Secondary | ICD-10-CM | POA: Diagnosis not present

## 2019-05-26 DIAGNOSIS — R69 Illness, unspecified: Secondary | ICD-10-CM | POA: Diagnosis not present

## 2019-06-04 DIAGNOSIS — H04122 Dry eye syndrome of left lacrimal gland: Secondary | ICD-10-CM | POA: Diagnosis not present

## 2019-06-06 DIAGNOSIS — R69 Illness, unspecified: Secondary | ICD-10-CM | POA: Diagnosis not present

## 2019-06-08 DIAGNOSIS — L821 Other seborrheic keratosis: Secondary | ICD-10-CM | POA: Diagnosis not present

## 2019-06-08 DIAGNOSIS — L718 Other rosacea: Secondary | ICD-10-CM | POA: Diagnosis not present

## 2019-06-08 DIAGNOSIS — L218 Other seborrheic dermatitis: Secondary | ICD-10-CM | POA: Diagnosis not present

## 2019-06-24 ENCOUNTER — Other Ambulatory Visit (INDEPENDENT_AMBULATORY_CARE_PROVIDER_SITE_OTHER): Payer: Medicare HMO

## 2019-06-24 ENCOUNTER — Other Ambulatory Visit: Payer: Self-pay

## 2019-06-24 DIAGNOSIS — E890 Postprocedural hypothyroidism: Secondary | ICD-10-CM | POA: Diagnosis not present

## 2019-06-24 DIAGNOSIS — E7800 Pure hypercholesterolemia, unspecified: Secondary | ICD-10-CM

## 2019-06-24 DIAGNOSIS — I10 Essential (primary) hypertension: Secondary | ICD-10-CM

## 2019-06-24 LAB — COMPREHENSIVE METABOLIC PANEL
ALT: 16 U/L (ref 0–35)
AST: 23 U/L (ref 0–37)
Albumin: 4.3 g/dL (ref 3.5–5.2)
Alkaline Phosphatase: 44 U/L (ref 39–117)
BUN: 25 mg/dL — ABNORMAL HIGH (ref 6–23)
CO2: 31 mEq/L (ref 19–32)
Calcium: 9.6 mg/dL (ref 8.4–10.5)
Chloride: 105 mEq/L (ref 96–112)
Creatinine, Ser: 0.77 mg/dL (ref 0.40–1.20)
GFR: 73.83 mL/min (ref >60.00)
Glucose, Bld: 93 mg/dL (ref 70–99)
Potassium: 4.1 mEq/L (ref 3.5–5.1)
Sodium: 142 mEq/L (ref 135–145)
Total Bilirubin: 0.4 mg/dL (ref 0.2–1.2)
Total Protein: 6.8 g/dL (ref 6.0–8.3)

## 2019-06-24 LAB — LIPID PANEL
Cholesterol: 187 mg/dL (ref 0–200)
HDL: 48.2 mg/dL (ref >39.00)
LDL Cholesterol: 122 mg/dL — ABNORMAL HIGH (ref 0–99)
NonHDL: 138.46
Total CHOL/HDL Ratio: 4
Triglycerides: 84 mg/dL (ref 0.0–149.0)
VLDL: 16.8 mg/dL (ref 0.0–40.0)

## 2019-06-25 LAB — TSH: TSH: 2.01 u[IU]/mL (ref 0.35–4.50)

## 2019-06-25 LAB — T4, FREE: Free T4: 1.14 ng/dL (ref 0.60–1.60)

## 2019-06-29 ENCOUNTER — Telehealth: Payer: Self-pay | Admitting: Endocrinology

## 2019-06-29 NOTE — Telephone Encounter (Signed)
Patient called re: patient requests that Lab results be faxed to: Attn: Aida at [facility] Fax# [phone] (for insurance purposes so that labs are not duplicated)

## 2019-06-29 NOTE — Telephone Encounter (Signed)
Labs printed and will be faxed.

## 2019-06-30 ENCOUNTER — Other Ambulatory Visit: Payer: Self-pay

## 2019-07-01 ENCOUNTER — Ambulatory Visit: Payer: Medicare HMO | Admitting: Endocrinology

## 2019-07-02 ENCOUNTER — Ambulatory Visit (INDEPENDENT_AMBULATORY_CARE_PROVIDER_SITE_OTHER): Payer: Medicare HMO | Admitting: Endocrinology

## 2019-07-02 ENCOUNTER — Encounter: Payer: Self-pay | Admitting: Endocrinology

## 2019-07-02 VITALS — BP 110/60 | HR 88 | Ht 67.0 in | Wt 123.6 lb

## 2019-07-02 DIAGNOSIS — M810 Age-related osteoporosis without current pathological fracture: Secondary | ICD-10-CM

## 2019-07-02 DIAGNOSIS — E890 Postprocedural hypothyroidism: Secondary | ICD-10-CM | POA: Diagnosis not present

## 2019-07-02 DIAGNOSIS — E7800 Pure hypercholesterolemia, unspecified: Secondary | ICD-10-CM | POA: Diagnosis not present

## 2019-07-02 DIAGNOSIS — I10 Essential (primary) hypertension: Secondary | ICD-10-CM

## 2019-07-02 NOTE — Progress Notes (Signed)
Patient ID: Victoria Blair, female   DOB: 01-Jan-1946, 73 y.o.   MRN: [id number]    Reason for Appointment:  Follow-up visit for various problems   History of Present Illness:   HYPOTHYROIDISM: This was first diagnosed in 1978 after I-131 treatment for Graves' disease          In 1/15 because of low TSH the dose was reduced to 88 mcg   In 3/19 her TSH was relatively low the dose was reduced She is subsequently taking 75 mcg daily  She has lost weight from other reasons Otherwise feels fairly good with her energy level  TSH is consistently normal  She is taking levothyroxine daily in the morning before breakfast   Lab Results  Component Value Date   TSH 2.01 06/24/2019   TSH 1.11 12/24/2018   TSH 1.66 06/18/2018   FREET4 1.14 06/24/2019   FREET4 1.19 12/24/2018   FREET4 1.15 06/18/2018   '  HYPERTENSION: She has been on treatment since 2009 and had previously been on Lotrel for quite some time with good control.   Blood pressure was low normal in 11/2017 and her Lotrel was changed  With taking only lisinopril 5 mg daily blood pressure is controlled Blood pressure was also checked by PCP in September and was normal No lightheadedness on standing up   Does not monitor at home    BP Readings from Last 3 Encounters:  07/02/19 110/60  12/31/18 138/70  06/30/18 136/72     Allergies as of 07/02/2019      Reactions   Hydrocodone-acetaminophen    Patient passed out with this medication   Erythromycin Base Other (See Comments)   Other      Medication List       Accurate as of July 02, 2019  4:15 PM. If you have any questions, ask your nurse or doctor.        buPROPion 100 MG 12 hr tablet Commonly known as: WELLBUTRIN SR TAKE 1 TABLET (100 MG TOTAL) BY MOUTH DAILY.   CALCIUM + D PO Take 1 tablet by mouth daily.   cetirizine 10 MG tablet Commonly known as: ZYRTEC Take 10 mg by mouth daily as needed for allergies.   fish oil-omega-3 fatty  acids 1000 MG capsule Take 1 g by mouth daily.   fluticasone 50 MCG/ACT nasal spray Commonly known as: FLONASE Place 2 sprays into both nostrils daily.   gabapentin 100 MG capsule Commonly known as: NEURONTIN Take 100 mg by mouth as needed.   levothyroxine 75 MCG tablet Commonly known as: SYNTHROID TAKE 1 TABLET BY MOUTH EVERY DAY   lisinopril 5 MG tablet Commonly known as: ZESTRIL TAKE 1 TABLET BY MOUTH EVERY DAY   meloxicam 7.5 MG tablet Commonly known as: MOBIC TAKE 1 TABLET (7.5 MG TOTAL) BY MOUTH DAILY.   multivitamin tablet Take 1 tablet by mouth daily.   raloxifene 60 MG tablet Commonly known as: EVISTA TAKE 1 TABLET BY MOUTH EVERY DAY   valACYclovir 500 MG tablet Commonly known as: VALTREX Take 500 mg by mouth as needed.   VITAMIN E PO Take 1 tablet by mouth daily.       Past Medical History:  Diagnosis Date  . Breast cancer ([place]) 1997  . Depression   . GERD (gastroesophageal reflux disease)   . Graves disease   . Hemorrhoids   . Hypertension   . Migraines   . Osteoporosis   . Personal history of radiation therapy  1998  . Vitamin D deficiency     Past Surgical History:  Procedure Laterality Date  . BREAST BIOPSY Right 08/11/1996  . BREAST LUMPECTOMY Right 1998  . CESAREAN SECTION    . CHOLECYSTECTOMY    . SPINE SURGERY      Right breast cancer treated with lumpectomy and radiation 2000, lumbar spine surgery     Family History  Problem Relation Age of Onset  . Heart disease Father   . Pulmonary fibrosis Father   . Hypertension Father   . Pancreatic cancer Sister   . Sleep apnea Son   . Diabetes Maternal Aunt     Social History:  reports that she has been smoking cigarettes. She has a 12.00 pack-year smoking history. She has never used smokeless tobacco. She reports current alcohol use. She reports that she does not use drugs.  Allergies:  Allergies  Allergen Reactions  . Hydrocodone-Acetaminophen     Patient passed out with this  medication  . Erythromycin Base Other (See Comments)    Other    REVIEW of systems:     Wt Readings from Last 3 Encounters:  07/02/19 123 lb 9.6 oz (56.1 kg)  12/31/18 128 lb (58.1 kg)  06/30/18 127 lb (57.6 kg)     Hyperlipidemia: She has a persistently mild increase in LDL, does not have any other risk factors LDL is relatively better   Lab Results  Component Value Date   CHOL 187 06/24/2019   CHOL 202 (H) 12/24/2018   CHOL 179 06/18/2018   Lab Results  Component Value Date   HDL 48.20 06/24/2019   HDL 50.70 12/24/2018   HDL 48.00 06/18/2018   Lab Results  Component Value Date   LDLCALC 122 (H) 06/24/2019   LDLCALC 128 (H) 12/24/2018   LDLCALC 113 (H) 06/18/2018   Lab Results  Component Value Date   TRIG 84.0 06/24/2019   TRIG 116.0 12/24/2018   TRIG 90.0 06/18/2018   Lab Results  Component Value Date   CHOLHDL 4 06/24/2019   CHOLHDL 4 12/24/2018   CHOLHDL 4 06/18/2018   No results found for: LDLDIRECT    She has OSTEOPOROSIS; previously was given Actonel but stopped this because of difficulty with compliance.  T score at spine was -2.8 in October 2018, previously was -2.5 done by gynecologist in 2016  In her youth she was 5 feet 7-1/2 inches tall she has lost 1 inch No history of fractures.  In the past gynecologist was suggesting Fosamax but she did not want to do this because of fear of side effects.  She has been recommended RECLAST and does not want to do this because of potential high cost  She previously had been taking Evista since 5/09.  This was restarted on her visit in 05/2018 Again she has stopped taking the 6-MP regularly because of the cost  Also on calcium and vitamin D, 1000 units of D3 daily     Lab Results  Component Value Date   VD25OH 31.84 06/18/2018   VD25OH 25.66 (L) 11/22/2017   VD25OH 31.22 11/27/2016          Examination:   BP 110/60 (BP Location: Left Arm, Patient Position: Sitting, Cuff Size: Normal)   Pulse  88   Ht 5\' 7"  (1.702 m)   Wt 123 lb 9.6 oz (56.1 kg)   SpO2 98%   BMI 19.36 kg/m       Assessments / PLAN:   HYPERTENSION: The blood pressure is well  controlled with lisinopril 5 mg daily  Blood pressure is normal but she has no symptoms and no change in renal function   HYPOTHYROIDISM, post ablative, long-standing She feels fairly good She is compliant with her levothyroxine TSH is normal again She will continue the same dose of 75 mcg  Osteoporosis:   She needs bone density on her next visit and will schedule  May consider switching her to Reclast especially if bone density is not improving and she may be able to afford this now To check vitamin D level on the next visit, in the meantime continue calcium and vitamin D supplements  Mild hypercholesterolemia: This is relatively better, may be related to her overall decreased intake and some weight loss We will continue to monitor periodically  Smoking cessation: She is only smoking intermittently and trying to do other activities to prevent smoking Encouraged her to completely quit  Preventive care: Done by PCP, has been less than 10 years since her Pneumovax  Follow-up in 6 months for routine follow-up  Total visit time for evaluation and management of multiple problems and counseling =25 minutes  Elayne Snare 07/02/2019, 4:15 PM

## 2019-07-10 DIAGNOSIS — Z23 Encounter for immunization: Secondary | ICD-10-CM | POA: Diagnosis not present

## 2019-07-10 DIAGNOSIS — Z136 Encounter for screening for cardiovascular disorders: Secondary | ICD-10-CM | POA: Diagnosis not present

## 2019-07-10 DIAGNOSIS — Z0000 Encounter for general adult medical examination without abnormal findings: Secondary | ICD-10-CM | POA: Diagnosis not present

## 2019-07-10 DIAGNOSIS — Z13220 Encounter for screening for lipoid disorders: Secondary | ICD-10-CM | POA: Diagnosis not present

## 2019-07-10 DIAGNOSIS — I10 Essential (primary) hypertension: Secondary | ICD-10-CM | POA: Diagnosis not present

## 2019-07-13 ENCOUNTER — Other Ambulatory Visit: Payer: Self-pay

## 2019-07-13 ENCOUNTER — Ambulatory Visit (INDEPENDENT_AMBULATORY_CARE_PROVIDER_SITE_OTHER)
Admission: RE | Admit: 2019-07-13 | Discharge: 2019-07-13 | Disposition: A | Discharge status: Home / Self Care | Payer: Medicare HMO | Source: Ambulatory Visit | Attending: Endocrinology | Admitting: Endocrinology

## 2019-07-13 DIAGNOSIS — M810 Age-related osteoporosis without current pathological fracture: Secondary | ICD-10-CM

## 2019-07-20 DIAGNOSIS — L820 Inflamed seborrheic keratosis: Secondary | ICD-10-CM | POA: Diagnosis not present

## 2019-07-20 DIAGNOSIS — Z23 Encounter for immunization: Secondary | ICD-10-CM | POA: Diagnosis not present

## 2019-07-20 DIAGNOSIS — L219 Seborrheic dermatitis, unspecified: Secondary | ICD-10-CM | POA: Diagnosis not present

## 2019-07-21 ENCOUNTER — Telehealth: Payer: Self-pay

## 2019-07-21 NOTE — Telephone Encounter (Signed)
Pt. Was scheduled for 12/2

## 2019-07-21 NOTE — Progress Notes (Signed)
Pt. Was scheduled.

## 2019-07-21 NOTE — Telephone Encounter (Signed)
Pt requested that she receive a call to schedule a Reclast infusion.

## 2019-07-21 NOTE — Telephone Encounter (Signed)
Scheduled for 07/29/19

## 2019-07-22 ENCOUNTER — Telehealth: Payer: Self-pay | Admitting: Endocrinology

## 2019-07-22 NOTE — Telephone Encounter (Signed)
Patient ph# [phone] called re: patient requests to be called at the ph# above to find out how much the infusion scheduled for 07/28/19 will cost. Patient's insurance company told patient that a Pre-certification would need to be done. Patient states that Leonia Reader gave her the procedure code but patient was unable to get the Pre-Certification. Patient states that she has never had to get a Pre-Certification done for infusion before since no longer having a HMO.

## 2019-07-27 NOTE — Telephone Encounter (Signed)
Ms. Victoria Blair said that her insurance will cover Zolendronic acid, and I told her that that is what we use, and she said her insurance would pay for this infusion.  She was confirmed to come in at 8 AM tomorrow.

## 2019-07-28 ENCOUNTER — Other Ambulatory Visit: Payer: Self-pay

## 2019-07-28 ENCOUNTER — Ambulatory Visit (INDEPENDENT_AMBULATORY_CARE_PROVIDER_SITE_OTHER): Payer: Medicare HMO | Admitting: Nutrition

## 2019-07-28 DIAGNOSIS — M810 Age-related osteoporosis without current pathological fracture: Secondary | ICD-10-CM

## 2019-07-28 NOTE — Patient Instructions (Signed)
Drink 3-5 glasses of water today. Call if questions

## 2019-07-28 NOTE — Progress Notes (Addendum)
We discussed how Reclast works and the side effects of this medication.  She had no questions.   Per Dr. Ronnie Derby note on 07/02/19, and after the patient  signed the consent, an IV of Normal Saline was starter in her left arm using a butterfiy needle at 8:25 AM.  After making sure the IV was intact, the REclast was started at 8:30 and infused until 9:08 AM.  After that, Normal salkine was infused to flush the tubing.  The IV was discontinued at 9:10 and the site showed no signes of redness or swelling. She was encouraged to drink 3-5 glasses of water today.  She had no final question  Procedure note reviewed and approved for appropriate documentation

## 2019-08-04 ENCOUNTER — Other Ambulatory Visit: Payer: Self-pay | Admitting: Endocrinology

## 2019-08-25 DIAGNOSIS — D485 Neoplasm of uncertain behavior of skin: Secondary | ICD-10-CM | POA: Diagnosis not present

## 2019-08-25 DIAGNOSIS — L821 Other seborrheic keratosis: Secondary | ICD-10-CM | POA: Diagnosis not present

## 2019-08-25 DIAGNOSIS — Z23 Encounter for immunization: Secondary | ICD-10-CM | POA: Diagnosis not present

## 2019-08-31 ENCOUNTER — Telehealth: Payer: Self-pay

## 2019-08-31 NOTE — Telephone Encounter (Signed)
Forwarding to Dr. Dwyane Dee

## 2019-08-31 NOTE — Telephone Encounter (Signed)
Patient recently was in the office to get her reclast and wanted to make sure it was ok for her to get her teeth cleaned by the dentist     Please call and advise

## 2019-08-31 NOTE — Telephone Encounter (Signed)
She should have no problem getting dental hygiene done

## 2019-08-31 NOTE — Telephone Encounter (Signed)
Spoke with pt - she understands there is no problem getting dental hygiene done

## 2019-09-10 DIAGNOSIS — H6123 Impacted cerumen, bilateral: Secondary | ICD-10-CM | POA: Diagnosis not present

## 2019-09-10 DIAGNOSIS — R69 Illness, unspecified: Secondary | ICD-10-CM | POA: Diagnosis not present

## 2019-09-14 ENCOUNTER — Telehealth: Payer: Self-pay

## 2019-09-14 NOTE — Telephone Encounter (Signed)
Patient called in wanting to know if the Reclast injection going to interfer with her getting her dental work. Because if it will she wants to hold off on the dental work she just needs to be sure because she really needs the dental work.      Please call and advise

## 2019-09-15 NOTE — Telephone Encounter (Signed)
Referring note to Dr. Dwyane Dee

## 2019-09-15 NOTE — Telephone Encounter (Signed)
Patient was told what Dr. Dwyane Dee below.  She reported good understanding of this.

## 2019-09-15 NOTE — Telephone Encounter (Signed)
Unless she is having an extraction there should be no problem doing the dental work

## 2019-09-25 ENCOUNTER — Ambulatory Visit: Payer: Self-pay

## 2019-09-28 ENCOUNTER — Telehealth: Payer: Self-pay

## 2019-09-28 NOTE — Telephone Encounter (Signed)
If she has an urgent procedure it should not be delayed as chances of any problem are very small.  Need to know if she has a major procedure pending

## 2019-09-28 NOTE — Telephone Encounter (Signed)
Patient calling to find out how much reclast was given-she is having dental work done that was unexpected so she wants to know if having reclast will interfere with this-please advise patient -a good call back number is [phone]

## 2019-09-28 NOTE — Telephone Encounter (Signed)
Patient says she is going to have a cap removed and the filling removed.  She may also need to have the tooth removed, but he will not know this until he starts the procedure.

## 2019-09-29 DIAGNOSIS — H35363 Drusen (degenerative) of macula, bilateral: Secondary | ICD-10-CM | POA: Diagnosis not present

## 2019-09-29 DIAGNOSIS — H25813 Combined forms of age-related cataract, bilateral: Secondary | ICD-10-CM | POA: Diagnosis not present

## 2019-09-29 DIAGNOSIS — D3132 Benign neoplasm of left choroid: Secondary | ICD-10-CM | POA: Diagnosis not present

## 2019-09-29 DIAGNOSIS — H353131 Nonexudative age-related macular degeneration, bilateral, early dry stage: Secondary | ICD-10-CM | POA: Diagnosis not present

## 2019-09-29 DIAGNOSIS — Q141 Congenital malformation of retina: Secondary | ICD-10-CM | POA: Diagnosis not present

## 2019-09-29 DIAGNOSIS — H35372 Puckering of macula, left eye: Secondary | ICD-10-CM | POA: Diagnosis not present

## 2019-09-30 DIAGNOSIS — Z1159 Encounter for screening for other viral diseases: Secondary | ICD-10-CM | POA: Diagnosis not present

## 2019-10-02 DIAGNOSIS — H6123 Impacted cerumen, bilateral: Secondary | ICD-10-CM | POA: Diagnosis not present

## 2019-10-03 ENCOUNTER — Ambulatory Visit: Payer: Medicare HMO | Attending: Internal Medicine

## 2019-10-03 DIAGNOSIS — Z23 Encounter for immunization: Secondary | ICD-10-CM | POA: Insufficient documentation

## 2019-10-03 NOTE — Progress Notes (Signed)
   Covid-19 Vaccination Clinic  Name:  Victoria Blair    MRN: [id number] DOB: [birth year]  10/03/2019  [name] was observed post Covid-19 immunization for 15 minutes without incidence. She was provided with Vaccine Information Sheet and instruction to access the V-Safe system.   [name] was instructed to call 911 with any severe reactions post vaccine: Marland Kitchen Difficulty breathing  . Swelling of your face and throat  . A fast heartbeat  . A bad rash all over your body  . Dizziness and weakness    Immunizations Administered    Name Date Dose VIS Date Route   Pfizer COVID-19 Vaccine 10/03/2019  2:00 PM 0.3 mL 08/07/2019 Intramuscular   Manufacturer: [place]   Lot: [id number]   [place]: [id number]

## 2019-10-05 DIAGNOSIS — D124 Benign neoplasm of descending colon: Secondary | ICD-10-CM | POA: Diagnosis not present

## 2019-10-05 DIAGNOSIS — K5730 Diverticulosis of large intestine without perforation or abscess without bleeding: Secondary | ICD-10-CM | POA: Diagnosis not present

## 2019-10-05 DIAGNOSIS — Z86010 Personal history of colonic polyps: Secondary | ICD-10-CM | POA: Diagnosis not present

## 2019-10-07 DIAGNOSIS — D124 Benign neoplasm of descending colon: Secondary | ICD-10-CM | POA: Diagnosis not present

## 2019-10-08 DIAGNOSIS — R350 Frequency of micturition: Secondary | ICD-10-CM | POA: Diagnosis not present

## 2019-10-25 ENCOUNTER — Other Ambulatory Visit: Payer: Self-pay | Admitting: Endocrinology

## 2019-10-28 ENCOUNTER — Ambulatory Visit: Payer: Medicare HMO | Attending: Internal Medicine

## 2019-10-28 DIAGNOSIS — Z23 Encounter for immunization: Secondary | ICD-10-CM | POA: Insufficient documentation

## 2019-10-28 NOTE — Progress Notes (Signed)
   Covid-19 Vaccination Clinic  Name:  Victoria Blair    MRN: [id number] DOB: [birth year]  10/28/2019  Victoria Blair was observed post Covid-19 immunization for 15 minutes without incident. She was provided with Vaccine Information Sheet and instruction to access the V-Safe system.   Victoria Blair was instructed to call 911 with any severe reactions post vaccine: Marland Kitchen Difficulty breathing  . Swelling of face and throat  . A fast heartbeat  . A bad rash all over body  . Dizziness and weakness   Immunizations Administered    Name Date Dose VIS Date Route   Pfizer COVID-19 Vaccine 10/28/2019  8:59 AM 0.3 mL 08/07/2019 Intramuscular   Manufacturer: [place]   Lot: [id number]   [place]: [id number]

## 2019-11-16 DIAGNOSIS — H6122 Impacted cerumen, left ear: Secondary | ICD-10-CM | POA: Diagnosis not present

## 2019-11-18 ENCOUNTER — Telehealth: Payer: Self-pay | Admitting: Endocrinology

## 2019-11-18 NOTE — Telephone Encounter (Signed)
Chart shows that last refill of this medication by Dr. Dwyane Dee is from 2018. Would you like to refill or defer to PCP?

## 2019-11-18 NOTE — Telephone Encounter (Signed)
MEDICATION: buPROPion (WELLBUTRIN SR) 100 MG 12 hr tablet  PHARMACY:   [id number] - [place], [place] - [address] Phone:  [phone]  Fax:  [phone]      IS THIS A 90 DAY SUPPLY : No  IS PATIENT OUT OF MEDICATION: Yes  IF NOT; HOW MUCH IS LEFT: 0  LAST APPOINTMENT DATE: @2 /28/2021  NEXT APPOINTMENT DATE:@5 /10/2019  DO WE HAVE YOUR PERMISSION TO LEAVE A DETAILED MESSAGE: Yes  OTHER COMMENTS:    **Let patient know to contact pharmacy at the end of the day to make sure medication is ready. **  ** Please notify patient to allow 48-72 hours to process**  **Encourage patient to contact the pharmacy for refills or they can request refills through [facility]**

## 2019-11-18 NOTE — Telephone Encounter (Signed)
To PCP

## 2019-11-18 NOTE — Telephone Encounter (Signed)
Called pt and informed her of this. Pt verbalized understanding.

## 2019-11-30 DIAGNOSIS — R69 Illness, unspecified: Secondary | ICD-10-CM | POA: Diagnosis not present

## 2019-12-07 DIAGNOSIS — K1329 Other disturbances of oral epithelium, including tongue: Secondary | ICD-10-CM | POA: Diagnosis not present

## 2019-12-14 DIAGNOSIS — L821 Other seborrheic keratosis: Secondary | ICD-10-CM | POA: Diagnosis not present

## 2019-12-14 DIAGNOSIS — H5212 Myopia, left eye: Secondary | ICD-10-CM | POA: Diagnosis not present

## 2019-12-14 DIAGNOSIS — L814 Other melanin hyperpigmentation: Secondary | ICD-10-CM | POA: Diagnosis not present

## 2019-12-14 DIAGNOSIS — L650 Telogen effluvium: Secondary | ICD-10-CM | POA: Diagnosis not present

## 2019-12-14 DIAGNOSIS — H2511 Age-related nuclear cataract, right eye: Secondary | ICD-10-CM | POA: Diagnosis not present

## 2019-12-14 DIAGNOSIS — L578 Other skin changes due to chronic exposure to nonionizing radiation: Secondary | ICD-10-CM | POA: Diagnosis not present

## 2019-12-14 DIAGNOSIS — H35372 Puckering of macula, left eye: Secondary | ICD-10-CM | POA: Diagnosis not present

## 2019-12-14 DIAGNOSIS — H26492 Other secondary cataract, left eye: Secondary | ICD-10-CM | POA: Diagnosis not present

## 2019-12-14 DIAGNOSIS — D225 Melanocytic nevi of trunk: Secondary | ICD-10-CM | POA: Diagnosis not present

## 2019-12-28 ENCOUNTER — Other Ambulatory Visit: Payer: Self-pay

## 2019-12-28 ENCOUNTER — Other Ambulatory Visit (INDEPENDENT_AMBULATORY_CARE_PROVIDER_SITE_OTHER): Payer: Medicare HMO

## 2019-12-28 DIAGNOSIS — E7800 Pure hypercholesterolemia, unspecified: Secondary | ICD-10-CM | POA: Diagnosis not present

## 2019-12-28 DIAGNOSIS — E890 Postprocedural hypothyroidism: Secondary | ICD-10-CM

## 2019-12-28 DIAGNOSIS — M810 Age-related osteoporosis without current pathological fracture: Secondary | ICD-10-CM | POA: Diagnosis not present

## 2019-12-28 LAB — T4, FREE: Free T4: 1.31 ng/dL (ref 0.60–1.60)

## 2019-12-28 LAB — COMPREHENSIVE METABOLIC PANEL
ALT: 18 U/L (ref 0–35)
AST: 23 U/L (ref 0–37)
Albumin: 4.2 g/dL (ref 3.5–5.2)
Alkaline Phosphatase: 37 U/L — ABNORMAL LOW (ref 39–117)
BUN: 18 mg/dL (ref 6–23)
CO2: 31 mEq/L (ref 19–32)
Calcium: 9.4 mg/dL (ref 8.4–10.5)
Chloride: 102 mEq/L (ref 96–112)
Creatinine, Ser: 0.74 mg/dL (ref 0.40–1.20)
GFR: 77.18 mL/min (ref >60.00)
Glucose, Bld: 97 mg/dL (ref 70–99)
Potassium: 4 mEq/L (ref 3.5–5.1)
Sodium: 139 mEq/L (ref 135–145)
Total Bilirubin: 0.4 mg/dL (ref 0.2–1.2)
Total Protein: 6.6 g/dL (ref 6.0–8.3)

## 2019-12-28 LAB — LIPID PANEL
Cholesterol: 188 mg/dL (ref 0–200)
HDL: 46.6 mg/dL (ref >39.00)
LDL Cholesterol: 124 mg/dL — ABNORMAL HIGH (ref 0–99)
NonHDL: 141.75
Total CHOL/HDL Ratio: 4
Triglycerides: 90 mg/dL (ref 0.0–149.0)
VLDL: 18 mg/dL (ref 0.0–40.0)

## 2019-12-28 LAB — VITAMIN D 25 HYDROXY (VIT D DEFICIENCY, FRACTURES): VITD: 20.4 ng/mL — ABNORMAL LOW (ref 30.00–100.00)

## 2019-12-28 LAB — TSH: TSH: 2.6 u[IU]/mL (ref 0.35–4.50)

## 2019-12-31 ENCOUNTER — Other Ambulatory Visit: Payer: Self-pay

## 2019-12-31 ENCOUNTER — Encounter: Payer: Self-pay | Admitting: Endocrinology

## 2019-12-31 ENCOUNTER — Ambulatory Visit: Payer: Medicare HMO | Admitting: Endocrinology

## 2019-12-31 VITALS — BP 138/70 | HR 74 | Ht 67.0 in | Wt 119.2 lb

## 2019-12-31 DIAGNOSIS — I10 Essential (primary) hypertension: Secondary | ICD-10-CM

## 2019-12-31 DIAGNOSIS — E890 Postprocedural hypothyroidism: Secondary | ICD-10-CM | POA: Diagnosis not present

## 2019-12-31 DIAGNOSIS — E7800 Pure hypercholesterolemia, unspecified: Secondary | ICD-10-CM

## 2019-12-31 DIAGNOSIS — E559 Vitamin D deficiency, unspecified: Secondary | ICD-10-CM | POA: Diagnosis not present

## 2019-12-31 DIAGNOSIS — M810 Age-related osteoporosis without current pathological fracture: Secondary | ICD-10-CM

## 2019-12-31 NOTE — Progress Notes (Signed)
Patient ID: Victoria Blair, female   DOB: 1945-12-19, 74 y.o.   MRN: [id number]    Reason for Appointment:  Follow-up visit for various problems   History of Present Illness:    OSTEOPOROSIS; previously was given Actonel but stopped this because of difficulty with compliance.  T score at spine was -2.8 in October 2018, previously was -2.5 done by gynecologist in 2016 T score at the spine in 06/2019 was -3.1 and declined 4.6%  In her youth she was 5 feet 7-1/2 inches tall she has lost 1 inch No history of fractures.  She had been recommended Fosamax but she did not want to do this because of fear of side effects.   She previously had been taking Evista since 5/09.  This was restarted on her visit in 05/2018 Subsequently stopped this because of cost  She has been recommended RECLAST after her last bone density as above and she had infusion in 07/2019 without any side effect  Also on calcium but recently no  vitamin D She has a 2000 unit vitamin D3 at home but has not started it as yet  Lab Results  Component Value Date   VD25OH 20.40 (L) 12/28/2019   VD25OH 31.84 06/18/2018   VD25OH 25.66 (L) 11/22/2017   VD25OH 31.22 11/27/2016     HYPOTHYROIDISM: This was first diagnosed in 1978 after I-131 treatment for Graves' disease          Her dosages have been adjusted periodically  In 3/19 her TSH was relatively low the dose was reduced She is since then taking 75 mcg daily  She has lost weight again from other reasons No complaints of fatigue  TSH is consistently normal  She is taking levothyroxine daily in the morning before breakfast   Lab Results  Component Value Date   TSH 2.60 12/28/2019   TSH 2.01 06/24/2019   TSH 1.11 12/24/2018   FREET4 1.31 12/28/2019   FREET4 1.14 06/24/2019   FREET4 1.19 12/24/2018   '  HYPERTENSION: She has been on treatment since 2009 and had previously been on Lotrel for quite some time with good control.   Blood  pressure was low normal in 11/2017 and her Lotrel was changed  With taking only lisinopril 5 mg daily blood pressure is controlled Does not check at home      BP Readings from Last 3 Encounters:  12/31/19 138/70  07/02/19 110/60  12/31/18 138/70     Allergies as of 12/31/2019      Reactions   Hydrocodone-acetaminophen    Patient passed out with this medication   Erythromycin Base Other (See Comments)   Other      Medication List       Accurate as of Dec 31, 2019  9:22 AM. If you have any questions, ask your nurse or doctor.        STOP taking these medications   buPROPion 100 MG 12 hr tablet Commonly known as: WELLBUTRIN SR Stopped by: Elayne Snare, MD   cetirizine 10 MG tablet Commonly known as: ZYRTEC Stopped by: Elayne Snare, MD   fluticasone 50 MCG/ACT nasal spray Commonly known as: FLONASE Stopped by: Elayne Snare, MD   multivitamin tablet Stopped by: Elayne Snare, MD   raloxifene 60 MG tablet Commonly known as: EVISTA Stopped by: Elayne Snare, MD   valACYclovir 500 MG tablet Commonly known as: VALTREX Stopped by: Elayne Snare, MD     TAKE these medications   CALCIUM +  D PO Take 1 tablet by mouth daily.   fish oil-omega-3 fatty acids 1000 MG capsule Take 1 g by mouth daily.   gabapentin 100 MG capsule Commonly known as: NEURONTIN Take 100 mg by mouth as needed.   levothyroxine 75 MCG tablet Commonly known as: SYNTHROID TAKE 1 TABLET BY MOUTH EVERY DAY   lisinopril 5 MG tablet Commonly known as: ZESTRIL TAKE 1 TABLET BY MOUTH EVERY DAY   meloxicam 7.5 MG tablet Commonly known as: MOBIC TAKE 1 TABLET (7.5 MG TOTAL) BY MOUTH DAILY. What changed:   when to take this  reasons to take this   VITAMIN E PO Take 1 tablet by mouth daily.       Past Medical History:  Diagnosis Date  . Breast cancer ([place]) 1997  . Depression   . GERD (gastroesophageal reflux disease)   . Graves disease   . Hemorrhoids   . Hypertension   . Migraines   .  Osteoporosis   . Personal history of radiation therapy 1998  . Vitamin D deficiency     Past Surgical History:  Procedure Laterality Date  . BREAST BIOPSY Right 08/11/1996  . BREAST LUMPECTOMY Right 1998  . CESAREAN SECTION    . CHOLECYSTECTOMY    . SPINE SURGERY      Right breast cancer treated with lumpectomy and radiation 2000, lumbar spine surgery     Family History  Problem Relation Age of Onset  . Heart disease Father   . Pulmonary fibrosis Father   . Hypertension Father   . Pancreatic cancer Sister   . Sleep apnea Son   . Diabetes Maternal Aunt     Social History:  reports that she has been smoking cigarettes. She has a 12.00 pack-year smoking history. She has never used smokeless tobacco. She reports current alcohol use. She reports that she does not use drugs.  Allergies:  Allergies  Allergen Reactions  . Hydrocodone-Acetaminophen     Patient passed out with this medication  . Erythromycin Base Other (See Comments)    Other    REVIEW of systems:  She continues to lose weight gradually She thinks that she sometimes is too tired to cook and eat and will sometimes forget to eat also Has not discussed with PCP   Wt Readings from Last 3 Encounters:  12/31/19 119 lb 3.2 oz (54.1 kg)  07/02/19 123 lb 9.6 oz (56.1 kg)  12/31/18 128 lb (58.1 kg)     Hyperlipidemia: She has a persistently mild increase in LDL, unchanged Does not have any other risk factors LDL consistently below 130   Lab Results  Component Value Date   CHOL 188 12/28/2019   CHOL 187 06/24/2019   CHOL 202 (H) 12/24/2018   Lab Results  Component Value Date   HDL 46.60 12/28/2019   HDL 48.20 06/24/2019   HDL 50.70 12/24/2018   Lab Results  Component Value Date   LDLCALC 124 (H) 12/28/2019   LDLCALC 122 (H) 06/24/2019   LDLCALC 128 (H) 12/24/2018   Lab Results  Component Value Date   TRIG 90.0 12/28/2019   TRIG 84.0 06/24/2019   TRIG 116.0 12/24/2018   Lab Results  Component  Value Date   CHOLHDL 4 12/28/2019   CHOLHDL 4 06/24/2019   CHOLHDL 4 12/24/2018   No results found for: LDLDIRECT           Examination:   BP 138/70 (BP Location: Left Arm, Patient Position: Sitting, Cuff Size: Normal)   Pulse  74   Ht 5\' 7"  (1.702 m)   Wt 119 lb 3.2 oz (54.1 kg)   SpO2 98%   BMI 18.67 kg/m       Assessments / PLAN:   Osteoporosis:   Has started Reclast for T score of -3.1 at the spine  She is finally able to start the medication that she can afford and has no side effects She will need this again in December  Her vitamin D level is significantly low and she has not taken any supplements as recommended Since she already has the 2000 units vitamin D3 at home she can start taking this now daily  HYPERTENSION: The blood pressure is consistently well controlled with lisinopril 5 mg daily  She will continue the same dose   HYPOTHYROIDISM, post ablative, long-standing Subjectively doing well TSH is normal  She will continue the same dose of 75 mcg generic levothyroxine and take it every morning on empty stomach  Mild hypercholesterolemia: Stable with LDL below 130 consistently   She will follow up with her PCP for general exam and evaluation of her persistent weight loss However likely she needs to establish a routine of eating 3 meals a day  Follow-up in 6 months for routine follow-up    Amarisa Wilinski 12/31/2019, 9:22 AM

## 2019-12-31 NOTE — Patient Instructions (Addendum)
Take at least 2000 Units Vitamin D3 daily

## 2020-01-04 DIAGNOSIS — B001 Herpesviral vesicular dermatitis: Secondary | ICD-10-CM | POA: Diagnosis not present

## 2020-01-04 DIAGNOSIS — R636 Underweight: Secondary | ICD-10-CM | POA: Diagnosis not present

## 2020-01-04 DIAGNOSIS — E559 Vitamin D deficiency, unspecified: Secondary | ICD-10-CM | POA: Diagnosis not present

## 2020-01-23 ENCOUNTER — Other Ambulatory Visit: Payer: Self-pay | Admitting: Endocrinology

## 2020-02-01 DIAGNOSIS — K1329 Other disturbances of oral epithelium, including tongue: Secondary | ICD-10-CM | POA: Diagnosis not present

## 2020-02-20 DIAGNOSIS — N390 Urinary tract infection, site not specified: Secondary | ICD-10-CM | POA: Diagnosis not present

## 2020-02-20 DIAGNOSIS — B373 Candidiasis of vulva and vagina: Secondary | ICD-10-CM | POA: Diagnosis not present

## 2020-02-20 DIAGNOSIS — R300 Dysuria: Secondary | ICD-10-CM | POA: Diagnosis not present

## 2020-02-28 DIAGNOSIS — R197 Diarrhea, unspecified: Secondary | ICD-10-CM | POA: Diagnosis not present

## 2020-03-07 DIAGNOSIS — B370 Candidal stomatitis: Secondary | ICD-10-CM | POA: Diagnosis not present

## 2020-03-11 DIAGNOSIS — M545 Low back pain: Secondary | ICD-10-CM | POA: Diagnosis not present

## 2020-03-11 DIAGNOSIS — R8290 Unspecified abnormal findings in urine: Secondary | ICD-10-CM | POA: Diagnosis not present

## 2020-03-24 ENCOUNTER — Other Ambulatory Visit: Payer: Self-pay | Admitting: Family Medicine

## 2020-03-24 DIAGNOSIS — Z1231 Encounter for screening mammogram for malignant neoplasm of breast: Secondary | ICD-10-CM

## 2020-04-25 ENCOUNTER — Ambulatory Visit
Admission: RE | Admit: 2020-04-25 | Discharge: 2020-04-25 | Disposition: A | Discharge status: Home / Self Care | Payer: Medicare HMO | Source: Ambulatory Visit | Attending: Family Medicine | Admitting: Family Medicine

## 2020-04-25 ENCOUNTER — Other Ambulatory Visit: Payer: Self-pay

## 2020-04-25 DIAGNOSIS — Z1231 Encounter for screening mammogram for malignant neoplasm of breast: Secondary | ICD-10-CM | POA: Diagnosis not present

## 2020-05-19 DIAGNOSIS — R69 Illness, unspecified: Secondary | ICD-10-CM | POA: Diagnosis not present

## 2020-05-23 ENCOUNTER — Other Ambulatory Visit: Payer: Self-pay | Admitting: Endocrinology

## 2020-05-26 DIAGNOSIS — J438 Other emphysema: Secondary | ICD-10-CM | POA: Diagnosis not present

## 2020-05-26 DIAGNOSIS — I10 Essential (primary) hypertension: Secondary | ICD-10-CM | POA: Diagnosis not present

## 2020-05-26 DIAGNOSIS — D509 Iron deficiency anemia, unspecified: Secondary | ICD-10-CM | POA: Diagnosis not present

## 2020-05-26 DIAGNOSIS — E039 Hypothyroidism, unspecified: Secondary | ICD-10-CM | POA: Diagnosis not present

## 2020-05-26 DIAGNOSIS — M810 Age-related osteoporosis without current pathological fracture: Secondary | ICD-10-CM | POA: Diagnosis not present

## 2020-05-26 DIAGNOSIS — E890 Postprocedural hypothyroidism: Secondary | ICD-10-CM | POA: Diagnosis not present

## 2020-05-30 DIAGNOSIS — R636 Underweight: Secondary | ICD-10-CM | POA: Diagnosis not present

## 2020-06-14 DIAGNOSIS — L820 Inflamed seborrheic keratosis: Secondary | ICD-10-CM | POA: Diagnosis not present

## 2020-06-14 DIAGNOSIS — R634 Abnormal weight loss: Secondary | ICD-10-CM | POA: Diagnosis not present

## 2020-06-15 DIAGNOSIS — R69 Illness, unspecified: Secondary | ICD-10-CM | POA: Diagnosis not present

## 2020-06-27 DIAGNOSIS — H35372 Puckering of macula, left eye: Secondary | ICD-10-CM | POA: Diagnosis not present

## 2020-06-27 DIAGNOSIS — H52203 Unspecified astigmatism, bilateral: Secondary | ICD-10-CM | POA: Diagnosis not present

## 2020-06-27 DIAGNOSIS — H2511 Age-related nuclear cataract, right eye: Secondary | ICD-10-CM | POA: Diagnosis not present

## 2020-06-27 DIAGNOSIS — H26492 Other secondary cataract, left eye: Secondary | ICD-10-CM | POA: Diagnosis not present

## 2020-07-04 ENCOUNTER — Other Ambulatory Visit: Payer: Medicare HMO

## 2020-07-06 DIAGNOSIS — E039 Hypothyroidism, unspecified: Secondary | ICD-10-CM | POA: Diagnosis not present

## 2020-07-06 DIAGNOSIS — I10 Essential (primary) hypertension: Secondary | ICD-10-CM | POA: Diagnosis not present

## 2020-07-06 DIAGNOSIS — Z720 Tobacco use: Secondary | ICD-10-CM | POA: Diagnosis not present

## 2020-07-06 DIAGNOSIS — Z681 Body mass index (BMI) 19 or less, adult: Secondary | ICD-10-CM | POA: Diagnosis not present

## 2020-07-06 DIAGNOSIS — Z809 Family history of malignant neoplasm, unspecified: Secondary | ICD-10-CM | POA: Diagnosis not present

## 2020-07-06 DIAGNOSIS — R636 Underweight: Secondary | ICD-10-CM | POA: Diagnosis not present

## 2020-07-06 DIAGNOSIS — H269 Unspecified cataract: Secondary | ICD-10-CM | POA: Diagnosis not present

## 2020-07-06 DIAGNOSIS — F419 Anxiety disorder, unspecified: Secondary | ICD-10-CM | POA: Diagnosis not present

## 2020-07-06 DIAGNOSIS — R69 Illness, unspecified: Secondary | ICD-10-CM | POA: Diagnosis not present

## 2020-07-06 DIAGNOSIS — B353 Tinea pedis: Secondary | ICD-10-CM | POA: Diagnosis not present

## 2020-07-11 ENCOUNTER — Ambulatory Visit: Payer: Medicare HMO | Admitting: Endocrinology

## 2020-07-18 DIAGNOSIS — E7800 Pure hypercholesterolemia, unspecified: Secondary | ICD-10-CM | POA: Diagnosis not present

## 2020-07-18 DIAGNOSIS — I10 Essential (primary) hypertension: Secondary | ICD-10-CM | POA: Diagnosis not present

## 2020-07-18 DIAGNOSIS — D509 Iron deficiency anemia, unspecified: Secondary | ICD-10-CM | POA: Diagnosis not present

## 2020-07-18 DIAGNOSIS — E890 Postprocedural hypothyroidism: Secondary | ICD-10-CM | POA: Diagnosis not present

## 2020-07-18 DIAGNOSIS — Z0000 Encounter for general adult medical examination without abnormal findings: Secondary | ICD-10-CM | POA: Diagnosis not present

## 2020-07-25 ENCOUNTER — Other Ambulatory Visit: Payer: Self-pay

## 2020-07-25 ENCOUNTER — Other Ambulatory Visit (INDEPENDENT_AMBULATORY_CARE_PROVIDER_SITE_OTHER): Payer: Medicare HMO

## 2020-07-25 DIAGNOSIS — I10 Essential (primary) hypertension: Secondary | ICD-10-CM | POA: Diagnosis not present

## 2020-07-25 DIAGNOSIS — E890 Postprocedural hypothyroidism: Secondary | ICD-10-CM

## 2020-07-25 DIAGNOSIS — E559 Vitamin D deficiency, unspecified: Secondary | ICD-10-CM

## 2020-07-25 DIAGNOSIS — M810 Age-related osteoporosis without current pathological fracture: Secondary | ICD-10-CM

## 2020-07-25 DIAGNOSIS — E7800 Pure hypercholesterolemia, unspecified: Secondary | ICD-10-CM | POA: Diagnosis not present

## 2020-07-25 LAB — CBC
HCT: 33.4 % — ABNORMAL LOW (ref 36.0–46.0)
Hemoglobin: 11.3 g/dL — ABNORMAL LOW (ref 12.0–15.0)
MCHC: 33.7 g/dL (ref 30.0–36.0)
MCV: 93 fl (ref 78.0–100.0)
Platelets: 199 10*3/uL (ref 150.0–400.0)
RBC: 3.59 Mil/uL — ABNORMAL LOW (ref 3.87–5.11)
RDW: 13.4 % (ref 11.5–15.5)
WBC: 4.9 10*3/uL (ref 4.0–10.5)

## 2020-07-25 LAB — COMPREHENSIVE METABOLIC PANEL
ALT: 20 U/L (ref 0–35)
AST: 24 U/L (ref 0–37)
Albumin: 4.2 g/dL (ref 3.5–5.2)
Alkaline Phosphatase: 38 U/L — ABNORMAL LOW (ref 39–117)
BUN: 17 mg/dL (ref 6–23)
CO2: 32 mEq/L (ref 19–32)
Calcium: 9.3 mg/dL (ref 8.4–10.5)
Chloride: 104 mEq/L (ref 96–112)
Creatinine, Ser: 0.91 mg/dL (ref 0.40–1.20)
GFR: 63.07 mL/min (ref >60.00)
Glucose, Bld: 90 mg/dL (ref 70–99)
Potassium: 3.8 mEq/L (ref 3.5–5.1)
Sodium: 141 mEq/L (ref 135–145)
Total Bilirubin: 0.4 mg/dL (ref 0.2–1.2)
Total Protein: 6.8 g/dL (ref 6.0–8.3)

## 2020-07-25 LAB — T4, FREE: Free T4: 0.81 ng/dL (ref 0.60–1.60)

## 2020-07-25 LAB — TSH: TSH: 11.27 u[IU]/mL — ABNORMAL HIGH (ref 0.35–4.50)

## 2020-07-25 LAB — VITAMIN D 25 HYDROXY (VIT D DEFICIENCY, FRACTURES): VITD: 30.62 ng/mL (ref 30.00–100.00)

## 2020-07-26 DIAGNOSIS — R300 Dysuria: Secondary | ICD-10-CM | POA: Diagnosis not present

## 2020-08-01 ENCOUNTER — Encounter: Payer: Self-pay | Admitting: Endocrinology

## 2020-08-01 ENCOUNTER — Ambulatory Visit: Payer: Medicare HMO | Admitting: Endocrinology

## 2020-08-01 ENCOUNTER — Other Ambulatory Visit: Payer: Self-pay

## 2020-08-01 VITALS — BP 132/66 | HR 95 | Ht 67.0 in | Wt 108.2 lb

## 2020-08-01 DIAGNOSIS — E559 Vitamin D deficiency, unspecified: Secondary | ICD-10-CM | POA: Diagnosis not present

## 2020-08-01 DIAGNOSIS — D509 Iron deficiency anemia, unspecified: Secondary | ICD-10-CM | POA: Diagnosis not present

## 2020-08-01 DIAGNOSIS — E890 Postprocedural hypothyroidism: Secondary | ICD-10-CM

## 2020-08-01 DIAGNOSIS — J438 Other emphysema: Secondary | ICD-10-CM | POA: Diagnosis not present

## 2020-08-01 DIAGNOSIS — I10 Essential (primary) hypertension: Secondary | ICD-10-CM

## 2020-08-01 DIAGNOSIS — M810 Age-related osteoporosis without current pathological fracture: Secondary | ICD-10-CM | POA: Diagnosis not present

## 2020-08-01 DIAGNOSIS — E7800 Pure hypercholesterolemia, unspecified: Secondary | ICD-10-CM | POA: Diagnosis not present

## 2020-08-01 DIAGNOSIS — D649 Anemia, unspecified: Secondary | ICD-10-CM

## 2020-08-01 DIAGNOSIS — E039 Hypothyroidism, unspecified: Secondary | ICD-10-CM | POA: Diagnosis not present

## 2020-08-01 DIAGNOSIS — K219 Gastro-esophageal reflux disease without esophagitis: Secondary | ICD-10-CM | POA: Diagnosis not present

## 2020-08-01 MED ORDER — LEVOTHYROXINE SODIUM 88 MCG PO TABS: 88.0000 ug | ORAL_TABLET | Freq: Every day | ORAL | 3 refills | Status: DC

## 2020-08-01 NOTE — Progress Notes (Signed)
Patient ID: Victoria Blair, female   DOB: 1946/03/29, 74 y.o.   MRN: [id number]    Reason for Appointment:  Follow-up visit for various problems   History of Present Illness:    OSTEOPOROSIS; previously was given Actonel but stopped this because of difficulty with compliance.  T score at spine was -2.8 in October 2018, previously was -2.5 done by gynecologist in 2016 T score at the spine in 06/2019 was -3.1 and declined 4.6%  In her youth she was 5 feet 7-1/2 inches tall she has lost 1 inch No history of fractures.  She had been recommended Fosamax but she did not want to do this because of fear of side effects.   She previously had been taking Evista since 5/09.  This was restarted on her visit in 05/2018 Subsequently stopped this because of cost  She has been recommended RECLAST: She had the infusion in 07/2019 without any side effect  Also on calcium  with  vitamin D but not vitamin D separately  Lab Results  Component Value Date   VD25OH 30.62 07/25/2020   VD25OH 20.40 (L) 12/28/2019   VD25OH 31.84 06/18/2018   VD25OH 25.66 (L) 11/22/2017     HYPOTHYROIDISM: This was first diagnosed in 1978 after I-131 treatment for Graves' disease          Her dosages have been adjusted periodically  In 3/19 her TSH was relatively low the dose was reduced She is since then taking 75 mcg daily  She does have some fatigue but also has had insomnia   TSH which was consistently normal is now unusually high at 11.3  She is taking levothyroxine daily in the morning before breakfast as before without any vitamins and has not missed any doses    Lab Results  Component Value Date   TSH 11.27 (H) 07/25/2020   TSH 2.60 12/28/2019   TSH 2.01 06/24/2019   FREET4 0.81 07/25/2020   FREET4 1.31 12/28/2019   FREET4 1.14 06/24/2019   '  HYPERTENSION: She has been on treatment since 2009 and had previously been on Lotrel for quite some time with good control.   Blood  pressure was low normal in 11/2017 and her Lotrel was changed  With taking only lisinopril 5 mg daily blood pressure is controlled Does not check at home    BP Readings from Last 3 Encounters:  08/01/20 132/66  12/31/19 138/70  07/02/19 110/60     Allergies as of 08/01/2020      Reactions   Hydrocodone-acetaminophen    Patient passed out with this medication   Erythromycin Base Other (See Comments)   Other      Medication List       Accurate as of August 01, 2020  1:38 PM. If you have any questions, ask your nurse or doctor.        CALCIUM + D PO Take 1 tablet by mouth daily.   fish oil-omega-3 fatty acids 1000 MG capsule Take 1 g by mouth daily.   gabapentin 100 MG capsule Commonly known as: NEURONTIN Take 100 mg by mouth as needed.   levothyroxine 75 MCG tablet Commonly known as: SYNTHROID TAKE 1 TABLET BY MOUTH EVERY DAY   lisinopril 5 MG tablet Commonly known as: ZESTRIL TAKE 1 TABLET BY MOUTH EVERY DAY   meloxicam 7.5 MG tablet Commonly known as: MOBIC TAKE 1 TABLET (7.5 MG TOTAL) BY MOUTH DAILY. What changed:   when to take this  reasons  to take this   VITAMIN E PO Take 1 tablet by mouth daily.       Past Medical History:  Diagnosis Date  . Breast cancer ([place]) 1997  . Depression   . GERD (gastroesophageal reflux disease)   . Graves disease   . Hemorrhoids   . Hypertension   . Migraines   . Osteoporosis   . Personal history of radiation therapy 1998  . Vitamin D deficiency     Past Surgical History:  Procedure Laterality Date  . BREAST BIOPSY Right 08/11/1996  . BREAST LUMPECTOMY Right 1998  . CESAREAN SECTION    . CHOLECYSTECTOMY    . SPINE SURGERY      Right breast cancer treated with lumpectomy and radiation 2000, lumbar spine surgery     Family History  Problem Relation Age of Onset  . Heart disease Father   . Pulmonary fibrosis Father   . Hypertension Father   . Pancreatic cancer Sister   . Sleep apnea Son   .  Diabetes Maternal Aunt     Social History:  reports that she has been smoking cigarettes. She has a 12.00 pack-year smoking history. She has never used smokeless tobacco. She reports current alcohol use. She reports that she does not use drugs.  Allergies:  Allergies  Allergen Reactions  . Hydrocodone-Acetaminophen     Patient passed out with this medication  . Erythromycin Base Other (See Comments)    Other    REVIEW of systems:  She was down to 102 lbs   Wt Readings from Last 3 Encounters:  08/01/20 108 lb 3.2 oz (49.1 kg)  12/31/19 119 lb 3.2 oz (54.1 kg)  07/02/19 123 lb 9.6 oz (56.1 kg)     Hyperlipidemia: She has a persistently mild increase in LDL, unchanged Does not have any other risk factors LDL consistently below 130   Lab Results  Component Value Date   CHOL 188 12/28/2019   CHOL 187 06/24/2019   CHOL 202 (H) 12/24/2018   Lab Results  Component Value Date   HDL 46.60 12/28/2019   HDL 48.20 06/24/2019   HDL 50.70 12/24/2018   Lab Results  Component Value Date   LDLCALC 124 (H) 12/28/2019   LDLCALC 122 (H) 06/24/2019   LDLCALC 128 (H) 12/24/2018   Lab Results  Component Value Date   TRIG 90.0 12/28/2019   TRIG 84.0 06/24/2019   TRIG 116.0 12/24/2018   Lab Results  Component Value Date   CHOLHDL 4 12/28/2019   CHOLHDL 4 06/24/2019   CHOLHDL 4 12/24/2018   No results found for: LDLDIRECT    She is now taking Remeron 7.5 mg daily for reportedly depression and insomnia  Has anemia of unclear etiology, has not been advised any treatment from PCP  Lab Results  Component Value Date   HGB 11.3 (L) 07/25/2020          Examination:   BP 132/66   Pulse 95   Ht 5\' 7"  (1.702 m)   Wt 108 lb 3.2 oz (49.1 kg)   SpO2 98%   BMI 16.95 kg/m       Assessments / PLAN:   Osteoporosis:   Has needed Reclast for any worsening T score of -3.1 at the spine in 2020 She tolerated this well She will need this again this month and will try to  schedule  Her vitamin D level is low normal even with taking vitamin D supplement in her calcium tablet but because of her  osteoporosis needs to start the vitamin D3 separately, 2000 units as prescribed   HYPERTENSION: The blood pressure is again well controlled with lisinopril 5 mg daily  She will continue the same dose   HYPOTHYROIDISM, post ablative, long-standing Her weight has fluctuated but not clear why her TSH is unusually high despite her weight loss She has not missed any doses  She will start taking 88 mcg now  Anemia: Advised her to follow-up with PCP, will also check B12 on the next visit because of history of autoimmune thyroid disease  Follow-up in 2 months    Justyce Baby 08/01/2020, 1:38 PM

## 2020-08-01 NOTE — Patient Instructions (Signed)
Start Vitamin D3 daily

## 2020-08-16 ENCOUNTER — Telehealth: Payer: Self-pay | Admitting: Nutrition

## 2020-08-16 NOTE — Telephone Encounter (Signed)
Appointment scheduled for [facility]. The [facility]th.

## 2020-08-18 ENCOUNTER — Telehealth: Payer: Self-pay | Admitting: Dietician

## 2020-08-22 ENCOUNTER — Telehealth: Payer: Self-pay | Admitting: Endocrinology

## 2020-08-22 NOTE — Telephone Encounter (Signed)
Please advise 

## 2020-08-22 NOTE — Telephone Encounter (Signed)
Patient called to find out what dosage of Reclast she is receiving on 08/31/20.  She needs to verify the dosing information with her insurance company.  Please call patient at  [phone] to clarify

## 2020-08-23 NOTE — Telephone Encounter (Signed)
Patient had a couple more questions about the reclast - Bonita Quin if you could give her a call to discuss. Ph# [phone]

## 2020-08-23 NOTE — Telephone Encounter (Signed)
5 g

## 2020-08-24 ENCOUNTER — Other Ambulatory Visit: Payer: Self-pay | Admitting: Endocrinology

## 2020-08-24 NOTE — Telephone Encounter (Signed)
Patient called asking what her copay would be when she comes for the reclast? Please advise. Ph# [phone]

## 2020-08-24 NOTE — Telephone Encounter (Signed)
Questions answered about the copay and if she needed to be fasting.  She had no final quesitons.

## 2020-08-24 NOTE — Telephone Encounter (Signed)
Patient told that she will be taking 5 mg. Of Risdronate.  She voice understanding of this and had no final questions.

## 2020-08-31 ENCOUNTER — Other Ambulatory Visit: Payer: Self-pay

## 2020-08-31 ENCOUNTER — Ambulatory Visit (INDEPENDENT_AMBULATORY_CARE_PROVIDER_SITE_OTHER): Payer: Medicare HMO | Admitting: Nutrition

## 2020-08-31 DIAGNOSIS — M810 Age-related osteoporosis without current pathological fracture: Secondary | ICD-10-CM | POA: Diagnosis not present

## 2020-09-01 DIAGNOSIS — H26492 Other secondary cataract, left eye: Secondary | ICD-10-CM | POA: Diagnosis not present

## 2020-09-05 NOTE — Progress Notes (Signed)
Patient was identified by name and DOB.  After viewing Dr, Ronnie Derby note on 08/01/20, and after patient signed the consent, and IV was started in her right arm.  2ccs of Normal saline was infused and site showed no signs of infiltration.  5mg . Of zolendronic acid was infused beginning at 10:05 AM and infused until 10:35.  Patient denied discomfort or dizziness.  The site was flushed with normal saline and the IV was discontinued.  The site showed no signs of redness or swelling.  She was encouraged to continue to calcium and Vit. D per Dr. Ronnie Derby instructions, and to drink 6  8-ounce glasses of water today  She agreed to do this and had no final questions.

## 2020-09-05 NOTE — Patient Instructions (Addendum)
Continue to take calcium and vit.D per Dr. Ronnie Derby instruction Drink 6  8 ounce glasses of water today.

## 2020-09-08 ENCOUNTER — Other Ambulatory Visit: Payer: Self-pay

## 2020-09-12 ENCOUNTER — Other Ambulatory Visit: Payer: Medicare HMO

## 2020-09-13 ENCOUNTER — Other Ambulatory Visit: Payer: Medicare HMO

## 2020-09-19 ENCOUNTER — Ambulatory Visit: Payer: Medicare HMO | Admitting: Endocrinology

## 2020-09-21 ENCOUNTER — Other Ambulatory Visit (INDEPENDENT_AMBULATORY_CARE_PROVIDER_SITE_OTHER): Payer: Medicare HMO

## 2020-09-21 ENCOUNTER — Other Ambulatory Visit: Payer: Self-pay

## 2020-09-21 DIAGNOSIS — D649 Anemia, unspecified: Secondary | ICD-10-CM

## 2020-09-21 DIAGNOSIS — E890 Postprocedural hypothyroidism: Secondary | ICD-10-CM | POA: Diagnosis not present

## 2020-09-21 LAB — TSH: TSH: 0.86 u[IU]/mL (ref 0.35–4.50)

## 2020-09-21 LAB — VITAMIN B12: Vitamin B-12: 985 pg/mL — ABNORMAL HIGH (ref 211–911)

## 2020-09-21 LAB — T4, FREE: Free T4: 1.39 ng/dL (ref 0.60–1.60)

## 2020-09-29 ENCOUNTER — Encounter: Payer: Self-pay | Admitting: Endocrinology

## 2020-09-29 ENCOUNTER — Ambulatory Visit: Payer: Medicare HMO | Admitting: Endocrinology

## 2020-09-29 ENCOUNTER — Other Ambulatory Visit: Payer: Self-pay

## 2020-09-29 VITALS — BP 130/66 | HR 62 | Ht 67.0 in | Wt 108.6 lb

## 2020-09-29 DIAGNOSIS — E559 Vitamin D deficiency, unspecified: Secondary | ICD-10-CM | POA: Diagnosis not present

## 2020-09-29 DIAGNOSIS — E890 Postprocedural hypothyroidism: Secondary | ICD-10-CM

## 2020-09-29 DIAGNOSIS — I10 Essential (primary) hypertension: Secondary | ICD-10-CM

## 2020-09-29 DIAGNOSIS — M810 Age-related osteoporosis without current pathological fracture: Secondary | ICD-10-CM | POA: Diagnosis not present

## 2020-09-29 NOTE — Progress Notes (Signed)
Patient ID: Victoria Blair, female   DOB: 1945-11-23, 75 y.o.   MRN: [id number]    Reason for Appointment:  Follow-up visit for various problems   History of Present Illness:    OSTEOPOROSIS; previously was given Actonel but stopped this because of difficulty with compliance.  T score at spine was -2.8 in October 2018, previously was -2.5 done by gynecologist in 2016 T score at the spine in 06/2019 was -3.1 and declined 4.6%  In her youth she was 5 feet 7-1/2 inches tall she has lost 1 inch No history of fractures.  She had been recommended Fosamax but she did not want to do this because of fear of side effects.   She previously had been taking Evista since 5/09.  This was restarted on her visit in 05/2018 Subsequently stopped this because of cost  She has been treated with RECLAST: She had the first infusion in 07/2019 without any side effect She had a repeat infusion of 5 g Reclast on 08/31/2020  Also on calcium  with  vitamin D  Now taking vitamin D separately, probably 2000 Units for her vitamin D deficiency  Lab Results  Component Value Date   VD25OH 30.62 07/25/2020   VD25OH 20.40 (L) 12/28/2019   VD25OH 31.84 06/18/2018   VD25OH 25.66 (L) 11/22/2017     HYPOTHYROIDISM: This was first diagnosed in 1978 after I-131 treatment for Graves' disease          Her dosages have been adjusted periodically  In 3/19 her TSH was relatively low the dose was reduced but in 06/2020 her TSH was high at 11.3 She is since then taking 88 mcg daily  She does have more energy after increasing her levothyroxine on her last visit She has difficulty gaining weight No symptoms of shakiness or anxiety/heat intolerance  TSH which was 11.3 is now back to 0.9   She is taking levothyroxine daily in the morning before breakfast as before without any vitamins and has not missed any doses    Lab Results  Component Value Date   TSH 0.86 09/21/2020   TSH 11.27 (H) 07/25/2020    TSH 2.60 12/28/2019   FREET4 1.39 09/21/2020   FREET4 0.81 07/25/2020   FREET4 1.31 12/28/2019   '  HYPERTENSION: She has been on treatment since 2009 and had previously been on Lotrel for quite some time with good control.   Blood pressure was low normal in 11/2017 and her Lotrel was stopped  With taking only lisinopril 5 mg daily blood pressure is controlled Does not check at home    BP Readings from Last 3 Encounters:  09/29/20 130/66  08/01/20 132/66  12/31/19 138/70     Allergies as of 09/29/2020      Reactions   Hydrocodone-acetaminophen    Patient passed out with this medication   Erythromycin Base Other (See Comments)   Other      Medication List       Accurate as of September 29, 2020 11:15 AM. If you have any questions, ask your nurse or doctor.        CALCIUM + D PO Take 1 tablet by mouth daily.   fish oil-omega-3 fatty acids 1000 MG capsule Take 1 g by mouth daily.   gabapentin 100 MG capsule Commonly known as: NEURONTIN Take 100 mg by mouth as needed.   levothyroxine 88 MCG tablet Commonly known as: SYNTHROID Take 1 tablet (88 mcg total) by mouth daily.  levothyroxine 75 MCG tablet Commonly known as: SYNTHROID TAKE 1 TABLET BY MOUTH EVERY DAY   lisinopril 5 MG tablet Commonly known as: ZESTRIL TAKE 1 TABLET BY MOUTH EVERY DAY   meloxicam 7.5 MG tablet Commonly known as: MOBIC TAKE 1 TABLET (7.5 MG TOTAL) BY MOUTH DAILY.   mirtazapine 15 MG tablet Commonly known as: REMERON Take 15 mg by mouth at bedtime.   VITAMIN E PO Take 1 tablet by mouth daily.       Past Medical History:  Diagnosis Date  . Breast cancer ([place]) 1997  . Depression   . GERD (gastroesophageal reflux disease)   . Graves disease   . Hemorrhoids   . Hypertension   . Migraines   . Osteoporosis   . Personal history of radiation therapy 1998  . Vitamin D deficiency     Past Surgical History:  Procedure Laterality Date  . BREAST BIOPSY Right 08/11/1996  .  BREAST LUMPECTOMY Right 1998  . CESAREAN SECTION    . CHOLECYSTECTOMY    . SPINE SURGERY      Right breast cancer treated with lumpectomy and radiation 2000, lumbar spine surgery     Family History  Problem Relation Age of Onset  . Heart disease Father   . Pulmonary fibrosis Father   . Hypertension Father   . Pancreatic cancer Sister   . Sleep apnea Son   . Diabetes Maternal Aunt     Social History:  reports that she has been smoking cigarettes. She has a 12.00 pack-year smoking history. She has never used smokeless tobacco. She reports current alcohol use. She reports that she does not use drugs.  Allergies:  Allergies  Allergen Reactions  . Hydrocodone-Acetaminophen     Patient passed out with this medication  . Erythromycin Base Other (See Comments)    Other    REVIEW of systems:  Weight history:   Wt Readings from Last 3 Encounters:  09/29/20 108 lb 9.6 oz (49.3 kg)  08/01/20 108 lb 3.2 oz (49.1 kg)  12/31/19 119 lb 3.2 oz (54.1 kg)     Hyperlipidemia: She has a persistently mild increase in LDL, unchanged Does not have any other risk factors LDL consistently below 130   Lab Results  Component Value Date   CHOL 188 12/28/2019   CHOL 187 06/24/2019   CHOL 202 (H) 12/24/2018   Lab Results  Component Value Date   HDL 46.60 12/28/2019   HDL 48.20 06/24/2019   HDL 50.70 12/24/2018   Lab Results  Component Value Date   LDLCALC 124 (H) 12/28/2019   LDLCALC 122 (H) 06/24/2019   LDLCALC 128 (H) 12/24/2018   Lab Results  Component Value Date   TRIG 90.0 12/28/2019   TRIG 84.0 06/24/2019   TRIG 116.0 12/24/2018   Lab Results  Component Value Date   CHOLHDL 4 12/28/2019   CHOLHDL 4 06/24/2019   CHOLHDL 4 12/24/2018   No results found for: LDLDIRECT    She is now taking Remeron 7.5 mg, 1/2  daily for reportedly depression and insomnia  Has anemia of unclear etiology, has not been advised any treatment from PCP  Lab Results  Component Value  Date   HGB 11.3 (L) 07/25/2020          Examination:   BP 130/66   Pulse 62   Ht 5\' 7"  (1.702 m)   Wt 108 lb 9.6 oz (49.3 kg)   SpO2 99%   BMI 17.01 kg/m  Assessments / PLAN:   Osteoporosis:   Has been treated with Reclast twice for  T score of -3.1 at the spine in 2020 She tolerated this well Bone density to be rechecked in 11/22  Her vitamin D level is which was low is back to normal with 2000 units vitamin D3 and she will continue   HYPERTENSION: The blood pressure is again well controlled with lisinopril 5 mg daily  She will continue the same dose   HYPOTHYROIDISM, post ablative, long-standing She is subjectively doing better and TSH is back to normal with 88 mcg levothyroxine and she will continue  Follow-up in 6 months   Grethel Zenk 09/29/2020, 11:15 AM

## 2020-10-06 DIAGNOSIS — H9202 Otalgia, left ear: Secondary | ICD-10-CM | POA: Diagnosis not present

## 2020-10-06 DIAGNOSIS — H6123 Impacted cerumen, bilateral: Secondary | ICD-10-CM | POA: Diagnosis not present

## 2020-10-07 DIAGNOSIS — H35372 Puckering of macula, left eye: Secondary | ICD-10-CM | POA: Diagnosis not present

## 2020-10-07 DIAGNOSIS — H35453 Secondary pigmentary degeneration, bilateral: Secondary | ICD-10-CM | POA: Diagnosis not present

## 2020-10-07 DIAGNOSIS — H25813 Combined forms of age-related cataract, bilateral: Secondary | ICD-10-CM | POA: Diagnosis not present

## 2020-10-07 DIAGNOSIS — H353131 Nonexudative age-related macular degeneration, bilateral, early dry stage: Secondary | ICD-10-CM | POA: Diagnosis not present

## 2020-10-07 DIAGNOSIS — H35363 Drusen (degenerative) of macula, bilateral: Secondary | ICD-10-CM | POA: Diagnosis not present

## 2020-10-07 DIAGNOSIS — Z961 Presence of intraocular lens: Secondary | ICD-10-CM | POA: Diagnosis not present

## 2020-10-13 ENCOUNTER — Encounter: Payer: Self-pay | Admitting: Endocrinology

## 2020-10-28 DIAGNOSIS — B373 Candidiasis of vulva and vagina: Secondary | ICD-10-CM | POA: Diagnosis not present

## 2020-11-11 ENCOUNTER — Other Ambulatory Visit: Payer: Self-pay | Admitting: Endocrinology

## 2020-11-20 DIAGNOSIS — E039 Hypothyroidism, unspecified: Secondary | ICD-10-CM | POA: Diagnosis not present

## 2020-11-20 DIAGNOSIS — I10 Essential (primary) hypertension: Secondary | ICD-10-CM | POA: Diagnosis not present

## 2020-11-20 DIAGNOSIS — J438 Other emphysema: Secondary | ICD-10-CM | POA: Diagnosis not present

## 2020-11-20 DIAGNOSIS — K219 Gastro-esophageal reflux disease without esophagitis: Secondary | ICD-10-CM | POA: Diagnosis not present

## 2020-11-20 DIAGNOSIS — D509 Iron deficiency anemia, unspecified: Secondary | ICD-10-CM | POA: Diagnosis not present

## 2020-11-20 DIAGNOSIS — M810 Age-related osteoporosis without current pathological fracture: Secondary | ICD-10-CM | POA: Diagnosis not present

## 2020-11-20 DIAGNOSIS — E7800 Pure hypercholesterolemia, unspecified: Secondary | ICD-10-CM | POA: Diagnosis not present

## 2020-11-20 DIAGNOSIS — E890 Postprocedural hypothyroidism: Secondary | ICD-10-CM | POA: Diagnosis not present

## 2020-11-24 DIAGNOSIS — H6123 Impacted cerumen, bilateral: Secondary | ICD-10-CM | POA: Diagnosis not present

## 2020-12-23 DIAGNOSIS — H6123 Impacted cerumen, bilateral: Secondary | ICD-10-CM | POA: Diagnosis not present

## 2021-01-18 DIAGNOSIS — E7800 Pure hypercholesterolemia, unspecified: Secondary | ICD-10-CM | POA: Diagnosis not present

## 2021-01-18 DIAGNOSIS — E890 Postprocedural hypothyroidism: Secondary | ICD-10-CM | POA: Diagnosis not present

## 2021-01-18 DIAGNOSIS — E039 Hypothyroidism, unspecified: Secondary | ICD-10-CM | POA: Diagnosis not present

## 2021-01-18 DIAGNOSIS — D509 Iron deficiency anemia, unspecified: Secondary | ICD-10-CM | POA: Diagnosis not present

## 2021-01-18 DIAGNOSIS — I10 Essential (primary) hypertension: Secondary | ICD-10-CM | POA: Diagnosis not present

## 2021-01-18 DIAGNOSIS — M810 Age-related osteoporosis without current pathological fracture: Secondary | ICD-10-CM | POA: Diagnosis not present

## 2021-01-18 DIAGNOSIS — J438 Other emphysema: Secondary | ICD-10-CM | POA: Diagnosis not present

## 2021-01-18 DIAGNOSIS — K219 Gastro-esophageal reflux disease without esophagitis: Secondary | ICD-10-CM | POA: Diagnosis not present

## 2021-01-21 DIAGNOSIS — S30860A Insect bite (nonvenomous) of lower back and pelvis, initial encounter: Secondary | ICD-10-CM | POA: Diagnosis not present

## 2021-01-21 DIAGNOSIS — W57XXXA Bitten or stung by nonvenomous insect and other nonvenomous arthropods, initial encounter: Secondary | ICD-10-CM | POA: Diagnosis not present

## 2021-02-27 DIAGNOSIS — H6093 Unspecified otitis externa, bilateral: Secondary | ICD-10-CM | POA: Diagnosis not present

## 2021-03-06 ENCOUNTER — Other Ambulatory Visit: Payer: Self-pay | Admitting: Family Medicine

## 2021-03-06 DIAGNOSIS — Z853 Personal history of malignant neoplasm of breast: Secondary | ICD-10-CM

## 2021-03-10 DIAGNOSIS — R200 Anesthesia of skin: Secondary | ICD-10-CM | POA: Diagnosis not present

## 2021-03-13 DIAGNOSIS — R202 Paresthesia of skin: Secondary | ICD-10-CM | POA: Diagnosis not present

## 2021-03-24 DIAGNOSIS — J438 Other emphysema: Secondary | ICD-10-CM | POA: Diagnosis not present

## 2021-03-24 DIAGNOSIS — E039 Hypothyroidism, unspecified: Secondary | ICD-10-CM | POA: Diagnosis not present

## 2021-03-24 DIAGNOSIS — K219 Gastro-esophageal reflux disease without esophagitis: Secondary | ICD-10-CM | POA: Diagnosis not present

## 2021-03-24 DIAGNOSIS — E890 Postprocedural hypothyroidism: Secondary | ICD-10-CM | POA: Diagnosis not present

## 2021-03-24 DIAGNOSIS — M810 Age-related osteoporosis without current pathological fracture: Secondary | ICD-10-CM | POA: Diagnosis not present

## 2021-03-24 DIAGNOSIS — D509 Iron deficiency anemia, unspecified: Secondary | ICD-10-CM | POA: Diagnosis not present

## 2021-03-24 DIAGNOSIS — E7800 Pure hypercholesterolemia, unspecified: Secondary | ICD-10-CM | POA: Diagnosis not present

## 2021-03-24 DIAGNOSIS — I10 Essential (primary) hypertension: Secondary | ICD-10-CM | POA: Diagnosis not present

## 2021-03-27 ENCOUNTER — Other Ambulatory Visit (INDEPENDENT_AMBULATORY_CARE_PROVIDER_SITE_OTHER): Payer: Medicare HMO

## 2021-03-27 ENCOUNTER — Other Ambulatory Visit: Payer: Self-pay

## 2021-03-27 DIAGNOSIS — I10 Essential (primary) hypertension: Secondary | ICD-10-CM | POA: Diagnosis not present

## 2021-03-27 DIAGNOSIS — E890 Postprocedural hypothyroidism: Secondary | ICD-10-CM | POA: Diagnosis not present

## 2021-03-27 DIAGNOSIS — E559 Vitamin D deficiency, unspecified: Secondary | ICD-10-CM

## 2021-03-27 LAB — T4, FREE: Free T4: 0.87 ng/dL (ref 0.60–1.60)

## 2021-03-27 LAB — BASIC METABOLIC PANEL
BUN: 24 mg/dL — ABNORMAL HIGH (ref 6–23)
CO2: 29 mEq/L (ref 19–32)
Calcium: 9.3 mg/dL (ref 8.4–10.5)
Chloride: 103 mEq/L (ref 96–112)
Creatinine, Ser: 0.89 mg/dL (ref 0.40–1.20)
GFR: 64.47 mL/min (ref >60.00)
Glucose, Bld: 105 mg/dL — ABNORMAL HIGH (ref 70–99)
Potassium: 4.4 mEq/L (ref 3.5–5.1)
Sodium: 138 mEq/L (ref 135–145)

## 2021-03-27 LAB — VITAMIN D 25 HYDROXY (VIT D DEFICIENCY, FRACTURES): VITD: 48.76 ng/mL (ref 30.00–100.00)

## 2021-03-27 LAB — TSH: TSH: 8.23 u[IU]/mL — ABNORMAL HIGH (ref 0.35–5.50)

## 2021-03-30 ENCOUNTER — Ambulatory Visit (INDEPENDENT_AMBULATORY_CARE_PROVIDER_SITE_OTHER): Payer: Medicare HMO | Admitting: Endocrinology

## 2021-03-30 ENCOUNTER — Other Ambulatory Visit: Payer: Self-pay

## 2021-03-30 ENCOUNTER — Encounter: Payer: Self-pay | Admitting: Endocrinology

## 2021-03-30 VITALS — BP 140/68 | HR 73 | Ht 67.0 in | Wt 103.0 lb

## 2021-03-30 DIAGNOSIS — I10 Essential (primary) hypertension: Secondary | ICD-10-CM

## 2021-03-30 DIAGNOSIS — E559 Vitamin D deficiency, unspecified: Secondary | ICD-10-CM

## 2021-03-30 DIAGNOSIS — E890 Postprocedural hypothyroidism: Secondary | ICD-10-CM | POA: Diagnosis not present

## 2021-03-30 NOTE — Patient Instructions (Addendum)
Take 2 thyroid pills on Fridays, 1 on other days  Call when refill needed

## 2021-03-30 NOTE — Progress Notes (Addendum)
Patient ID: Victoria Blair, female   DOB: September 07, 1945, 75 y.o.   MRN: [id number]    Reason for Appointment:  Follow-up visit for various problems   History of Present Illness:    OSTEOPOROSIS; previously was given Actonel but stopped this because of difficulty with compliance.  T score at spine was -2.8 in October 2018, previously was -2.5 done by gynecologist in 2016 T score at the spine in 06/2019 was -3.1 and declined 4.6%  In her youth she was 5 feet 7-1/2 inches tall she has lost 1 inch No history of fractures.  She had been recommended Fosamax but she did not want to do this because of fear of side effects.   She previously had been taking Evista since 5/09.  This was restarted on her visit in 05/2018 Subsequently stopped this because of cost  She has been treated with RECLAST: She had the first infusion in 07/2019 without any side effect She had a repeat infusion of 5 g Reclast on 08/31/2020  Also on calcium  with  vitamin D  She is still taking vitamin D separately,?  2000 Units for her vitamin D deficiency  Lab Results  Component Value Date   VD25OH 48.76 03/27/2021   VD25OH 30.62 07/25/2020   VD25OH 20.40 (L) 12/28/2019   VD25OH 31.84 06/18/2018     HYPOTHYROIDISM: This was first diagnosed in 1978 after I-131 treatment for Graves' disease          Her dosages have been adjusted periodically  In 3/19 her TSH was relatively low the dose was reduced but in 06/2020 her TSH was high at 11.3 She is since then taking 88 mcg daily  She has difficulty gaining weight Currently complaining of more fatigue lately but also has had more stress  TSH is unusually high  She is taking levothyroxine daily in the morning before breakfast as before without any vitamins and has not missed any doses    Lab Results  Component Value Date   TSH 8.23 (H) 03/27/2021   TSH 0.86 09/21/2020   TSH 11.27 (H) 07/25/2020   FREET4 0.87 03/27/2021   FREET4 1.39  09/21/2020   FREET4 0.81 07/25/2020   '  HYPERTENSION: She has been on treatment since 2009 and had previously been on Lotrel for quite some time with good control.   Blood pressure was low normal in 11/2017 and her Lotrel was stopped  With taking only lisinopril 5 mg daily blood pressure is controlled Does not check at home    BP Readings from Last 3 Encounters:  03/30/21 140/68  09/29/20 130/66  08/01/20 132/66     Allergies as of 03/30/2021       Reactions   Hydrocodone-acetaminophen    Patient passed out with this medication   Erythromycin Base Other (See Comments)   Other        Medication List        Accurate as of March 30, 2021 11:14 AM. If you have any questions, ask your nurse or doctor.          CALCIUM + D PO Take 1 tablet by mouth daily.   fish oil-omega-3 fatty acids 1000 MG capsule Take 1 g by mouth daily.   levothyroxine 88 MCG tablet Commonly known as: SYNTHROID TAKE 1 TABLET BY MOUTH EVERY DAY   lisinopril 5 MG tablet Commonly known as: ZESTRIL TAKE 1 TABLET BY MOUTH EVERY DAY  meloxicam 7.5 MG tablet Commonly known as: MOBIC TAKE 1 TABLET (7.5 MG TOTAL) BY MOUTH DAILY.   mirtazapine 15 MG tablet Commonly known as: REMERON Take 15 mg by mouth at bedtime.   VITAMIN E PO Take 1 tablet by mouth daily.        Past Medical History:  Diagnosis Date   Breast cancer ([place]) 1997   Depression    GERD (gastroesophageal reflux disease)    Graves disease    Hemorrhoids    Hypertension    Migraines    Osteoporosis    Personal history of radiation therapy 1998   Vitamin D deficiency     Past Surgical History:  Procedure Laterality Date   BREAST BIOPSY Right 08/11/1996   BREAST LUMPECTOMY Right 1998   CESAREAN SECTION     CHOLECYSTECTOMY     SPINE SURGERY      Right breast cancer treated with lumpectomy and radiation 2000, lumbar spine surgery     Family History  Problem Relation Age of Onset   Heart disease Father     Pulmonary fibrosis Father    Hypertension Father    Pancreatic cancer Sister    Sleep apnea Son    Diabetes Maternal Aunt     Social History:  reports that she has been smoking cigarettes. She has a 12.00 pack-year smoking history. She has never used smokeless tobacco. She reports current alcohol use. She reports that she does not use drugs.  Allergies:  Allergies  Allergen Reactions   Hydrocodone-Acetaminophen     Patient passed out with this medication   Erythromycin Base Other (See Comments)    Other    REVIEW of systems:  Weight history:   Wt Readings from Last 3 Encounters:  03/30/21 103 lb (46.7 kg)  09/29/20 108 lb 9.6 oz (49.3 kg)  08/01/20 108 lb 3.2 oz (49.1 kg)     Hyperlipidemia: She has a persistently mild increase in LDL, unchanged Does not have any other risk factors LDL consistently below 130   Lab Results  Component Value Date   CHOL 188 12/28/2019   CHOL 187 06/24/2019   CHOL 202 (H) 12/24/2018   Lab Results  Component Value Date   HDL 46.60 12/28/2019   HDL 48.20 06/24/2019   HDL 50.70 12/24/2018   Lab Results  Component Value Date   LDLCALC 124 (H) 12/28/2019   LDLCALC 122 (H) 06/24/2019   LDLCALC 128 (H) 12/24/2018   Lab Results  Component Value Date   TRIG 90.0 12/28/2019   TRIG 84.0 06/24/2019   TRIG 116.0 12/24/2018   Lab Results  Component Value Date   CHOLHDL 4 12/28/2019   CHOLHDL 4 06/24/2019   CHOLHDL 4 12/24/2018   No results found for: LDLDIRECT    She is taking Remeron 7.5 mg, 1/2  daily for reportedly depression and insomnia  Has anemia of unclear etiology, has not been advised any treatment from PCP  Lab Results  Component Value Date   HGB 11.3 (L) 07/25/2020          Examination:   BP 140/68   Pulse 73   Ht '5\' 7"'$  (1.702 m)   Wt 103 lb (46.7 kg)   SpO2 98%   BMI 16.13 kg/m   Blood pressure checked twice Thyroid not palpable Biceps reflexes show normal relaxation No ankle edema     Assessments  / PLAN:   Osteoporosis:   Has been treated with Reclast twice for baseline T score of -3.1 at  the spine in 2020 She will be scheduled for follow-up in 1/23 Bone density to be rechecked in 11/22  Her vitamin D level is which was low is back to normal with vitamin D supplement   HYPERTENSION: The blood pressure is relatively higher but may be from recent stress  Will recheck in 2 months  She will continue the same dose   HYPOTHYROIDISM, post ablative, long-standing Although TSH was improved on the last visit and is now higher and she has some nonspecific fatigue  She will need to be moved up to 100 mcg instead of 88 Currently since she has a large supply of the 88 she can take 8 tablets a week and let us know when she is ready for new prescription Follow-up in 2 months  Weight loss: This is likely to be from her stress and skipping meals   Victoria Blair 03/30/2021, 11:14 AM

## 2021-04-11 ENCOUNTER — Other Ambulatory Visit: Payer: Self-pay | Admitting: Endocrinology

## 2021-04-11 NOTE — Telephone Encounter (Signed)
Patient is moving and in the process of moving has lost her Lisinopril and Levothyroxine.  She needs new RX sent to [facility] for both. Just took her last pills of each medication this morning.

## 2021-04-12 ENCOUNTER — Other Ambulatory Visit: Payer: Self-pay | Admitting: Endocrinology

## 2021-04-12 DIAGNOSIS — L309 Dermatitis, unspecified: Secondary | ICD-10-CM | POA: Diagnosis not present

## 2021-04-20 DIAGNOSIS — H6090 Unspecified otitis externa, unspecified ear: Secondary | ICD-10-CM | POA: Diagnosis not present

## 2021-04-20 DIAGNOSIS — H6122 Impacted cerumen, left ear: Secondary | ICD-10-CM | POA: Diagnosis not present

## 2021-04-27 ENCOUNTER — Other Ambulatory Visit: Payer: Self-pay

## 2021-04-27 ENCOUNTER — Ambulatory Visit
Admission: RE | Admit: 2021-04-27 | Discharge: 2021-04-27 | Disposition: A | Discharge status: Home / Self Care | Payer: Medicare HMO | Source: Ambulatory Visit | Attending: Family Medicine | Admitting: Family Medicine

## 2021-04-27 DIAGNOSIS — Z853 Personal history of malignant neoplasm of breast: Secondary | ICD-10-CM

## 2021-04-27 DIAGNOSIS — Z1231 Encounter for screening mammogram for malignant neoplasm of breast: Secondary | ICD-10-CM | POA: Diagnosis not present

## 2021-05-17 DIAGNOSIS — B009 Herpesviral infection, unspecified: Secondary | ICD-10-CM | POA: Diagnosis not present

## 2021-05-17 DIAGNOSIS — L820 Inflamed seborrheic keratosis: Secondary | ICD-10-CM | POA: Diagnosis not present

## 2021-05-22 ENCOUNTER — Other Ambulatory Visit (INDEPENDENT_AMBULATORY_CARE_PROVIDER_SITE_OTHER): Payer: Medicare HMO

## 2021-05-22 ENCOUNTER — Other Ambulatory Visit: Payer: Medicare HMO

## 2021-05-22 DIAGNOSIS — E890 Postprocedural hypothyroidism: Secondary | ICD-10-CM | POA: Diagnosis not present

## 2021-05-22 LAB — TSH: TSH: 0.88 u[IU]/mL (ref 0.35–5.50)

## 2021-05-22 LAB — T4, FREE: Free T4: 1.21 ng/dL (ref 0.60–1.60)

## 2021-05-25 ENCOUNTER — Telehealth: Payer: Self-pay | Admitting: Endocrinology

## 2021-05-25 ENCOUNTER — Ambulatory Visit (INDEPENDENT_AMBULATORY_CARE_PROVIDER_SITE_OTHER): Payer: Medicare HMO | Admitting: Endocrinology

## 2021-05-25 ENCOUNTER — Other Ambulatory Visit: Payer: Self-pay

## 2021-05-25 VITALS — BP 122/60 | HR 84 | Ht 67.0 in | Wt 105.6 lb

## 2021-05-25 DIAGNOSIS — E890 Postprocedural hypothyroidism: Secondary | ICD-10-CM | POA: Diagnosis not present

## 2021-05-25 DIAGNOSIS — I10 Essential (primary) hypertension: Secondary | ICD-10-CM

## 2021-05-25 DIAGNOSIS — E559 Vitamin D deficiency, unspecified: Secondary | ICD-10-CM

## 2021-05-25 DIAGNOSIS — M810 Age-related osteoporosis without current pathological fracture: Secondary | ICD-10-CM | POA: Diagnosis not present

## 2021-05-25 MED ORDER — LEVOTHYROXINE SODIUM 100 MCG PO TABS: 100.0000 ug | ORAL_TABLET | Freq: Every day | ORAL | 3 refills | Status: DC

## 2021-05-25 NOTE — Patient Instructions (Addendum)
Vitamin D 3,  2000 units daily  Schedule bone density

## 2021-05-25 NOTE — Progress Notes (Signed)
Patient ID: MAURITA HAVENER, female   DOB: 1946-03-25, 75 y.o.   MRN: [id number]    Reason for Appointment:  Follow-up visit for various problems   History of Present Illness:    OSTEOPOROSIS; previously was given Actonel but stopped this because of difficulty with compliance.  T score at spine was -2.8 in October 2018, previously was -2.5 done by gynecologist in 2016  T score at the spine in 06/2019 was -3.1 and declined 4.6%  In her youth she was 5 feet 7-1/2 inches tall she has lost 1 inch No history of fractures.  She had been recommended Fosamax but she did not want to do this because of fear of side effects.   She previously had been taking Evista since 5/09.  This was restarted on her visit in 05/2018 Subsequently stopped this because of cost  She has been treated with RECLAST: She had the first infusion in 07/2019 without any side effect She had a repeat infusion of 5 g Reclast on 08/31/2020  Vitamin D deficiency: She is bone on calcium  with  vitamin D  She did not think she is now taking vitamin D separately, previously recommended 2000 Units for her vitamin D deficiency  Lab Results  Component Value Date   VD25OH 48.76 03/27/2021   VD25OH 30.62 07/25/2020   VD25OH 20.40 (L) 12/28/2019   VD25OH 31.84 06/18/2018     HYPOTHYROIDISM: This was first diagnosed in 1978 after I-131 treatment for Graves' disease          Her dosages have been adjusted periodically  Her dose was adjusted last in 8/22 when her TSH was relatively high She is since then taking 88 mcg 8 days a week  She does have more energy after increasing her levothyroxine on her last visit She has difficulty gaining weight but she thinks she is eating better and using boost Overall feels fairly good  TSH back to normal  She is taking levothyroxine daily in the morning before breakfast as before without any vitamins and has not missed any doses    Lab Results  Component Value  Date   TSH 0.88 05/22/2021   TSH 8.23 (H) 03/27/2021   TSH 0.86 09/21/2020   FREET4 1.21 05/22/2021   FREET4 0.87 03/27/2021   FREET4 1.39 09/21/2020   '  HYPERTENSION: She has been on treatment since 2009 and had previously been on Lotrel for quite some time with good control.   Blood pressure was low normal in 11/2017 and her Lotrel was stopped  With taking lisinopril 5 mg daily blood pressure is controlled Does not check at home    BP Readings from Last 3 Encounters:  05/25/21 122/60  03/30/21 140/68  09/29/20 130/66     Allergies as of 05/25/2021       Reactions   Hydrocodone-acetaminophen    Patient passed out with this medication   Erythromycin Base Other (See Comments)   Other        Medication List        Accurate as of May 25, 2021 11:05 AM. If you have any questions, ask your nurse or doctor.          CALCIUM + D PO Take 1 tablet by mouth daily.   fish oil-omega-3 fatty acids 1000 MG capsule Take 1 g by mouth daily.   levothyroxine 88 MCG tablet Commonly known as: SYNTHROID TAKE 1 TABLET BY MOUTH  EVERY DAY   lisinopril 5 MG tablet Commonly known as: ZESTRIL TAKE 1 TABLET BY MOUTH EVERY DAY   meloxicam 7.5 MG tablet Commonly known as: MOBIC TAKE 1 TABLET (7.5 MG TOTAL) BY MOUTH DAILY.   mirtazapine 15 MG tablet Commonly known as: REMERON Take 15 mg by mouth at bedtime.   VITAMIN E PO Take 1 tablet by mouth daily.        Past Medical History:  Diagnosis Date   Breast cancer ([place]) 1997   Depression    GERD (gastroesophageal reflux disease)    Graves disease    Hemorrhoids    Hypertension    Migraines    Osteoporosis    Personal history of radiation therapy 1998   Vitamin D deficiency     Past Surgical History:  Procedure Laterality Date   BREAST BIOPSY Right 08/11/1996   BREAST LUMPECTOMY Right 1998   CESAREAN SECTION     CHOLECYSTECTOMY     SPINE SURGERY      Right breast cancer treated with lumpectomy and  radiation 2000, lumbar spine surgery     Family History  Problem Relation Age of Onset   Heart disease Father    Pulmonary fibrosis Father    Hypertension Father    Pancreatic cancer Sister    Sleep apnea Son    Diabetes Maternal Aunt     Social History:  reports that she has been smoking cigarettes. She has a 12.00 pack-year smoking history. She has never used smokeless tobacco. She reports current alcohol use. She reports that she does not use drugs.  Allergies:  Allergies  Allergen Reactions   Hydrocodone-Acetaminophen     Patient passed out with this medication   Erythromycin Base Other (See Comments)    Other    REVIEW of systems:  Weight history:   Wt Readings from Last 3 Encounters:  05/25/21 105 lb 9.6 oz (47.9 kg)  03/30/21 103 lb (46.7 kg)  09/29/20 108 lb 9.6 oz (49.3 kg)     Hyperlipidemia: She has a persistently mild increase in LDL, unchanged Does not have any other risk factors LDL consistently below 130   Lab Results  Component Value Date   CHOL 188 12/28/2019   CHOL 187 06/24/2019   CHOL 202 (H) 12/24/2018   Lab Results  Component Value Date   HDL 46.60 12/28/2019   HDL 48.20 06/24/2019   HDL 50.70 12/24/2018   Lab Results  Component Value Date   LDLCALC 124 (H) 12/28/2019   LDLCALC 122 (H) 06/24/2019   LDLCALC 128 (H) 12/24/2018   Lab Results  Component Value Date   TRIG 90.0 12/28/2019   TRIG 84.0 06/24/2019   TRIG 116.0 12/24/2018   Lab Results  Component Value Date   CHOLHDL 4 12/28/2019   CHOLHDL 4 06/24/2019   CHOLHDL 4 12/24/2018   No results found for: LDLDIRECT    She is taking Remeron 7.5 mg, 1/2  daily for reportedly depression and insomnia  Has anemia of unclear etiology, has not been advised any treatment from PCP  Lab Results  Component Value Date   HGB 11.3 (L) 07/25/2020          Examination:   BP 122/60   Pulse 84   Ht 5\' 7"  (1.702 m)   Wt 105 lb 9.6 oz (47.9 kg)   SpO2 99%   BMI 16.54 kg/m         Assessments / PLAN:   Osteoporosis:   Treated with Reclast annually  now  Referred for bone density in 11/22   HYPOTHYROIDISM, post ablative, long-standing Requiring higher doses of levothyroxine lately  With 8 tablets of 88 mcg/week her TSH is back to normal and she is subjectively doing better also She will now be switched to the 100 mcg daily which is about the same average dose  Follow-up in 6 months  Weight loss: Improving, likely has had less stress and also able to increase her caloric intake  Vitamin D deficiency: She does not think she is taking any vitamin D separately and discussed need to take 2000 units daily   Hypertension: Her blood pressure is better now, likely higher on the last visit from being stressed, to continue 5 mg lisinopril   Elayne Snare 05/25/2021, 11:05 AM

## 2021-05-25 NOTE — Telephone Encounter (Signed)
Rx sent to preferred pharmacy.

## 2021-05-25 NOTE — Telephone Encounter (Signed)
Patient called to advise that the new RX for the Levothyroxine 100 MCG needs to be sent to the [facility] on [address]

## 2021-05-29 DIAGNOSIS — R200 Anesthesia of skin: Secondary | ICD-10-CM | POA: Diagnosis not present

## 2021-06-08 ENCOUNTER — Telehealth: Payer: Self-pay | Admitting: Endocrinology

## 2021-06-08 NOTE — Telephone Encounter (Signed)
Patient states that she has had rash for about a month. No changes in medication. Patient went to walk in clinic and was given lotion to put on it but hasn't went away. Patient think it maybe from medication. Please advise

## 2021-06-08 NOTE — Telephone Encounter (Signed)
Pt having a small rash on back and asks if it's her thyroid or reaction from her medication. Not having throat swelling/sob. Pt would like advise. Pt contact [phone]

## 2021-06-09 DIAGNOSIS — L309 Dermatitis, unspecified: Secondary | ICD-10-CM | POA: Diagnosis not present

## 2021-06-12 NOTE — Telephone Encounter (Signed)
Patient states that she did reach out to PCP but side effects to the medication that they gave her was too much. She is using something over the counter and states the rash is getting better.

## 2021-06-14 DIAGNOSIS — H6692 Otitis media, unspecified, left ear: Secondary | ICD-10-CM | POA: Diagnosis not present

## 2021-06-19 DIAGNOSIS — L814 Other melanin hyperpigmentation: Secondary | ICD-10-CM | POA: Diagnosis not present

## 2021-06-19 DIAGNOSIS — D225 Melanocytic nevi of trunk: Secondary | ICD-10-CM | POA: Diagnosis not present

## 2021-06-19 DIAGNOSIS — Z23 Encounter for immunization: Secondary | ICD-10-CM | POA: Diagnosis not present

## 2021-06-19 DIAGNOSIS — L299 Pruritus, unspecified: Secondary | ICD-10-CM | POA: Diagnosis not present

## 2021-06-19 DIAGNOSIS — L821 Other seborrheic keratosis: Secondary | ICD-10-CM | POA: Diagnosis not present

## 2021-06-19 DIAGNOSIS — L578 Other skin changes due to chronic exposure to nonionizing radiation: Secondary | ICD-10-CM | POA: Diagnosis not present

## 2021-06-26 DIAGNOSIS — H35372 Puckering of macula, left eye: Secondary | ICD-10-CM | POA: Diagnosis not present

## 2021-06-26 DIAGNOSIS — H524 Presbyopia: Secondary | ICD-10-CM | POA: Diagnosis not present

## 2021-06-26 DIAGNOSIS — H43813 Vitreous degeneration, bilateral: Secondary | ICD-10-CM | POA: Diagnosis not present

## 2021-06-26 DIAGNOSIS — H2511 Age-related nuclear cataract, right eye: Secondary | ICD-10-CM | POA: Diagnosis not present

## 2021-07-13 ENCOUNTER — Ambulatory Visit (INDEPENDENT_AMBULATORY_CARE_PROVIDER_SITE_OTHER)
Admission: RE | Admit: 2021-07-13 | Discharge: 2021-07-13 | Disposition: A | Discharge status: Home / Self Care | Payer: Medicare HMO | Source: Ambulatory Visit | Attending: Endocrinology | Admitting: Endocrinology

## 2021-07-13 ENCOUNTER — Other Ambulatory Visit: Payer: Self-pay

## 2021-07-13 DIAGNOSIS — M810 Age-related osteoporosis without current pathological fracture: Secondary | ICD-10-CM

## 2021-07-17 NOTE — Progress Notes (Signed)
Please call regarding bone density: It has improved at the spine although down at the hip.  Plan to do Reclast again in January, please inform Leonia Reader

## 2021-08-10 DIAGNOSIS — Z131 Encounter for screening for diabetes mellitus: Secondary | ICD-10-CM | POA: Diagnosis not present

## 2021-08-10 DIAGNOSIS — R110 Nausea: Secondary | ICD-10-CM | POA: Diagnosis not present

## 2021-08-10 DIAGNOSIS — Z136 Encounter for screening for cardiovascular disorders: Secondary | ICD-10-CM | POA: Diagnosis not present

## 2021-08-10 DIAGNOSIS — Z0000 Encounter for general adult medical examination without abnormal findings: Secondary | ICD-10-CM | POA: Diagnosis not present

## 2021-08-10 DIAGNOSIS — Z13220 Encounter for screening for lipoid disorders: Secondary | ICD-10-CM | POA: Diagnosis not present

## 2021-08-29 ENCOUNTER — Ambulatory Visit: Payer: Medicare HMO | Attending: Neurosurgery

## 2021-08-29 ENCOUNTER — Other Ambulatory Visit: Payer: Self-pay

## 2021-08-29 DIAGNOSIS — M6281 Muscle weakness (generalized): Secondary | ICD-10-CM | POA: Diagnosis not present

## 2021-08-29 DIAGNOSIS — M25552 Pain in left hip: Secondary | ICD-10-CM | POA: Insufficient documentation

## 2021-08-29 DIAGNOSIS — R2681 Unsteadiness on feet: Secondary | ICD-10-CM | POA: Insufficient documentation

## 2021-08-29 NOTE — Patient Instructions (Signed)
Pt instructed on initial home exercises and to perform exercises daily for best results.

## 2021-08-29 NOTE — Therapy (Signed)
OUTPATIENT PHYSICAL THERAPY THORACOLUMBAR EVALUATION   Patient Name: WILHELMINE KROGSTAD MRN: [id number] DOB:05/14/1946, 76 y.o., female Today's Date: 08/29/2021   PT End of Session - 08/29/21 1455     Visit Number 1    Number of Visits 9    Date for PT Re-Evaluation 10/24/21    Authorization Type [employer] MCR    Authorization Time Period FOTO v6, v10    Progress Note Due on Visit 10    PT Start Time 1400    PT Stop Time 1450    PT Time Calculation (min) 50 min    Activity Tolerance Patient tolerated treatment well    Behavior During Therapy WFL for tasks assessed/performed             Past Medical History:  Diagnosis Date   Breast cancer ([place]) 1997   Depression    GERD (gastroesophageal reflux disease)    Graves disease    Hemorrhoids    Hypertension    Migraines    Osteoporosis    Personal history of radiation therapy 1998   Vitamin D deficiency    Past Surgical History:  Procedure Laterality Date   BREAST BIOPSY Right 08/11/1996   BREAST LUMPECTOMY Right 1998   CESAREAN SECTION     CHOLECYSTECTOMY     SPINE SURGERY     Patient Active Problem List   Diagnosis Date Noted   Lung nodule 12/31/2013   Tobacco abuse 12/31/2013   Unspecified essential hypertension 09/03/2013   Hypothyroidism following radioiodine therapy 05/26/2013   Pure hypercholesterolemia 05/26/2013   DEPRESSION 04/26/2007   Osteopenia 04/26/2007   BREAST CANCER, HX OF 04/26/2007    PCP: Orpah Melter, MD  REFERRING PROVIDER: Vallarie Mare, MD  REFERRING DIAG: Bilateral leg numbness   THERAPY DIAG:  Pain in left hip  Muscle weakness (generalized)  Unsteadiness on feet  ONSET DATE: 04/27/2008  SUBJECTIVE:                                                                                                                                                                                           SUBJECTIVE STATEMENT: Pt reports primary c/o chronic Lt hip pain beginning in 2009 when  she was injured while working at [employer]. She reports that she was handed very heavy bags while working there and was unable to carry the load. She reports her pain is worst when laying on her L side and adds that this position can also cause numbness after about 10-15 minutes. This inhibits her sleep, and she reports being woken a few times per night. She denies any falls, however, a recent bone density  test indicated osteoporosis of her Lt hip. She also reports a hx of breast cx, of which she is in remission. Additionally, she reports a hx of L4-L5 lumbar discectomy in 1998. She denies any saddle anesthesia, bowel/ bladder changes, nausea/ vomiting, or unexplained weight change. She also denies morning stiffness. PERTINENT HISTORY:  Osteoporosis  PAIN:  Are you having pain? Yes VAS scale: 6/10 Pain location: Lt posterior hip PAIN TYPE: dull and sharp Aggravating factors: Laying on Lt side Relieving factors: Positional change, ice packs  PRECAUTIONS: N/A  WEIGHT BEARING RESTRICTIONS No  FALLS:  Has patient fallen in last 6 months? No, Number of falls: 0  LIVING ENVIRONMENT: Lives with: lives with their family Lives in: House/apartment Stairs: Yes; External: 4 steps; Rail on both sides going up Has following equipment at home: Single point cane  OCCUPATION: Part-time, cleaning for medical office  PLOF: Independent  PATIENT GOALS: Return to sleeping without limitation   OBJECTIVE:   DIAGNOSTIC FINDINGS:  07/13/2021: DG Bone Density: Assessment: Patient has OSTEOPOROSIS according to the [facility] classification for osteoporosis (see below). Fracture risk: high  PATIENT SURVEYS:  FOTO 63%, projected 73% by visit 12  SCREENING FOR RED FLAGS: Bowel or bladder incontinence: No Cauda equina syndrome: No   COGNITION:  Overall cognitive status: Within functional limits for tasks assessed     SENSATION:  Light touch: Appears intact    MUSCLE LENGTH: Hamstring 90/90 test: WNL  BIL Thomas test: (-) BIL  POSTURE:  Forward head posture/ BIL rounded shoulders/ decreased lumbar lordosis  PALPATION: TTP about L gluteal region over sciatic nerve distribution.  LUMBAR AROM  AROM AROM  08/29/2021  Flexion WNL  Extension WNL  Right lateral flexion WNL  Left lateral flexion WNL  Right rotation WNL  Left rotation WNL   (Blank rows = not tested)  LE AROM:  AROM Right 08/29/2021 Left 08/29/2021  Hip flexion WNL WNL  Hip extension WNL WNL  Hip abduction WNL WNL  Hip adduction    Hip internal rotation WNL WNL  Hip external rotation WNL WNL  Knee flexion    Knee extension    Ankle dorsiflexion    Ankle plantarflexion    Ankle inversion    Ankle eversion     (Blank rows = not tested)  LE MMT:  MMT Right 08/29/2021 Left 08/29/2021  Hip flexion 4/5 3+/5  Hip extension 3/5 3/5  Hip abduction 4+/5 4+/5  Hip adduction    Hip internal rotation    Hip external rotation    Knee flexion    Knee extension    Ankle dorsiflexion    Ankle plantarflexion    Ankle inversion    Ankle eversion     (Blank rows = not tested)  LUMBAR SPECIAL TESTS:  Straight leg raise test: Negative, Slump test: Negative, Trendelenburg sign: Negative, and Thomas test: Negative Hip Scour test: (-) BIL FABER: (-) BIL Ober's: (-) BIL Piriformis test: (-) BIL  FUNCTIONAL TESTS:  Double Leg Squat: WNL SLS: 8sec on L, 4sec on R      TODAY'S TREATMENT  TREATMENT 08/29/2021:  Therapeutic Exercise: - Supine glute bridge with alternating marches (2x8) -Supine bicycles with YTB around feet 2x10 BIL  Manual Therapy: - N/A  Neuromuscular re-ed: - N/A  Therapeutic Activity: - Educated pt on evaluation findings, prognosis, POC  Self-care/Home Management: - N/A    PATIENT EDUCATION:  Education details: Pt educated on likely underlying pathophysiology behind her pain presentation, prognosis, POC, and HEP Person educated: Patient Education  method: Explanation and  Demonstration Education comprehension: verbalized understanding and returned demonstration   HOME EXERCISE PROGRAM: Supine bridge with alternating marching 3x10 daily Supine bicycles with YTB 3x10 daily *Create Medbridge HEP with these exercises*  ASSESSMENT:  CLINICAL IMPRESSION: Patient is a 76 y.o. F who was seen today for physical therapy evaluation and treatment for chronic Lt hip pain. Upon assessment, her primary impairments include globally weak BIL hip musculature with primary limitation in hip extension, TTP to L gluteal region over sciatic nerve distribution, and decreased lumbar lordosis. Ruling up mechanical hip pain resulting from hip weakness due to these findings and negative findings for lumbar involvement, piriformis syndrome, and intra-articular hip dysfunction. Patient will benefit from skilled PT to address above impairments and improve overall function.  REHAB POTENTIAL: Good  CLINICAL DECISION MAKING: Stable/uncomplicated  EVALUATION COMPLEXITY: Low   GOALS: Goals reviewed with patient? Yes  SHORT TERM GOALS:  STG Name Target Date Goal status  1 Pt will report understanding and adherence to her HEP in order to promote independence in the management of her primary impairments Baseline:  09/26/2021 INITIAL   LONG TERM GOALS:   LTG Name Target Date Goal status  1 Pt will report ability to sleep through the night without sleep disturbances due to pain in order to get a good night's rest. Baseline: 10/24/2021 INITIAL  2 Pt will achieve a FOTO score of 73% or greater in order to demonstrate functional improvement in regard to her hip pain. Baseline: 10/24/2021 INITIAL  3 Pt will achieve global BIL hip strength =/> 4+/5 in order to progress an independent LE strengthening regimen without limitation. Baseline: 10/24/2021 INITIAL   PLAN: PT FREQUENCY: 1x/week  PT DURATION: 8 weeks  PLANNED INTERVENTIONS: Therapeutic exercises, Therapeutic activity, Neuro  Muscular re-education, Balance training, Gait training, Patient/Family education, Joint mobilization, Aquatic Therapy, Spinal mobilization, Traction, and Manual therapy  PLAN FOR NEXT SESSION: Progress closed-chain hip strengthening.   Vanessa [place], PT, DPT 08/29/21 3:10 PM

## 2021-09-04 ENCOUNTER — Other Ambulatory Visit: Payer: Self-pay

## 2021-09-04 ENCOUNTER — Ambulatory Visit: Payer: Medicare HMO

## 2021-09-04 ENCOUNTER — Telehealth: Payer: Self-pay | Admitting: Nutrition

## 2021-09-04 ENCOUNTER — Other Ambulatory Visit (INDEPENDENT_AMBULATORY_CARE_PROVIDER_SITE_OTHER): Payer: Medicare HMO

## 2021-09-04 DIAGNOSIS — E890 Postprocedural hypothyroidism: Secondary | ICD-10-CM

## 2021-09-04 DIAGNOSIS — I10 Essential (primary) hypertension: Secondary | ICD-10-CM | POA: Diagnosis not present

## 2021-09-04 DIAGNOSIS — E559 Vitamin D deficiency, unspecified: Secondary | ICD-10-CM

## 2021-09-04 LAB — BASIC METABOLIC PANEL
BUN: 20 mg/dL (ref 6–23)
CO2: 31 mEq/L (ref 19–32)
Calcium: 9.7 mg/dL (ref 8.4–10.5)
Chloride: 102 mEq/L (ref 96–112)
Creatinine, Ser: 0.77 mg/dL (ref 0.40–1.20)
GFR: 76.47 mL/min (ref >60.00)
Glucose, Bld: 86 mg/dL (ref 70–99)
Potassium: 4.5 mEq/L (ref 3.5–5.1)
Sodium: 139 mEq/L (ref 135–145)

## 2021-09-04 LAB — T4, FREE: Free T4: 1.09 ng/dL (ref 0.60–1.60)

## 2021-09-04 LAB — VITAMIN D 25 HYDROXY (VIT D DEFICIENCY, FRACTURES): VITD: 30.65 ng/mL (ref 30.00–100.00)

## 2021-09-04 LAB — TSH: TSH: 2.17 u[IU]/mL (ref 0.35–5.50)

## 2021-09-04 NOTE — Telephone Encounter (Signed)
Discussed with patient that we need to have lab work done before infusion.  She agreed to have the labs done this AM, and we can reschedule her after ok from Dr. Dwyane Dee.  She agreed to this.

## 2021-09-05 NOTE — Telephone Encounter (Signed)
Victoria Blair came and talked to me on yesterday and patient got labs drawn

## 2021-09-05 NOTE — Therapy (Signed)
OUTPATIENT PHYSICAL THERAPY TREATMENT NOTE   Patient Name: Victoria Blair MRN: [id number] DOB:[birth year], 76 y.o., female Today's Date: 09/06/2021  PCP: Orpah Melter, MD REFERRING PROVIDER: Vallarie Mare, MD   PT End of Session - 09/06/21 1145     Visit Number 2    Number of Visits 9    Date for PT Re-Evaluation 10/24/21    Authorization Type [employer] MCR    Authorization Time Period FOTO v6, v10    Progress Note Due on Visit 10    PT Start Time 1150    PT Stop Time 1749    PT Time Calculation (min) 45 min    Activity Tolerance Patient tolerated treatment well    Behavior During Therapy WFL for tasks assessed/performed             Past Medical History:  Diagnosis Date   Breast cancer ([place]) 1997   Depression    GERD (gastroesophageal reflux disease)    Graves disease    Hemorrhoids    Hypertension    Migraines    Osteoporosis    Personal history of radiation therapy 1998   Vitamin D deficiency    Past Surgical History:  Procedure Laterality Date   BREAST BIOPSY Right 08/11/1996   BREAST LUMPECTOMY Right [place]     SPINE SURGERY     Patient Active Problem List   Diagnosis Date Noted   Lung nodule 12/31/2013   Tobacco abuse 12/31/2013   Unspecified essential hypertension 09/03/2013   Hypothyroidism following radioiodine therapy 05/26/2013   Pure hypercholesterolemia 05/26/2013   DEPRESSION 04/26/2007   Osteopenia 04/26/2007   BREAST CANCER, HX OF 04/26/2007    REFERRING DIAG: Bilateral leg numbness   THERAPY DIAG:  Pain in left hip  Muscle weakness (generalized)  Unsteadiness on feet  PERTINENT HISTORY: Osteoporosis  PRECAUTIONS: N/A  SUBJECTIVE: Pt reports that her Lt hip pain is about the same since her eval. She reports varied adherence to her HEP.  PAIN:  Are you having pain? Yes VAS scale: 5/10 Pain location: Hip Pain orientation: Left  PAIN TYPE: aching Pain description: intermittent       OBJECTIVE:    *Unless otherwise noted, objective information collected previously*  DIAGNOSTIC FINDINGS:  07/13/2021: DG Bone Density: Assessment: Patient has OSTEOPOROSIS according to the [facility] classification for osteoporosis (see below). Fracture risk: high   PATIENT SURVEYS:  FOTO 63%, projected 73% by visit 12   SCREENING FOR RED FLAGS: Bowel or bladder incontinence: No Cauda equina syndrome: No     COGNITION:          Overall cognitive status: Within functional limits for tasks assessed                        SENSATION:          Light touch: Appears intact             MUSCLE LENGTH: Hamstring 90/90 test: WNL BIL Thomas test: (-) BIL   POSTURE:  Forward head posture/ BIL rounded shoulders/ decreased lumbar lordosis   PALPATION: TTP about L gluteal region over sciatic nerve distribution.   LUMBAR AROM   AROM AROM  08/29/2021  Flexion WNL  Extension WNL  Right lateral flexion WNL  Left lateral flexion WNL  Right rotation WNL  Left rotation WNL   (Blank rows = not tested)   LE AROM:   AROM Right 08/29/2021 Left 08/29/2021  Hip flexion WNL WNL  Hip extension WNL WNL  Hip abduction WNL WNL  Hip adduction      Hip internal rotation WNL WNL  Hip external rotation WNL WNL  Knee flexion      Knee extension      Ankle dorsiflexion      Ankle plantarflexion      Ankle inversion      Ankle eversion       (Blank rows = not tested)   LE MMT:   MMT Right 08/29/2021 Left 08/29/2021  Hip flexion 4/5 3+/5  Hip extension 3/5 3/5  Hip abduction 4+/5 4+/5  Hip adduction      Hip internal rotation      Hip external rotation      Knee flexion      Knee extension      Ankle dorsiflexion      Ankle plantarflexion      Ankle inversion      Ankle eversion       (Blank rows = not tested)   LUMBAR SPECIAL TESTS:  Straight leg raise test: Negative, Slump test: Negative, Trendelenburg sign: Negative, and Thomas test: Negative Hip Scour test: (-) BIL FABER: (-)  BIL Ober's: (-) BIL Piriformis test: (-) BIL   FUNCTIONAL TESTS:  Double Leg Squat: WNL SLS: 8sec on L, 4sec on R           TODAY'S TREATMENT  [facility] Adult PT Treatment:                                                DATE: 09/05/2021 Therapeutic Exercise: Sidelying hip abduction 2x10 with 3-sec holds BIL Supine 90/90 abdominal isometric with handhold resistance 4x30sec Kickstand stance Pallof press with 3# cable 2x10 with 5-sec hold BIL on each leg Hip thrust with 23# cable with knees on Airex pad 3x8 10# kettlebell stand-ups 3x10 Trunk side bending with 10# kettlebell 2x10 BIL Manual Therapy: N/A Neuromuscular re-ed: N/A Therapeutic Activity: N/A Modalities: N/A Self Care: N/A   TREATMENT 08/29/2021:   Therapeutic Exercise: Access Code: [email or web address]  Exercises Bridge Marching with Eccentric Lowering - 1 x daily - 7 x weekly - 3 sets - 10 reps Supine Bicycles - 1 x daily - 7 x weekly - 3 sets - 20 reps  Added 09/06/2021: Abdominal Isometric Hold - FEET OFF TABLE* - 1 x daily - 7 x weekly - 3 sets - 30-sec hold Sidelying Hip Abduction - 1 x daily - 7 x weekly - 2 sets - 10 reps - 3-sec hold    Manual Therapy: - N/A   Neuromuscular re-ed: - N/A   Therapeutic Activity: - N/A   Self-care/Home Management: - N/A       PATIENT EDUCATION:  Education details: Updated HEP method: Verbal, demonstration, handout Education comprehension: Pt verbalized understanding and returned demonstration     HOME EXERCISE PROGRAM: Supine bridge with alternating marching 3x10 daily Supine bicycles with YTB 3x10 daily *Create Medbridge HEP with these exercises*   ASSESSMENT:   CLINICAL IMPRESSION: Pt responded well to all interventions today, demonstrating good form and no increase in pain with selected exercises. She reports that exercises provided a good challenge without increasing her pain and even adds that the exercises helped to decrease her pain today from 5/10 to 3/10.  She will continue to benefit from skilled PT to  address her primary impairments and return to her prior level of function with less limitation.   REHAB POTENTIAL: Good   CLINICAL DECISION MAKING: Stable/uncomplicated   EVALUATION COMPLEXITY: Low     GOALS: Goals reviewed with patient? Yes   SHORT TERM GOALS:   STG Name Target Date Goal status  1 Pt will report understanding and adherence to her HEP in order to promote independence in the management of her primary impairments Baseline:  09/26/2021 INITIAL    LONG TERM GOALS:    LTG Name Target Date Goal status  1 Pt will report ability to sleep through the night without sleep disturbances due to pain in order to get a good night's rest. Baseline: 10/24/2021 INITIAL  2 Pt will achieve a FOTO score of 73% or greater in order to demonstrate functional improvement in regard to her hip pain. Baseline: 10/24/2021 INITIAL  3 Pt will achieve global BIL hip strength =/> 4+/5 in order to progress an independent LE strengthening regimen without limitation. Baseline: 10/24/2021 INITIAL    PLAN: PT FREQUENCY: 1x/week   PT DURATION: 8 weeks   PLANNED INTERVENTIONS: Therapeutic exercises, Therapeutic activity, Neuro Muscular re-education, Balance training, Gait training, Patient/Family education, Joint mobilization, Aquatic Therapy, Spinal mobilization, Traction, and Manual therapy   PLAN FOR NEXT SESSION: Progress closed-chain hip/core strengthening.    Vanessa [place], PT, DPT 09/06/21 12:38 PM

## 2021-09-06 ENCOUNTER — Other Ambulatory Visit: Payer: Self-pay

## 2021-09-06 ENCOUNTER — Ambulatory Visit: Payer: Medicare HMO

## 2021-09-06 DIAGNOSIS — M25552 Pain in left hip: Secondary | ICD-10-CM | POA: Diagnosis not present

## 2021-09-06 DIAGNOSIS — M6281 Muscle weakness (generalized): Secondary | ICD-10-CM

## 2021-09-06 DIAGNOSIS — R2681 Unsteadiness on feet: Secondary | ICD-10-CM | POA: Diagnosis not present

## 2021-09-12 ENCOUNTER — Ambulatory Visit (INDEPENDENT_AMBULATORY_CARE_PROVIDER_SITE_OTHER): Payer: Medicare HMO | Admitting: Nutrition

## 2021-09-12 ENCOUNTER — Other Ambulatory Visit: Payer: Self-pay

## 2021-09-12 DIAGNOSIS — M810 Age-related osteoporosis without current pathological fracture: Secondary | ICD-10-CM

## 2021-09-12 NOTE — Therapy (Signed)
OUTPATIENT PHYSICAL THERAPY TREATMENT NOTE   Patient Name: Victoria Blair MRN: [id number] DOB:1946-06-04, 76 y.o., female Today's Date: 09/13/2021  PCP: Orpah Melter, MD REFERRING PROVIDER: Vallarie Mare, MD   PT End of Session - 09/13/21 1206     Visit Number 3    Number of Visits 9    Date for PT Re-Evaluation 10/24/21    Authorization Type [employer] MCR    Authorization Time Period FOTO v6, v10    Progress Note Due on Visit 10    PT Start Time 1210    PT Stop Time 1255    PT Time Calculation (min) 45 min    Activity Tolerance Patient tolerated treatment well    Behavior During Therapy WFL for tasks assessed/performed              Past Medical History:  Diagnosis Date   Breast cancer ([place]) 1997   Depression    GERD (gastroesophageal reflux disease)    Graves disease    Hemorrhoids    Hypertension    Migraines    Osteoporosis    Personal history of radiation therapy 1998   Vitamin D deficiency    Past Surgical History:  Procedure Laterality Date   BREAST BIOPSY Right 08/11/1996   BREAST LUMPECTOMY Right [place]     SPINE SURGERY     Patient Active Problem List   Diagnosis Date Noted   Lung nodule 12/31/2013   Tobacco abuse 12/31/2013   Unspecified essential hypertension 09/03/2013   Hypothyroidism following radioiodine therapy 05/26/2013   Pure hypercholesterolemia 05/26/2013   DEPRESSION 04/26/2007   Osteopenia 04/26/2007   BREAST CANCER, HX OF 04/26/2007    REFERRING DIAG: Bilateral leg numbness   THERAPY DIAG:  Pain in left hip  Muscle weakness (generalized)  Unsteadiness on feet  PERTINENT HISTORY: Osteoporosis  PRECAUTIONS: N/A  SUBJECTIVE: Pt reports having very low level pain today, 1-2/10. She reports not doing her HEP due to not being able to pull it up online.  PAIN:  Are you having pain? Yes VAS scale: 1-2/10 Pain location: Hip Pain orientation: Left  PAIN TYPE: aching Pain  description: intermittent      OBJECTIVE:    *Unless otherwise noted, objective information collected previously*  DIAGNOSTIC FINDINGS:  07/13/2021: DG Bone Density: Assessment: Patient has OSTEOPOROSIS according to the [facility] classification for osteoporosis (see below). Fracture risk: high   PATIENT SURVEYS:  FOTO 63%, projected 73% by visit 12   SCREENING FOR RED FLAGS: Bowel or bladder incontinence: No Cauda equina syndrome: No     COGNITION:          Overall cognitive status: Within functional limits for tasks assessed                        SENSATION:          Light touch: Appears intact             MUSCLE LENGTH: Hamstring 90/90 test: WNL BIL Thomas test: (-) BIL   POSTURE:  Forward head posture/ BIL rounded shoulders/ decreased lumbar lordosis   PALPATION: TTP about L gluteal region over sciatic nerve distribution.   LUMBAR AROM   AROM AROM  08/29/2021  Flexion WNL  Extension WNL  Right lateral flexion WNL  Left lateral flexion WNL  Right rotation WNL  Left rotation WNL   (Blank rows = not tested)   LE AROM:   AROM  Right 08/29/2021 Left 08/29/2021  Hip flexion WNL WNL  Hip extension WNL WNL  Hip abduction WNL WNL  Hip adduction      Hip internal rotation WNL WNL  Hip external rotation WNL WNL  Knee flexion      Knee extension      Ankle dorsiflexion      Ankle plantarflexion      Ankle inversion      Ankle eversion       (Blank rows = not tested)   LE MMT:   MMT Right 08/29/2021 Left 08/29/2021  Hip flexion 4/5 3+/5  Hip extension 3/5 3/5  Hip abduction 4+/5 4+/5  Hip adduction      Hip internal rotation      Hip external rotation      Knee flexion      Knee extension      Ankle dorsiflexion      Ankle plantarflexion      Ankle inversion      Ankle eversion       (Blank rows = not tested)   LUMBAR SPECIAL TESTS:  Straight leg raise test: Negative, Slump test: Negative, Trendelenburg sign: Negative, and Thomas test: Negative Hip Scour  test: (-) BIL FABER: (-) BIL Ober's: (-) BIL Piriformis test: (-) BIL   FUNCTIONAL TESTS:  Double Leg Squat: WNL SLS: 8sec on L, 4sec on R           TODAY'S TREATMENT   [facility] Adult PT Treatment:                                                DATE: 09/13/2021 Therapeutic Exercise: NuStep level 5 x5 minutes while collecting subjective information Standing Cybex hip abduction with 25#, 2x10 BIL Standing Cybex hip extension with 25#, 2x10 BIL Cybex leg press 3x8 with 40# Kickstand stance Pallof press with 3# cable 2x10 with 5-sec hold BIL on each leg 10# kettlebell swing, 2x10 Manual Therapy: Lateral Lt hip distraction x6 minutes Long-axis Lt hip distraction x 4 minutes Neuromuscular re-ed: N/A Therapeutic Activity: N/A Modalities: N/A Self Care: N/A   [facility] Adult PT Treatment:                                                DATE: 09/05/2021 Therapeutic Exercise: Sidelying hip abduction 2x10 with 3-sec holds BIL Supine 90/90 abdominal isometric with handhold resistance 4x30sec Kickstand stance Pallof press with 3# cable 2x10 with 5-sec hold BIL on each leg Hip thrust with 23# cable with knees on Airex pad 3x8 10# kettlebell stand-ups 3x10 Trunk side bending with 10# kettlebell 2x10 BIL Manual Therapy: N/A Neuromuscular re-ed: N/A Therapeutic Activity: N/A Modalities: N/A Self Care: N/A   TREATMENT 08/29/2021:   Therapeutic Exercise: Access Code: [email or web address]  Exercises Bridge Marching with Eccentric Lowering - 1 x daily - 7 x weekly - 3 sets - 10 reps Supine Bicycles - 1 x daily - 7 x weekly - 3 sets - 20 reps  Added 09/06/2021: Abdominal Isometric Hold - FEET OFF TABLE* - 1 x daily - 7 x weekly - 3 sets - 30-sec hold Sidelying Hip Abduction - 1 x daily - 7 x weekly - 2 sets - 10 reps - 3-sec hold  Manual Therapy: - N/A   Neuromuscular re-ed: - N/A   Therapeutic Activity: - N/A   Self-care/Home Management: - N/A       PATIENT EDUCATION:   Education details: Educated on importance of daily HEP adherence method: Verbal Education comprehension: Pt verbalized understanding     HOME EXERCISE PROGRAM: Supine bridge with alternating marching 3x10 daily Supine bicycles with YTB 3x10 daily *Create Medbridge HEP with these exercises*   ASSESSMENT:   CLINICAL IMPRESSION: Pt responded well to all interventions today, demonstrating good form and no increase in pain with selected exercises. Additionally, she reports a decrease in hip pain from 2/10 to 0/10 following manual hip distraction. She then tolerated high-level hip strengthening exercises without a return to baseline pain. She will continue to benefit from skilled PT to address her primary impairments and return to her prior level of function with less limitation.   REHAB POTENTIAL: Good   CLINICAL DECISION MAKING: Stable/uncomplicated   EVALUATION COMPLEXITY: Low     GOALS: Goals reviewed with patient? Yes   SHORT TERM GOALS:   STG Name Target Date Goal status  1 Pt will report understanding and adherence to her HEP in order to promote independence in the management of her primary impairments Baseline:  09/26/2021 INITIAL    LONG TERM GOALS:    LTG Name Target Date Goal status  1 Pt will report ability to sleep through the night without sleep disturbances due to pain in order to get a good night's rest. Baseline: 10/24/2021 INITIAL  2 Pt will achieve a FOTO score of 73% or greater in order to demonstrate functional improvement in regard to her hip pain. Baseline: 10/24/2021 INITIAL  3 Pt will achieve global BIL hip strength =/> 4+/5 in order to progress an independent LE strengthening regimen without limitation. Baseline: 10/24/2021 INITIAL    PLAN: PT FREQUENCY: 1x/week   PT DURATION: 8 weeks   PLANNED INTERVENTIONS: Therapeutic exercises, Therapeutic activity, Neuro Muscular re-education, Balance training, Gait training, Patient/Family education, Joint  mobilization, Aquatic Therapy, Spinal mobilization, Traction, and Manual therapy   PLAN FOR NEXT SESSION: Progress closed-chain hip/core strengthening, continue hip manual distraction techniques for therapeutic response.    Vanessa [place], PT, DPT 09/13/21 12:54 PM

## 2021-09-12 NOTE — Patient Instructions (Signed)
Drink 3-4 glasses of water today Continue to take your Calcium and Vit. D per Dr. Ronnie Derby orders

## 2021-09-12 NOTE — Progress Notes (Signed)
Per Dr. Ronnie Derby note on 05/25/21, and after the patient signed the consent, an IV was started in patient's right arm.  2 ccs of normal saline was infused, and site showed no signs of infiltration.  AT 1PM, 5mg . Of Zolendronic acid was infused until 1:30PM.  Patient denied dizziness or discomfort during the infusion.  At 1:30PM, 2ccs of Normal saline was infused and the IV was D/C.  Site showed no signs of redness or swelling.  She was encouraged to drink 3-4 glasses of water today and to continue to take her Calcium and Vit. D per Dr. Ronnie Derby orders.  She agreed to do this and had no final questions.

## 2021-09-13 ENCOUNTER — Ambulatory Visit: Payer: Medicare HMO

## 2021-09-13 DIAGNOSIS — M6281 Muscle weakness (generalized): Secondary | ICD-10-CM

## 2021-09-13 DIAGNOSIS — M25552 Pain in left hip: Secondary | ICD-10-CM | POA: Diagnosis not present

## 2021-09-13 DIAGNOSIS — R2681 Unsteadiness on feet: Secondary | ICD-10-CM

## 2021-09-19 NOTE — Therapy (Signed)
OUTPATIENT PHYSICAL THERAPY TREATMENT NOTE   Patient Name: Victoria Blair MRN: [id number] DOB:1946/04/11, 76 y.o., female Today's Date: 09/20/2021  PCP: Orpah Melter, MD REFERRING PROVIDER: Vallarie Mare, MD   PT End of Session - 09/20/21 1215     Visit Number 4    Number of Visits 9    Date for PT Re-Evaluation 10/24/21    Authorization Type [employer] MCR    Authorization Time Period FOTO v6, v10    Progress Note Due on Visit 10    PT Start Time 1215    PT Stop Time 1300    PT Time Calculation (min) 45 min    Activity Tolerance Patient tolerated treatment well    Behavior During Therapy WFL for tasks assessed/performed               Past Medical History:  Diagnosis Date   Breast cancer ([place]) 1997   Depression    GERD (gastroesophageal reflux disease)    Graves disease    Hemorrhoids    Hypertension    Migraines    Osteoporosis    Personal history of radiation therapy 1998   Vitamin D deficiency    Past Surgical History:  Procedure Laterality Date   BREAST BIOPSY Right 08/11/1996   BREAST LUMPECTOMY Right [place]     SPINE SURGERY     Patient Active Problem List   Diagnosis Date Noted   Lung nodule 12/31/2013   Tobacco abuse 12/31/2013   Unspecified essential hypertension 09/03/2013   Hypothyroidism following radioiodine therapy 05/26/2013   Pure hypercholesterolemia 05/26/2013   DEPRESSION 04/26/2007   Osteopenia 04/26/2007   BREAST CANCER, HX OF 04/26/2007    REFERRING DIAG: Bilateral leg numbness   THERAPY DIAG:  Pain in left hip  Muscle weakness (generalized)  Unsteadiness on feet  PERTINENT HISTORY: Osteoporosis  PRECAUTIONS: N/A  SUBJECTIVE: Pt reports that hip distraction helped her pain at her last visit.  She has no pain currently. She adds that she has been doing her HEP regularly and that she found a 5-lb kettlebell that she hopes to use at home with her exercises.  PAIN:  Are you  having pain? No VAS scale: 0/10 Pain location: Hip Pain orientation: Left  PAIN TYPE: aching Pain description: intermittent      OBJECTIVE:    *Unless otherwise noted, objective information collected previously*  DIAGNOSTIC FINDINGS:  07/13/2021: DG Bone Density: Assessment: Patient has OSTEOPOROSIS according to the [facility] classification for osteoporosis (see below). Fracture risk: high   PATIENT SURVEYS:  FOTO 63%, projected 73% by visit 12   SCREENING FOR RED FLAGS: Bowel or bladder incontinence: No Cauda equina syndrome: No     COGNITION:          Overall cognitive status: Within functional limits for tasks assessed                        SENSATION:          Light touch: Appears intact             MUSCLE LENGTH: Hamstring 90/90 test: WNL BIL Thomas test: (-) BIL   POSTURE:  Forward head posture/ BIL rounded shoulders/ decreased lumbar lordosis   PALPATION: TTP about L gluteal region over sciatic nerve distribution.   LUMBAR AROM   AROM AROM  08/29/2021  Flexion WNL  Extension WNL  Right lateral flexion WNL  Left lateral flexion WNL  Right rotation WNL  Left rotation WNL   (Blank rows = not tested)   LE AROM:   AROM Right 08/29/2021 Left 08/29/2021  Hip flexion WNL WNL  Hip extension WNL WNL  Hip abduction WNL WNL  Hip adduction      Hip internal rotation WNL WNL  Hip external rotation WNL WNL  Knee flexion      Knee extension      Ankle dorsiflexion      Ankle plantarflexion      Ankle inversion      Ankle eversion       (Blank rows = not tested)   LE MMT:   MMT Right 08/29/2021 Left 08/29/2021 Right 09/20/2021 Left 09/20/2021  Hip flexion 4/5 3+/5 4+/5 4+/5  Hip extension 3/5 3/5 4/5 3+/5  Hip abduction 4+/5 4+/5 5/5 5/5  Hip adduction        Hip internal rotation        Hip external rotation        Knee flexion        Knee extension        Ankle dorsiflexion        Ankle plantarflexion        Ankle inversion        Ankle eversion          (Blank rows = not tested)   LUMBAR SPECIAL TESTS:  Straight leg raise test: Negative, Slump test: Negative, Trendelenburg sign: Negative, and Thomas test: Negative Hip Scour test: (-) BIL FABER: (-) BIL Ober's: (-) BIL Piriformis test: (-) BIL   FUNCTIONAL TESTS:  Double Leg Squat: WNL SLS: 8sec on L, 4sec on R           TODAY'S TREATMENT   [facility] Adult PT Treatment:                                                DATE: 09/20/2021 Therapeutic Exercise: NuStep level 5 x5 minutes while collecting subjective information Standing Cybex hip abduction with 30#, 2x10 BIL Standing Cybex hip extension with 30#, 2x10 BIL Cybex leg press 3x8 with 45# Kickstand stance Pallof press with 3# cable 2x10 with 5-sec hold BIL on each leg 15# kettlebell swing, 3x10 Manual Therapy: N/A Neuromuscular re-ed: N/A Therapeutic Activity: N/A Modalities: N/A Self Care: N/A   [facility] Adult PT Treatment:                                                DATE: 09/13/2021 Therapeutic Exercise: NuStep level 5 x5 minutes while collecting subjective information Standing Cybex hip abduction with 25#, 2x10 BIL Standing Cybex hip extension with 25#, 2x10 BIL Cybex leg press 3x8 with 40# Kickstand stance Pallof press with 3# cable 2x10 with 5-sec hold BIL on each leg 10# kettlebell swing, 2x10 Manual Therapy: Lateral Lt hip distraction x6 minutes Long-axis Lt hip distraction x 4 minutes Neuromuscular re-ed: N/A Therapeutic Activity: N/A Modalities: N/A Self Care: N/A     TREATMENT 08/29/2021:   Therapeutic Exercise: Access Code: [email or web address]  Exercises Bridge Marching with Eccentric Lowering - 1 x daily - 7 x weekly - 3 sets - 10 reps Supine Bicycles - 1 x daily - 7 x weekly - 3 sets -  20 reps  Added 09/06/2021: Abdominal Isometric Hold - FEET OFF TABLE* - 1 x daily - 7 x weekly - 3 sets - 30-sec hold Sidelying Hip Abduction - 1 x daily - 7 x weekly - 2 sets - 10 reps - 3-sec hold    Manual  Therapy: - N/A   Neuromuscular re-ed: - N/A   Therapeutic Activity: - N/A   Self-care/Home Management: - N/A       PATIENT EDUCATION:  Education details: Educated on importance of daily HEP adherence method: Verbal Education comprehension: Pt verbalized understanding     HOME EXERCISE PROGRAM: Supine bridge with alternating marching 3x10 daily Supine bicycles with YTB 3x10 daily *Create Medbridge HEP with these exercises*   ASSESSMENT:   CLINICAL IMPRESSION: Pt responded well to all interventions today, demonstrating good form and no increase in pain with selected exercises. Upon re-assessment, pt demonstrates improvements in BIL global hip strength. She also shows improvement in baseline hip pain, not requiring manual therapy for pain relief today. She will continue to benefit from skilled PT to address her primary impairments and return to her prior level of function with less limitation.   REHAB POTENTIAL: Good   CLINICAL DECISION MAKING: Stable/uncomplicated   EVALUATION COMPLEXITY: Low     GOALS: Goals reviewed with patient? Yes   SHORT TERM GOALS:   STG Name Target Date Goal status  1 Pt will report understanding and adherence to her HEP in order to promote independence in the management of her primary impairments Baseline: Achieved 09/20/2021 09/26/2021 Achieved    LONG TERM GOALS:    LTG Name Target Date Goal status  1 Pt will report ability to sleep through the night without sleep disturbances due to pain in order to get a good night's rest. Baseline: 10/24/2021 INITIAL  2 Pt will achieve a FOTO score of 73% or greater in order to demonstrate functional improvement in regard to her hip pain. Baseline: 10/24/2021 INITIAL  3 Pt will achieve global BIL hip strength =/> 4+/5 in order to progress an independent LE strengthening regimen without limitation. Baseline: See updated MMT chart 10/24/2021 In progress    PLAN: PT FREQUENCY: 1x/week   PT DURATION: 8  weeks   PLANNED INTERVENTIONS: Therapeutic exercises, Therapeutic activity, Neuro Muscular re-education, Balance training, Gait training, Patient/Family education, Joint mobilization, Aquatic Therapy, Spinal mobilization, Traction, and Manual therapy   PLAN FOR NEXT SESSION: Progress closed-chain hip/core strengthening, continue hip manual distraction techniques for therapeutic response.    Vanessa [place], PT, DPT 09/20/21 12:56 PM

## 2021-09-20 ENCOUNTER — Other Ambulatory Visit: Payer: Self-pay

## 2021-09-20 ENCOUNTER — Ambulatory Visit: Payer: Medicare HMO

## 2021-09-20 DIAGNOSIS — M25552 Pain in left hip: Secondary | ICD-10-CM

## 2021-09-20 DIAGNOSIS — M6281 Muscle weakness (generalized): Secondary | ICD-10-CM | POA: Diagnosis not present

## 2021-09-20 DIAGNOSIS — R2681 Unsteadiness on feet: Secondary | ICD-10-CM | POA: Diagnosis not present

## 2021-09-20 DIAGNOSIS — C50912 Malignant neoplasm of unspecified site of left female breast: Secondary | ICD-10-CM | POA: Diagnosis not present

## 2021-09-26 NOTE — Therapy (Signed)
OUTPATIENT PHYSICAL THERAPY TREATMENT NOTE/ DISCHARGE SUMMARY   Patient Name: Victoria Blair MRN: [id number] DOB:07/24/1946, 76 y.o., female Today's Date: 09/27/2021  PCP: Orpah Melter, MD REFERRING PROVIDER: Vallarie Mare, MD   PT End of Session - 09/27/21 1218     Visit Number 5    Number of Visits 9    Date for PT Re-Evaluation 10/24/21    Authorization Type [employer] MCR    Authorization Time Period FOTO v6, v10    Progress Note Due on Visit 10    PT Start Time 1215    PT Stop Time 1300    PT Time Calculation (min) 45 min    Activity Tolerance Patient tolerated treatment well    Behavior During Therapy WFL for tasks assessed/performed                Past Medical History:  Diagnosis Date   Breast cancer ([place]) 1997   Depression    GERD (gastroesophageal reflux disease)    Graves disease    Hemorrhoids    Hypertension    Migraines    Osteoporosis    Personal history of radiation therapy 1998   Vitamin D deficiency    Past Surgical History:  Procedure Laterality Date   BREAST BIOPSY Right 08/11/1996   BREAST LUMPECTOMY Right [place]     SPINE SURGERY     Patient Active Problem List   Diagnosis Date Noted   Lung nodule 12/31/2013   Tobacco abuse 12/31/2013   Unspecified essential hypertension 09/03/2013   Hypothyroidism following radioiodine therapy 05/26/2013   Pure hypercholesterolemia 05/26/2013   DEPRESSION 04/26/2007   Osteopenia 04/26/2007   BREAST CANCER, HX OF 04/26/2007    REFERRING DIAG: Bilateral leg numbness   THERAPY DIAG:  Pain in left hip  Muscle weakness (generalized)  Unsteadiness on feet  PERTINENT HISTORY: Osteoporosis  PRECAUTIONS: N/A  SUBJECTIVE: Pt reports that she feels much better than at the start of PT, adding that she feels ready to be discharged at this time. She reports that her home exercises have been very helpful and that she has had no trouble sleeping due to hip  pain.  PAIN:  Are you having pain? No VAS scale: 0/10 Pain location: Hip Pain orientation: Left  PAIN TYPE: aching Pain description: intermittent      OBJECTIVE:    *Unless otherwise noted, objective information collected previously*  DIAGNOSTIC FINDINGS:  07/13/2021: DG Bone Density: Assessment: Patient has OSTEOPOROSIS according to the [facility] classification for osteoporosis (see below). Fracture risk: high   PATIENT SURVEYS:  FOTO 63%, projected 73% by visit 12 09/27/2021: 84%   SCREENING FOR RED FLAGS: Bowel or bladder incontinence: No Cauda equina syndrome: No     COGNITION:          Overall cognitive status: Within functional limits for tasks assessed                        SENSATION:          Light touch: Appears intact             MUSCLE LENGTH: Hamstring 90/90 test: WNL BIL Thomas test: (-) BIL   POSTURE:  Forward head posture/ BIL rounded shoulders/ decreased lumbar lordosis   PALPATION: TTP about L gluteal region over sciatic nerve distribution.   LUMBAR AROM   AROM AROM  08/29/2021  Flexion WNL  Extension WNL  Right lateral flexion WNL  Left lateral flexion WNL  Right rotation WNL  Left rotation WNL   (Blank rows = not tested)   LE AROM:   AROM Right 08/29/2021 Left 08/29/2021  Hip flexion WNL WNL  Hip extension WNL WNL  Hip abduction WNL WNL  Hip adduction      Hip internal rotation WNL WNL  Hip external rotation WNL WNL  Knee flexion      Knee extension      Ankle dorsiflexion      Ankle plantarflexion      Ankle inversion      Ankle eversion       (Blank rows = not tested)   LE MMT:   MMT Right 08/29/2021 Left 08/29/2021 Right 09/20/2021 Left 09/20/2021 Right 09/27/2021 Left 09/27/2021  Hip flexion 4/5 3+/5 4+/5 4+/5 5/5 4+/5  Hip extension 3/5 3/5 4/5 3+/5 4+/5 4/5  Hip abduction 4+/5 4+/5 5/5 5/5 5/5 5/5  Hip adduction          Hip internal rotation          Hip external rotation          Knee flexion          Knee extension           Ankle dorsiflexion          Ankle plantarflexion          Ankle inversion          Ankle eversion           (Blank rows = not tested)   LUMBAR SPECIAL TESTS:  Straight leg raise test: Negative, Slump test: Negative, Trendelenburg sign: Negative, and Thomas test: Negative Hip Scour test: (-) BIL FABER: (-) BIL Ober's: (-) BIL Piriformis test: (-) BIL   FUNCTIONAL TESTS:  Double Leg Squat: WNL SLS: 8sec on L, 4sec on R           TODAY'S TREATMENT   [facility] Adult PT Treatment:                                                DATE: 09/27/2021 Therapeutic Exercise: Exercise bike level 4 resistance x8 minutes while collecting subjective information and pt completing FOTO Mini-squat side steps with RTB above knees in // bars 3x2 laps Squat 3x10 Kickstand stance Pallof press with GTB 2x10 with 5-sec hold BIL on each leg 15# kettlebell swing, 3x10 Education on objective measures, progress in PT, updated HEP Standing trunk rotation stretch 2x10 Manual Therapy: N/A Neuromuscular re-ed: N/A Therapeutic Activity: N/A Modalities: N/A Self Care: N/A   [facility] Adult PT Treatment:                                                 DATE: 09/20/2021 Therapeutic Exercise: NuStep level 5 x5 minutes while collecting subjective information Standing Cybex hip abduction with 30#, 2x10 BIL Standing Cybex hip extension with 30#, 2x10 BIL Cybex leg press 3x8 with 45# Kickstand stance Pallof press with 3# cable 2x10 with 5-sec hold BIL on each leg 15# kettlebell swing, 3x10 Manual Therapy: N/A Neuromuscular re-ed: N/A Therapeutic Activity: N/A Modalities: N/A Self Care: N/A   [facility] Adult PT Treatment:  DATE: 09/13/2021 Therapeutic Exercise: NuStep level 5 x5 minutes while collecting subjective information Standing Cybex hip abduction with 25#, 2x10 BIL Standing Cybex hip extension with 25#, 2x10 BIL Cybex leg press 3x8 with 40# Kickstand  stance Pallof press with 3# cable 2x10 with 5-sec hold BIL on each leg 10# kettlebell swing, 2x10 Manual Therapy: Lateral Lt hip distraction x6 minutes Long-axis Lt hip distraction x 4 minutes Neuromuscular re-ed: N/A Therapeutic Activity: N/A Modalities: N/A Self Care: N/A        PATIENT EDUCATION:  Education details: Updated HEP method: Verbal Education comprehension: Pt verbalized understanding     HOME EXERCISE PROGRAM: Access Code: [email or web address] URL: [email or web address] Date: 09/27/2021 Prepared by: Vanessa Hayden  Exercises Supine Bicycles - 1 x daily - 7 x weekly - 3 sets - 20 reps Marching Bridge - 1 x daily - 7 x weekly - 3 sets - 10 reps - 3-sec hold Side Stepping with Resistance at Thighs - 1 x daily - 7 x weekly - 3 sets - 20 reps Squat - 1 x daily - 7 x weekly - 3 sets - 10 reps Kettlebell Swing - 1 x daily - 7 x weekly - 3 sets - 10 reps    ASSESSMENT:   CLINICAL IMPRESSION: Upon re-assessment of pt objective measures and goals, the pt has made excellent progress in her functional capacity and hip strength. Due to this and pt's report of feeling independent in the management of her impairments moving forward, she is discharged from PT at this time with an updated HEP. She responded well to new exercises performed today.   REHAB POTENTIAL: Good   CLINICAL DECISION MAKING: Stable/uncomplicated   EVALUATION COMPLEXITY: Low     GOALS: Goals reviewed with patient? Yes   SHORT TERM GOALS:   STG Name Target Date Goal status  1 Pt will report understanding and adherence to her HEP in order to promote independence in the management of her primary impairments Baseline: Achieved 09/20/2021 09/26/2021 Achieved    LONG TERM GOALS:    LTG Name Target Date Goal status  1 Pt will report ability to sleep through the night without sleep disturbances due to pain in order to get a good night's rest. 09/27/2021: Pt reports no sleep disturbances due to  hip pain. 10/24/2021 Achieved  2 Pt will achieve a FOTO score of 73% or greater in order to demonstrate functional improvement in regard to her hip pain. Baseline:63% 09/27/2021: 84% 10/24/2021 Achieved  3 Pt will achieve global BIL hip strength =/> 4+/5 in order to progress an independent LE strengthening regimen without limitation. Baseline: See updated MMT chart 09/27/2021: All >/= 4+/5, except Lt hip extension (4/5) 10/24/2021 Achieved    PLAN: PT FREQUENCY: 1x/week   PT DURATION: 8 weeks   PLANNED INTERVENTIONS: Therapeutic exercises, Therapeutic activity, Neuro Muscular re-education, Balance training, Gait training, Patient/Family education, Joint mobilization, Aquatic Therapy, Spinal mobilization, Traction, and Manual therapy   PLAN FOR NEXT SESSION: Pt is discharged from PT at this time.    Vanessa Strasburg, PT, DPT 09/27/21 12:43 PM  PHYSICAL THERAPY DISCHARGE SUMMARY  Visits from Start of Care: 5  Current functional level related to goals / functional outcomes: Pt achieved all of her functional rehab goals   Remaining deficits: Minor hip strength deficits   Education / Equipment: HEP   Patient agrees to discharge. Patient goals were met. Patient is being discharged due to meeting the stated rehab goals.

## 2021-09-27 ENCOUNTER — Ambulatory Visit: Payer: Medicare HMO | Attending: Neurosurgery

## 2021-09-27 ENCOUNTER — Other Ambulatory Visit: Payer: Self-pay

## 2021-09-27 DIAGNOSIS — M6281 Muscle weakness (generalized): Secondary | ICD-10-CM | POA: Insufficient documentation

## 2021-09-27 DIAGNOSIS — M25552 Pain in left hip: Secondary | ICD-10-CM | POA: Insufficient documentation

## 2021-09-27 DIAGNOSIS — R2681 Unsteadiness on feet: Secondary | ICD-10-CM | POA: Diagnosis not present

## 2021-10-02 DIAGNOSIS — K219 Gastro-esophageal reflux disease without esophagitis: Secondary | ICD-10-CM | POA: Diagnosis not present

## 2021-10-02 DIAGNOSIS — J069 Acute upper respiratory infection, unspecified: Secondary | ICD-10-CM | POA: Diagnosis not present

## 2021-10-02 DIAGNOSIS — Z720 Tobacco use: Secondary | ICD-10-CM | POA: Diagnosis not present

## 2021-10-05 DIAGNOSIS — H35453 Secondary pigmentary degeneration, bilateral: Secondary | ICD-10-CM | POA: Diagnosis not present

## 2021-10-05 DIAGNOSIS — Z961 Presence of intraocular lens: Secondary | ICD-10-CM | POA: Diagnosis not present

## 2021-10-05 DIAGNOSIS — H2521 Age-related cataract, morgagnian type, right eye: Secondary | ICD-10-CM | POA: Diagnosis not present

## 2021-10-05 DIAGNOSIS — H35372 Puckering of macula, left eye: Secondary | ICD-10-CM | POA: Diagnosis not present

## 2021-10-05 DIAGNOSIS — H35363 Drusen (degenerative) of macula, bilateral: Secondary | ICD-10-CM | POA: Diagnosis not present

## 2021-10-30 DIAGNOSIS — F17210 Nicotine dependence, cigarettes, uncomplicated: Secondary | ICD-10-CM | POA: Diagnosis not present

## 2021-10-30 DIAGNOSIS — R69 Illness, unspecified: Secondary | ICD-10-CM | POA: Diagnosis not present

## 2021-11-11 ENCOUNTER — Other Ambulatory Visit: Payer: Self-pay

## 2021-11-11 ENCOUNTER — Emergency Department (HOSPITAL_COMMUNITY): Payer: Medicare HMO

## 2021-11-11 ENCOUNTER — Emergency Department (HOSPITAL_COMMUNITY)
Admission: EM | Admit: 2021-11-11 | Discharge: 2021-11-11 | Disposition: A | Discharge status: Home / Self Care | Payer: Medicare HMO | Attending: Emergency Medicine | Admitting: Emergency Medicine

## 2021-11-11 DIAGNOSIS — Z79899 Other long term (current) drug therapy: Secondary | ICD-10-CM | POA: Insufficient documentation

## 2021-11-11 DIAGNOSIS — R55 Syncope and collapse: Secondary | ICD-10-CM | POA: Diagnosis not present

## 2021-11-11 DIAGNOSIS — R079 Chest pain, unspecified: Secondary | ICD-10-CM | POA: Diagnosis not present

## 2021-11-11 DIAGNOSIS — N390 Urinary tract infection, site not specified: Secondary | ICD-10-CM | POA: Diagnosis not present

## 2021-11-11 DIAGNOSIS — R42 Dizziness and giddiness: Secondary | ICD-10-CM | POA: Diagnosis not present

## 2021-11-11 DIAGNOSIS — R0602 Shortness of breath: Secondary | ICD-10-CM | POA: Diagnosis not present

## 2021-11-11 DIAGNOSIS — Z20822 Contact with and (suspected) exposure to covid-19: Secondary | ICD-10-CM | POA: Insufficient documentation

## 2021-11-11 LAB — CBC WITH DIFFERENTIAL/PLATELET
Abs Immature Granulocytes: 0.05 10*3/uL (ref 0.00–0.07)
Basophils Absolute: 0 10*3/uL (ref 0.0–0.1)
Basophils Relative: 0 %
Eosinophils Absolute: 0 10*3/uL (ref 0.0–0.5)
Eosinophils Relative: 0 %
HCT: 37 % (ref 36.0–46.0)
Hemoglobin: 12.1 g/dL (ref 12.0–15.0)
Immature Granulocytes: 1 %
Lymphocytes Relative: 14 %
Lymphs Abs: 1.4 10*3/uL (ref 0.7–4.0)
MCH: 30.9 pg (ref 26.0–34.0)
MCHC: 32.7 g/dL (ref 30.0–36.0)
MCV: 94.4 fL (ref 80.0–100.0)
Monocytes Absolute: 0.6 10*3/uL (ref 0.1–1.0)
Monocytes Relative: 6 %
Neutro Abs: 7.9 10*3/uL — ABNORMAL HIGH (ref 1.7–7.7)
Neutrophils Relative %: 79 %
Platelets: 259 10*3/uL (ref 150–400)
RBC: 3.92 MIL/uL (ref 3.87–5.11)
RDW: 12.9 % (ref 11.5–15.5)
WBC: 10 10*3/uL (ref 4.0–10.5)
nRBC: 0 % (ref 0.0–0.2)

## 2021-11-11 LAB — URINALYSIS, ROUTINE W REFLEX MICROSCOPIC
Bilirubin Urine: NEGATIVE
Glucose, UA: NEGATIVE mg/dL
Hgb urine dipstick: NEGATIVE
Ketones, ur: 20 mg/dL — AB
Nitrite: NEGATIVE
Protein, ur: 30 mg/dL — AB
Specific Gravity, Urine: 1.013 (ref 1.005–1.030)
pH: 5 (ref 5.0–8.0)

## 2021-11-11 LAB — COMPREHENSIVE METABOLIC PANEL
ALT: 15 U/L (ref 0–44)
AST: 20 U/L (ref 15–41)
Albumin: 3.9 g/dL (ref 3.5–5.0)
Alkaline Phosphatase: 36 U/L — ABNORMAL LOW (ref 38–126)
Anion gap: 9 (ref 5–15)
BUN: 20 mg/dL (ref 8–23)
CO2: 27 mmol/L (ref 22–32)
Calcium: 9.3 mg/dL (ref 8.9–10.3)
Chloride: 103 mmol/L (ref 98–111)
Creatinine, Ser: 1.01 mg/dL — ABNORMAL HIGH (ref 0.44–1.00)
GFR, Estimated: 59 mL/min — ABNORMAL LOW (ref >60)
Glucose, Bld: 111 mg/dL — ABNORMAL HIGH (ref 70–99)
Potassium: 4.6 mmol/L (ref 3.5–5.1)
Sodium: 139 mmol/L (ref 135–145)
Total Bilirubin: 0.6 mg/dL (ref 0.3–1.2)
Total Protein: 6.7 g/dL (ref 6.5–8.1)

## 2021-11-11 LAB — TROPONIN I (HIGH SENSITIVITY)
Troponin I (High Sensitivity): 12 ng/L (ref <18)
Troponin I (High Sensitivity): 14 ng/L (ref <18)

## 2021-11-11 LAB — RESP PANEL BY RT-PCR (FLU A&B, COVID) ARPGX2
Influenza A by PCR: NEGATIVE
Influenza B by PCR: NEGATIVE
SARS Coronavirus 2 by RT PCR: NEGATIVE

## 2021-11-11 LAB — CBG MONITORING, ED: Glucose-Capillary: 105 mg/dL — ABNORMAL HIGH (ref 70–99)

## 2021-11-11 MED ORDER — CEFTRIAXONE SODIUM 1 G IJ SOLR
1.0000 g | Freq: Once | INTRAMUSCULAR | Status: AC
Start: 2021-11-11 — End: 2021-11-11
  Administered 2021-11-11: 1 g via INTRAVENOUS
  Filled 2021-11-11: qty 10

## 2021-11-11 MED ORDER — CEPHALEXIN 500 MG PO CAPS: 500.0000 mg | ORAL_CAPSULE | Freq: Four times a day (QID) | ORAL | 0 refills | Status: DC

## 2021-11-11 MED ORDER — SODIUM CHLORIDE 0.9 % IV BOLUS
1000.0000 mL | Freq: Once | INTRAVENOUS | Status: AC
  Administered 2021-11-11: 1000 mL via INTRAVENOUS

## 2021-11-11 NOTE — ED Triage Notes (Signed)
Pt here POV d/t near syncopal episode. Pt was cleaning when episode occurred. Pt went to walk in clinic and sent here.. Endorses cold chills, weakness. VSS in triage. Alert and oriented X4.  ?

## 2021-11-11 NOTE — ED Provider Triage Note (Signed)
Emergency Medicine Provider Triage Evaluation Note ? ?Toney Reil , a 76 y.o. female  was evaluated in triage.  Pt complains of near syncopal event.  She reports that she was cleaning out the office and at about 1330 she had a near syncopal event.  She denies any concern for injuries from the near syncope.  She didn't take time to eat today.   ? ?She denies any new pain.  No headache.  No changes in blood pressure medications.  ? ?Physical Exam  ?BP 138/67 (BP Location: Left Arm)   Pulse 86   Temp 98 ?F (36.7 ?C) (Oral)   Resp 14   SpO2 99%  ?Gen:   Awake, no distress   ?Resp:  Normal effort  ?MSK:   Moves extremities without difficulty  ?Other:  Normal speech.  ? ?Medical Decision Making  ?Medically screening exam initiated at 5:15 PM.  Appropriate orders placed.  BRYNA RAZAVI was informed that the remainder of the evaluation will be completed by another provider, this initial triage assessment does not replace that evaluation, and the importance of remaining in the ED until their evaluation is complete. ? ? ?  ?Lorin Glass, [place] ?11/11/21 1716 ? ?

## 2021-11-11 NOTE — ED Provider Notes (Signed)
[facility] EMERGENCY DEPARTMENT Provider Note   CSN: [id number] Arrival date & time: 11/11/21  1618     History  Chief Complaint  Patient presents with   Near Syncope    Victoria Blair is a 76 y.o. female presented emerged part near syncope.  The patient reports that she was in her usual state of health today, the afternoon began having "shaking chills" she was at work and says that she suddenly became very lightheaded, she fell down and lost consciousness.  This may have been unconscious for maybe a second or 2, no prolonged syncope.  She says she had 1 similar episode many years ago, but does not otherwise have a history of syncope.  She denies history of arrhythmia, cardiac disease, MI.  She denies history of stroke or TIA.  She denies dysuria but has a history of UTIs.  She has never had a cardiac  HPI     Home Medications Prior to Admission medications   Medication Sig Start Date End Date Taking? Authorizing Provider  Calcium Carbonate-Vitamin D (CALCIUM + D PO) Take 1 tablet by mouth daily.     [provider]  fish oil-omega-3 fatty acids 1000 MG capsule Take 1 g by mouth daily.     [provider]  levothyroxine (SYNTHROID) 100 MCG tablet Take 1 tablet (100 mcg total) by mouth daily. 05/25/21   Reather Littler, MD  lisinopril (ZESTRIL) 5 MG tablet TAKE 1 TABLET BY MOUTH EVERY DAY 04/14/21   Reather Littler, MD  meloxicam (MOBIC) 7.5 MG tablet TAKE 1 TABLET (7.5 MG TOTAL) BY MOUTH DAILY. Patient not taking: Reported on 08/29/2021 06/10/16   Reather Littler, MD  mirtazapine (REMERON) 15 MG tablet Take 15 mg by mouth at bedtime. Patient not taking: Reported on 05/25/2021 07/29/20   [provider]  VITAMIN E PO Take 1 tablet by mouth daily.    [provider]      Allergies    Hydrocodone-acetaminophen and Erythromycin base    Review of Systems   Review of Systems  Physical Exam Updated Vital Signs BP 133/66   Pulse 78    Temp 98 F (36.7 C) (Oral)   Resp 17   SpO2 99%  Physical Exam Constitutional:      General: She is not in acute distress. HENT:     Head: Normocephalic and atraumatic.  Eyes:     Conjunctiva/sclera: Conjunctivae normal.     Pupils: Pupils are equal, round, and reactive to light.  Cardiovascular:     Rate and Rhythm: Normal rate and regular rhythm.  Pulmonary:     Effort: Pulmonary effort is normal. No respiratory distress.  Abdominal:     General: There is no distension.     Tenderness: There is no abdominal tenderness. There is no guarding.  Skin:    General: Skin is warm and dry.  Neurological:     General: No focal deficit present.     Mental Status: She is alert. Mental status is at baseline.  Psychiatric:        Mood and Affect: Mood normal.        Behavior: Behavior normal.    ED Results / Procedures / Treatments   Labs (all labs ordered are listed, but only abnormal results are displayed) Labs Reviewed  CBC WITH DIFFERENTIAL/PLATELET - Abnormal; Notable for the following components:      Result Value   Neutro Abs 7.9 (*)    All other components within  normal limits  COMPREHENSIVE METABOLIC PANEL - Abnormal; Notable for the following components:   Glucose, Bld 111 (*)    Creatinine, Ser 1.01 (*)    Alkaline Phosphatase 36 (*)    GFR, Estimated 59 (*)    All other components within normal limits  CBG MONITORING, ED - Abnormal; Notable for the following components:   Glucose-Capillary 105 (*)    All other components within normal limits  RESP PANEL BY RT-PCR (FLU A&B, COVID) ARPGX2  CULTURE, BLOOD (SINGLE)  URINE CULTURE  URINALYSIS, ROUTINE W REFLEX MICROSCOPIC  TROPONIN I (HIGH SENSITIVITY)  TROPONIN I (HIGH SENSITIVITY)    EKG EKG Interpretation  Date/Time:  Saturday November 11 2021 20:51:07 EDT Ventricular Rate:  81 PR Interval:  151 QRS Duration: 92 QT Interval:  408 QTC Calculation: 474 R Axis:   88 Text Interpretation: Sinus rhythm Consider  right ventricular hypertrophy Nonspecific T abnrm, anterolateral leads Confirmed by Alvester Chou [phone]) on 11/11/2021 8:55:17 PM  Radiology DG Chest Portable 1 View  Result Date: 11/11/2021 CLINICAL DATA:  Shortness of breath EXAM: PORTABLE CHEST 1 VIEW COMPARISON:  12/09/2012 chest radiograph.  01/25/2017 chest CT FINDINGS: The cardiomediastinal silhouette is unremarkable. 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 There is no evidence of focal airspace disease, pulmonary edema, suspicious pulmonary nodule/mass, pleural effusion, or pneumothorax. No acute bony abnormalities are identified. Fractures of the LEFT 6th and 7th ribs appear remote but correlate clinically. IMPRESSION: Emphysema/COPD changes without evidence of acute cardiopulmonary disease. LEFT 6 and 7th rib fractures, appear remote but correlate pain. Electronically Signed   By: Harmon Pier M.D.   On: 11/11/2021 20:25    Procedures Procedures    Medications Ordered in ED Medications  cefTRIAXone (ROCEPHIN) 1 g in sodium chloride 0.9 % 100 mL IVPB (1 g Intravenous New Bag/Given 11/11/21 2121)  sodium chloride 0.9 % bolus 1,000 mL (1,000 mLs Intravenous New Bag/Given 11/11/21 2047)    ED Course/ Medical Decision Making/ A&P Clinical Course as of 11/11/21 2124  Sat Nov 11, 2021  2107 Patient remains asymptomatic.  She is adamant that she needs to go home tonight to take care of her dog, does not want to stay in the hospital.  We discussed my concerns for possible sepsis or bacteremia, with the 2 remaining potential sources being urine (she is going to provide a urine sample shortly) or seeded dental infection given that she had dental procedures done about a week ago.  She is on amoxicillin currently.  I would start with some IV Rocephin here in the ED while she is still with [place].  If the UA is positive anticipate discharge with Keflex at home.  If the UA is negative for infection, I would treat otherwise with Augmentin for broader  coverage of oral flora.  I explained to the patient that if her blood culture is positive she will receive a phone call will need to come back immediately for IV antibiotics.  She verbalized understanding.  She does live with her sister who can keep an eye on her tonight.  She will be resting at home the next several days, no work, no driving. [MT]  2109 She understands the risks of leaving at this time, including worsening bacterial infection, arrhythmia, syncope, and death. [MT]    Clinical Course User Index [MT] Kalianne Fetting, Kermit Balo, MD                           Medical Decision Making  Amount and/or Complexity of Data Reviewed Labs: ordered. Radiology: ordered.   This patient presents to the ED with concern for near syncope. This involves an extensive number of treatment options, and is a complaint that carries with it a high risk of complications and morbidity.  The differential diagnosis includes arrhythmia versus infection versus dehydration.  Additional history obtained from patient sister at the bedside  I ordered and personally interpreted labs.  The pertinent results include: White blood cell count is on the high end of normal, typically the patient runs around 4000, today is 10,000.  Troponin is unremarkable.  Being he is within normal limits.  I ordered imaging studies including x-ray of the chest I independently visualized and interpreted imaging which showed no focal infiltrates; old rib fx (pt confirms these aren't new, and no chest pain) I agree with the radiologist interpretation  The patient was maintained on a cardiac monitor.  I personally viewed and interpreted the cardiac monitored which showed an underlying rhythm of: NSR  Per my interpretation the patient's ECG shows sinus rhythm without acute ischemic  I ordered medication including IV fluid bolus, IV rocephin for possible UTI I have reviewed the patients home medicines and have made adjustments as needed  Test  Considered:  -Overall with fairly low clinical suspicion for large PE causing syncope or near syncope given that the patient has negative troponin, no tachycardia, no hypoxia.  I do not believe she needs a stat emergent CT PE   After the interventions noted above, I reevaluated the patient and found that they have: improved  Dispostion:  After consideration of the diagnostic results and the patients response to treatment, I feel that the patent would benefit from close outpatient follow up.         Final Clinical Impression(s) / ED Diagnoses Final diagnoses:  Near syncope    Rx / DC Orders ED Discharge Orders     None         Terald Sleeper, MD 11/11/21 2125

## 2021-11-11 NOTE — ED Provider Notes (Signed)
Care assumed from Dr. Langston Masker at shift change, please see their notes for full documentation of patient's complaint/HPI. Briefly, pt here with complaint of shaking chills and syncope. Results so far show negative CXR and reassuring labwork. Awaiting urinalysis at this time to assess for source of infection. Pt is adamant she does not want admission. Plan is to dispo accordingly - if urine suspicious for infection cover with keflex. If negative pt to be discharged with augment as she has head recent dental procedure.  ? ?Physical Exam  ?BP 132/63   Pulse 90   Temp 98 ?F (36.7 ?C) (Oral)   Resp (!) 24   SpO2 100%  ? ?Physical Exam ?Vitals and nursing note reviewed.  ?Constitutional:   ?   Appearance: She is not ill-appearing.  ?HENT:  ?   Head: Normocephalic and atraumatic.  ?Eyes:  ?   Conjunctiva/sclera: Conjunctivae normal.  ?Cardiovascular:  ?   Rate and Rhythm: Normal rate and regular rhythm.  ?Pulmonary:  ?   Effort: Pulmonary effort is normal.  ?   Breath sounds: Normal breath sounds.  ?Skin: ?   General: Skin is warm and dry.  ?   Coloration: Skin is not jaundiced.  ?Neurological:  ?   Mental Status: She is alert.  ? ? ?Procedures  ?Procedures ? ?ED Course / MDM  ? ?Clinical Course as of 11/11/21 2228  ?Sat Nov 11, 2021  ?2107 Patient remains asymptomatic.  She is adamant that she needs to go home tonight to take care of her dog, does not want to stay in the hospital.  We discussed my concerns for possible sepsis or bacteremia, with the 2 remaining potential sources being urine (she is going to provide a urine sample shortly) or seeded dental infection given that she had dental procedures done about a week ago.  She is on amoxicillin currently.  I would start with some IV Rocephin here in the ED while she is still with [place].  If the UA is positive anticipate discharge with Keflex at home.  If the UA is negative for infection, I would treat otherwise with Augmentin for broader coverage of oral flora.  I  explained to the patient that if her blood culture is positive she will receive a phone call will need to come back immediately for IV antibiotics.  She verbalized understanding.  She does live with her sister who can keep an eye on her tonight.  She will be resting at home the next several days, no work, no driving. [MT]  ?2109 She understands the risks of leaving at this time, including worsening bacterial infection, arrhythmia, syncope, and death. [MT]  ?  ?Clinical Course User Index ?[MT] Wyvonnia Dusky, MD  ? ?Medical Decision Making ?Amount and/or Complexity of Data Reviewed ?Labs: ordered. ?   Details: Urinalysis with small leuks, 6-10 WBCs, rare bacteria with hyaline casts. Does have squamous epithelial cells however will treat for UTI at this time. Culture sent. ?Radiology: ordered. ? ? ? ? ? ? ? ?  ?Eustaquio Maize, PA-C ?11/11/21 2228 ? ?  ?Noemi Chapel, MD ?11/12/21 1452 ? ?

## 2021-11-11 NOTE — ED Notes (Signed)
DC instructions reviewed with pt. PT verbalized understanding. PT DC °

## 2021-11-11 NOTE — Discharge Instructions (Addendum)
You were treated in the ER today for an episode of loss of consciousness.  I explained that my concerns would be for a possible serious infection, including a blood infection, or a cardiac condition or heart arrhythmia that caused this.  I have recommended that we keep you in the hospital for monitoring and possible further testing.  You told me did not want to stay in the hospital.  You understand the risks of leaving, which could include worsening infection, loss of consciousness, stroke, and death.  It will immediately with your sister can help keep an eye on you.  I would advise that you stay home the next several days, rest, keep yourself hydrated, avoid driving or any physically exerting activities. ? ?If your blood cultures returned positive for bacteria, you will receive a phone call telling you need to come back to the hospital immediately for IV antibiotics. ? ?If you begin to feel sicker at home, including lightheadedness, loss of consciousness, uncontrollable shaking chills, you should also come back to the hospital, as this could be signs of a more serious condition. ? ?We prescribed antibiotics which you will take at home for the next several days until you complete the course for possible urinary tract infection. ? ?Finally, I recommend that you follow-up with your primary care doctor as soon as possible.  You likely need an echocardiogram or an ultrasound of your heart to make sure that there is no issue with your heart that could cause lightheadedness or loss of consciousness.  Either your doctor can order that or you will need to make an appointment with the cardiologist at the heart care number above. ?

## 2021-11-13 LAB — URINE CULTURE

## 2021-11-16 ENCOUNTER — Inpatient Hospital Stay: Payer: Medicare HMO | Attending: Hematology and Oncology | Admitting: Dietician

## 2021-11-16 ENCOUNTER — Other Ambulatory Visit: Payer: Self-pay

## 2021-11-16 LAB — CULTURE, BLOOD (SINGLE)
Culture: NO GROWTH
Special Requests: ADEQUATE

## 2021-11-16 NOTE — Progress Notes (Signed)
Nutrition ? ?Patient attended Nutrition 101 this afternoon.  ? ?Timiyah Romito B. Zenia Resides, RD, LDN ?Registered Dietitian ?[phone] ? ?

## 2021-11-19 ENCOUNTER — Other Ambulatory Visit: Payer: Self-pay | Admitting: Endocrinology

## 2021-11-19 DIAGNOSIS — I10 Essential (primary) hypertension: Secondary | ICD-10-CM

## 2021-11-19 DIAGNOSIS — E890 Postprocedural hypothyroidism: Secondary | ICD-10-CM

## 2021-11-20 ENCOUNTER — Other Ambulatory Visit: Payer: Medicare HMO

## 2021-11-20 ENCOUNTER — Other Ambulatory Visit: Payer: Self-pay

## 2021-11-20 ENCOUNTER — Other Ambulatory Visit (INDEPENDENT_AMBULATORY_CARE_PROVIDER_SITE_OTHER): Payer: Medicare HMO

## 2021-11-20 DIAGNOSIS — I10 Essential (primary) hypertension: Secondary | ICD-10-CM | POA: Diagnosis not present

## 2021-11-20 DIAGNOSIS — E890 Postprocedural hypothyroidism: Secondary | ICD-10-CM

## 2021-11-20 LAB — BASIC METABOLIC PANEL
BUN: 22 mg/dL (ref 6–23)
CO2: 32 mEq/L (ref 19–32)
Calcium: 9 mg/dL (ref 8.4–10.5)
Chloride: 104 mEq/L (ref 96–112)
Creatinine, Ser: 0.94 mg/dL (ref 0.40–1.20)
GFR: 60.1 mL/min (ref >60.00)
Glucose, Bld: 147 mg/dL — ABNORMAL HIGH (ref 70–99)
Potassium: 3.8 mEq/L (ref 3.5–5.1)
Sodium: 142 mEq/L (ref 135–145)

## 2021-11-21 LAB — TSH: TSH: 0.06 u[IU]/mL — ABNORMAL LOW (ref 0.35–5.50)

## 2021-11-22 ENCOUNTER — Encounter: Payer: Self-pay | Admitting: Endocrinology

## 2021-11-22 ENCOUNTER — Other Ambulatory Visit: Payer: Self-pay

## 2021-11-22 ENCOUNTER — Ambulatory Visit (INDEPENDENT_AMBULATORY_CARE_PROVIDER_SITE_OTHER): Payer: Medicare HMO | Admitting: Endocrinology

## 2021-11-22 VITALS — BP 132/78 | HR 77 | Ht 67.0 in | Wt 114.2 lb

## 2021-11-22 DIAGNOSIS — I10 Essential (primary) hypertension: Secondary | ICD-10-CM | POA: Diagnosis not present

## 2021-11-22 DIAGNOSIS — E890 Postprocedural hypothyroidism: Secondary | ICD-10-CM | POA: Diagnosis not present

## 2021-11-22 DIAGNOSIS — M810 Age-related osteoporosis without current pathological fracture: Secondary | ICD-10-CM

## 2021-11-22 NOTE — Progress Notes (Signed)
? ?       ? ? ? ?Patient ID: Victoria Blair, female   DOB: Mar 28, 1946, 76 y.o.   MRN: [id number] ? ? ? ?Reason for Appointment:  Follow-up visit for various problems ? ? History of Present Illness:  ? ? ?OSTEOPOROSIS; previously was given Actonel but stopped this because of difficulty with compliance.  ?T score at spine was -2.8 in October 2018 ? ?T score at the spine in 06/2019 was -3.1 and declined 4.6% ? ?Results from 06/2021: ?  Lumbar spine L1-L4 Femoral neck (FN) 33% distal radius  ?T-score -2.7 RFN: -1.9 ?LFN: -2.6 n/a  ?Change in BMD from previous DXA test (%) Up 5.9% Down 5.3% n/a  ? ? ?In her youth she was 5 feet 7-1/2 inches tall ? ?She previously had been taking Evista since 5/09.  This was restarted on her visit in 05/2018 ?Subsequently stopped this because of cost ? ?She has recently been treated with RECLAST: She had the first infusion in 07/2019 without any side effect ?She had a second infusion of 5 g Reclast on 08/31/2020 ?Most recent infusion was on 09/12/2021 ? ?Vitamin D deficiency: ?She is bone on calcium  with  vitamin D  ?She  recommended 2000 Units for her vitamin D deficiency ? ?Lab Results  ?Component Value Date  ? VD25OH 30.65 09/04/2021  ? VD25OH 48.76 03/27/2021  ? VD25OH 30.62 07/25/2020  ? VD25OH 20.40 (L) 12/28/2019  ? ? ? ?HYPOTHYROIDISM: This was first diagnosed in 1978 after I-131 treatment for Graves' disease   ?       ?Her dosages have been adjusted periodically ? ?Her dose was adjusted last in 8/22 when her TSH was relatively high ?She is now taking 100ug ? ?She does have more energy after increasing her levothyroxine  ?No recent fatigue ?Overall feels fairly good ? ?TSH which was back to normal is now suppressed ?She does not think she feels any palpitations or shakiness ? ?She is taking levothyroxine daily in the morning before breakfast as before without any vitamins and has not missed any doses ? ? ? ?Lab Results  ?Component Value Date  ? TSH 0.06 (L) 11/20/2021  ? TSH 2.17  09/04/2021  ? TSH 0.88 05/22/2021  ? FREET4 1.09 09/04/2021  ? FREET4 1.21 05/22/2021  ? FREET4 0.87 03/27/2021  ? ?' ? ?HYPERTENSION: She has been on treatment since 2009 and had previously been on Lotrel for quite some time with good control.  ? Blood pressure was low normal in 11/2017 and her Lotrel was stopped ? ?With taking lisinopril 5 mg daily blood pressure is controlled ?Does not check at home ?   ?BP Readings from Last 3 Encounters:  ?11/22/21 132/78  ?11/11/21 129/65  ?09/12/21 140/68  ? ? ? ?Allergies as of 11/22/2021   ? ?   Reactions  ? Hydrocodone-acetaminophen   ? Patient passed out with this medication  ? Erythromycin Base Other (See Comments)  ? Other  ? ?  ? ?  ?Medication List  ?  ? ?  ? Accurate as of November 22, 2021  9:36 AM. If you have any questions, ask your nurse or doctor.  ?  ?  ? ?  ? ?amoxicillin 500 MG capsule ?Commonly known as: AMOXIL ?Take 500 mg by mouth See admin instructions. Tid x 7 days ?  ?CALCIUM + D PO ?Take 1 tablet by mouth daily. ?  ?cephALEXin 500 MG capsule ?Commonly known as: KEFLEX ?Take 1 capsule (500 mg  total) by mouth 4 (four) times daily. ?  ?fish oil-omega-3 fatty acids 1000 MG capsule ?Take 1 g by mouth daily. ?  ?LEG CRAMPS PO ?Take 2 tablets by mouth 2 (two) times daily as needed (leg/hand cramps). ?  ?levothyroxine 100 MCG tablet ?Commonly known as: SYNTHROID ?Take 1 tablet (100 mcg total) by mouth daily. ?  ?lisinopril 5 MG tablet ?Commonly known as: ZESTRIL ?TAKE 1 TABLET BY MOUTH EVERY DAY ?  ?Probiotic Daily Caps ?Take 1 capsule by mouth daily. ?  ?VITAMIN E PO ?Take 1 tablet by mouth daily. Unsure of dose ?  ? ?  ? ? ?Past Medical History:  ?Diagnosis Date  ? Breast cancer ([place]) 1997  ? Depression   ? GERD (gastroesophageal reflux disease)   ? Graves disease   ? Hemorrhoids   ? Hypertension   ? Migraines   ? Osteoporosis   ? Personal history of radiation therapy 1998  ? Vitamin D deficiency   ? ? ?Past Surgical History:  ?Procedure Laterality Date  ? BREAST  BIOPSY Right 08/11/1996  ? BREAST LUMPECTOMY Right 1998  ? CESAREAN SECTION    ? CHOLECYSTECTOMY    ? SPINE SURGERY    ? ? ?Right breast cancer treated with lumpectomy and radiation 2000, lumbar spine surgery   ? ? ?Family History  ?Problem Relation Age of Onset  ? Heart disease Father   ? Pulmonary fibrosis Father   ? Hypertension Father   ? Pancreatic cancer Sister   ? Sleep apnea Son   ? Diabetes Maternal Aunt   ? ? ?Social History:  reports that she has been smoking cigarettes. She has a 12.00 pack-year smoking history. She has never used smokeless tobacco. She reports current alcohol use. She reports that she does not use drugs. ? ?Allergies:  ?Allergies  ?Allergen Reactions  ? Hydrocodone-Acetaminophen   ?  Patient passed out with this medication  ? Erythromycin Base Other (See Comments)  ?  Other  ? ? ?REVIEW of systems: ? ?Weight history: ?  ?Wt Readings from Last 3 Encounters:  ?11/22/21 114 lb 3.2 oz (51.8 kg)  ?05/25/21 105 lb 9.6 oz (47.9 kg)  ?03/30/21 103 lb (46.7 kg)  ? ? ? ?Hyperlipidemia: She has a persistently mild increase in LDL, unchanged ?Does not have any other risk factors ?LDL last below 130 ?  ?Lab Results  ?Component Value Date  ? CHOL 188 12/28/2019  ? CHOL 187 06/24/2019  ? CHOL 202 (H) 12/24/2018  ? ?Lab Results  ?Component Value Date  ? HDL 46.60 12/28/2019  ? HDL 48.20 06/24/2019  ? HDL 50.70 12/24/2018  ? ?Lab Results  ?Component Value Date  ? LDLCALC 124 (H) 12/28/2019  ? [place] 122 (H) 06/24/2019  ? LDLCALC 128 (H) 12/24/2018  ? ?Lab Results  ?Component Value Date  ? TRIG 90.0 12/28/2019  ? TRIG 84.0 06/24/2019  ? TRIG 116.0 12/24/2018  ? ?Lab Results  ?Component Value Date  ? CHOLHDL 4 12/28/2019  ? CHOLHDL 4 06/24/2019  ? CHOLHDL 4 12/24/2018  ? ?No results found for: LDLDIRECT ? ?     ? Examination: ?  ?BP 132/78   Pulse 77   Ht '5\' 7"'$  (1.702 m)   Wt 114 lb 3.2 oz (51.8 kg)   SpO2 99%   BMI 17.89 kg/m?  ? ?No tremor ?Biceps reflexes do not show any brisk relaxation ?Hands  not unusually warm ? ? ?  Assessments / PLAN: ? ? ?Osteoporosis:  ? ?Treated with  Reclast annually recently with improvement in spine bone density although slightly worse at the hip ?We will continue her annual treatment ? ? ?HYPOTHYROIDISM, post ablative, long-standing ?Although she has been on a higher levothyroxine dose since late 2022 even with her weight gain her TSH is now suppressed ?She is not symptomatic ?Since she has recent supply of the 100 mcg tablet she will take this 6 days a week now ?We will recheck in 2 months ? ?Weight loss: Resolved ? ?Vitamin D deficiency: She does had a low normal level and will need to take vitamin D3 4000 units daily ? ? ?Hypertension: Her blood pressure is fairly good today but she is going to be seen by a cardiologist because of recent near syncope episode although possibly related to a UTI and dehydration ? ? ?Elayne Snare ?11/22/2021, 9:36 AM  ? ?  ? ? ? ? ?  ? ? ?  ?

## 2021-11-22 NOTE — Patient Instructions (Addendum)
Leave off Synthroid once a week ? ?Double dose of Vitamin D3 ?

## 2021-11-23 ENCOUNTER — Encounter: Payer: Self-pay | Admitting: Internal Medicine

## 2021-11-23 ENCOUNTER — Ambulatory Visit (INDEPENDENT_AMBULATORY_CARE_PROVIDER_SITE_OTHER): Payer: Medicare HMO | Admitting: Internal Medicine

## 2021-11-23 VITALS — BP 120/62 | HR 89 | Ht 67.0 in | Wt 115.0 lb

## 2021-11-23 DIAGNOSIS — R55 Syncope and collapse: Secondary | ICD-10-CM | POA: Diagnosis not present

## 2021-11-23 DIAGNOSIS — Z853 Personal history of malignant neoplasm of breast: Secondary | ICD-10-CM

## 2021-11-23 DIAGNOSIS — I2584 Coronary atherosclerosis due to calcified coronary lesion: Secondary | ICD-10-CM

## 2021-11-23 DIAGNOSIS — I2510 Atherosclerotic heart disease of native coronary artery without angina pectoris: Secondary | ICD-10-CM | POA: Diagnosis not present

## 2021-11-23 DIAGNOSIS — I700 Atherosclerosis of aorta: Secondary | ICD-10-CM | POA: Diagnosis not present

## 2021-11-23 DIAGNOSIS — I25118 Atherosclerotic heart disease of native coronary artery with other forms of angina pectoris: Secondary | ICD-10-CM | POA: Insufficient documentation

## 2021-11-23 MED ORDER — ASPIRIN EC 81 MG PO TBEC: 81.0000 mg | DELAYED_RELEASE_TABLET | Freq: Every day | ORAL | 11 refills | Status: DC

## 2021-11-23 MED ORDER — METOPROLOL TARTRATE 100 MG PO TABS: ORAL_TABLET | ORAL | 0 refills | Status: DC

## 2021-11-23 MED ORDER — EZETIMIBE 10 MG PO TABS: 10.0000 mg | ORAL_TABLET | Freq: Every day | ORAL | 3 refills | Status: DC

## 2021-11-23 NOTE — Patient Instructions (Signed)
Medication Instructions:  ?Your physician has recommended you make the following change in your medication:  ?START: Aspirin 81 mg by mouth once daily ?START: ezetimibe (Zetia) 10 mg by mouth once daily  ? ?*If you need a refill on your cardiac medications before your next appointment, please call your pharmacy* ? ? ?Lab Work: ?NONE ?If you have labs (blood work) drawn today and your tests are completely normal, you will receive your results only by: ?MyChart Message (if you have MyChart) OR ?A paper copy in the mail ?If you have any lab test that is abnormal or we need to change your treatment, we will call you to review the results. ? ? ?Testing/Procedures: ?Your physician has requested that you have an echocardiogram. Echocardiography is a painless test that uses sound waves to create images of your heart. It provides your doctor with information about the size and shape of your heart and how well your heart?s chambers and valves are working. This procedure takes approximately one hour. There are no restrictions for this procedure. ? ? ?Your physician has requested that you have cardiac CT. Cardiac computed tomography (CT) is a painless test that uses an x-ray machine to take clear, detailed pictures of your heart. For further information please visit [email or web address]. Please follow instruction sheet as given. ?  ? ? ?Follow-Up: ?At [facility], you and your health needs are our priority.  As part of our continuing mission to provide you with exceptional heart care, we have created designated Provider Care Teams.  These Care Teams include your primary Cardiologist (physician) and Advanced Practice Providers (APPs -  Physician Assistants and Nurse Practitioners) who all work together to provide you with the care you need, when you need it. ? ?We recommend signing up for the patient portal called "MyChart".  Sign up information is provided on this After Visit Summary.  MyChart is used to connect with patients  for Virtual Visits (Telemedicine).  Patients are able to view lab/test results, encounter notes, upcoming appointments, etc.  Non-urgent messages can be sent to your provider as well.   ?To learn more about what you can do with MyChart, go to [email or web address].   ? ?Your next appointment:   ?6 -7 month(s) ? ?The format for your next appointment:   ?In Person ? ?Provider:   ?Victoria Lean, MD   ? ? ?Other Instructions ? ?Your cardiac CT will be scheduled at the below location:  ? ?[facility] ?[address] ?[place], [place] [address] ?[phone] ? ? ?At [facility], please arrive at the [facility] and Children's Entrance (Entrance C2) of [facility] 30 minutes prior to test start time. ?You can use the FREE valet parking offered at entrance C (encouraged to control the heart rate for the test)  ?Proceed to the [facility] Radiology Department (first floor) to check-in and test prep. ? ?All radiology patients and guests should use entrance C2 at [facility], accessed from [facility], even though the hospital's physical address listed is [address]. ? ? ? ? ?Please follow these instructions carefully (unless otherwise directed): ? ?On the Night Before the Test: ?Be sure to Drink plenty of water. ?Do not consume any caffeinated/decaffeinated beverages or chocolate 12 hours prior to your test. ?Do not take any antihistamines 12 hours prior to your test. ? ? ?On the Day of the Test: ?Drink plenty of water until 1 hour prior to the test. ?Do not eat any food  4 hours prior to the test. ?You may take your regular medications prior to the test.  ?Take metoprolol (Lopressor) 100 mg by mouth two hours prior to test. ?FEMALES- please wear underwire-free bra if available, avoid dresses & tight clothing ? ? ?     ?After the Test: ?Drink plenty of water. ?After receiving IV contrast, you may experience a mild flushed feeling. This is normal. ?On occasion,  you may experience a mild rash up to 24 hours after the test. This is not dangerous. If this occurs, you can take Benadryl 25 mg and increase your fluid intake. ?If you experience trouble breathing, this can be serious. If it is severe call 911 IMMEDIATELY. If it is mild, please call our office. ?If you take any of these medications: Glipizide/Metformin, Avandament, Glucavance, please do not take 48 hours after completing test unless otherwise instructed. ? ?We will call to schedule your test 2-4 weeks out understanding that some insurance companies will need an authorization prior to the service being performed.  ? ?For non-scheduling related questions, please contact the cardiac imaging nurse navigator should you have any questions/concerns: ?Marchia Bond, Cardiac Imaging Nurse Navigator ?Gordy Clement, Cardiac Imaging Nurse Navigator ?[facility] ?Direct Office Dial: [phone]  ? ?For scheduling needs, including cancellations and rescheduling, please call [place], [phone].   ?

## 2021-11-23 NOTE — Progress Notes (Signed)
?Cardiology Office Note:   ? ?Date:  11/23/2021  ? ?ID:  Victoria Blair, DOB 12-21-1945, MRN [id number] ? ?PCP:  Orpah Melter, MD ?  ?[facility] ?Cardiologist:  Werner Lean, MD    ? ?Referring MD: Orpah Melter, MD  ? ?CC: Passing out ?Consulted for the evaluation of near syncope at the behest of Orpah Melter, MD ? ?History of Present Illness:   ? ?Victoria Blair is a 76 y.o. female with a hx of HTN, tobacco abuse and Breast Cancer (radiation dose unclear) who presents after syncope. ? ?Patient notes that she is feeling now.  Has had no chest pain, chest pressure, chest tightness, chest stinging .  She notes that on a day she and her husband were suppose to work and clean that she hadn't eaten anything.  Felt weak with low BP.  Needed to lie down for fear of fainting. A friend put a pillow down for her.  Later went to urgent care  Was advised to go to ED- did not get admitted because BP resolved and she felt fine.   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  No PND or orthopnea.  No weight gain, leg swelling , or abdominal swelling.  Notes  no palpitations or funny heart beats.    ? ?Patient reports prior cardiac testing including Chest CT 2018 showing aortic atherosclerosis, significant 3vD CAC. ? ? ?Past Medical History:  ?Diagnosis Date  ? Breast cancer ([place]) 1997  ? Depression   ? GERD (gastroesophageal reflux disease)   ? Graves disease   ? Hemorrhoids   ? Hypertension   ? Migraines   ? Osteoporosis   ? Personal history of radiation therapy 1998  ? Vitamin D deficiency   ? ? ?Past Surgical History:  ?Procedure Laterality Date  ? BREAST BIOPSY Right 08/11/1996  ? BREAST LUMPECTOMY Right 1998  ? CESAREAN SECTION    ? CHOLECYSTECTOMY    ? SPINE SURGERY    ? ? ?Current Medications: ?Current Meds  ?Medication Sig  ? aspirin EC 81 MG tablet Take 1 tablet (81 mg total) by  mouth daily. Swallow whole.  ? Calcium Carbonate-Vitamin D (CALCIUM + D PO) Take 1 tablet by mouth daily.   ? ezetimibe (ZETIA) 10 MG tablet Take 1 tablet (10 mg total) by mouth daily.  ? fish oil-omega-3 fatty acids 1000 MG capsule Take 1 g by mouth daily.   ? Homeopathic Products (LEG CRAMPS PO) Take 2 tablets by mouth 2 (two) times daily as needed (leg/hand cramps).  ? levothyroxine (SYNTHROID) 100 MCG tablet Take 1 tablet (100 mcg total) by mouth daily. (Patient taking differently: Take 100 mcg by mouth daily. Take every day except Thurs)  ? lisinopril (ZESTRIL) 5 MG tablet TAKE 1 TABLET BY MOUTH EVERY DAY  ? metoprolol tartrate (LOPRESSOR) 100 MG tablet Take 2 hours prior to Cardiac CT  ? nicotine polacrilex (COMMIT) 4 MG lozenge as needed for smoking cessation.  ? Probiotic Product (PROBIOTIC DAILY) CAPS Take 1 capsule by mouth daily.  ? VITAMIN E PO Take 1 tablet by mouth daily. Unsure of dose  ?  ? ?Allergies:   Hydrocodone-acetaminophen and Erythromycin base  ? ?Social History  ? ?Socioeconomic History  ? Marital status: Divorced  ?  Spouse name: Not on file  ? Number of children: Not on file  ? Years  of education: Not on file  ? Highest education level: Not on file  ?Occupational History  ? Not on file  ?Tobacco Use  ? Smoking status: Every Day  ?  Packs/day: 0.30  ?  Years: 40.00  ?  Pack years: 12.00  ?  Types: Cigarettes  ? Smokeless tobacco: Never  ? Tobacco comments:  ?  trying to quit. Pt states she is smoking about 5 cigarettes a day  ?Substance and Sexual Activity  ? Alcohol use: Yes  ?  Alcohol/week: 0.0 standard drinks  ?  Comment: seldom  ? Drug use: No  ? Sexual activity: Yes  ?Other Topics Concern  ? Not on file  ?Social History Narrative  ? ** Merged History Encounter **  ?    ? ?Social Determinants of Health  ? ?Financial Resource Strain: Not on file  ?Food Insecurity: Not on file  ?Transportation Needs: Not on file  ?Physical Activity: Not on file  ?Stress: Not on file  ?Social Connections:  Not on file  ?  ? ?Family History: ?The patient's family history includes Diabetes in her maternal aunt; Heart disease in her father; Hypertension in her father; Pancreatic cancer in her sister; Pulmonary fibrosis in her father; Sleep apnea in her son. ? ?ROS:   ?Please see the history of present illness.    ? All other systems reviewed and are negative. ? ?EKGs/Labs/Other Studies Reviewed:   ? ?Recent Labs: ?11/11/2021: ALT 15; Hemoglobin 12.1; Platelets 259 ?11/20/2021: BUN 22; Creatinine, Ser 0.94; Potassium 3.8; Sodium 142; TSH 0.06  ?Recent Lipid Panel ?   ?Component Value Date/Time  ? CHOL 188 12/28/2019 0820  ? TRIG 90.0 12/28/2019 0820  ? HDL 46.60 12/28/2019 0820  ? CHOLHDL 4 12/28/2019 0820  ? VLDL 18.0 12/28/2019 0820  ? [place] 124 (H) 12/28/2019 0820  ?    ? ?Physical Exam:   ? ?VS:  BP 120/62   Pulse 89   Ht '5\' 7"'$  (1.702 m)   Wt 115 lb (52.2 kg)   SpO2 97%   BMI 18.01 kg/m?    ? ?Wt Readings from Last 3 Encounters:  ?11/23/21 115 lb (52.2 kg)  ?11/22/21 114 lb 3.2 oz (51.8 kg)  ?05/25/21 105 lb 9.6 oz (47.9 kg)  ?  ?Orthostatic Vitals: ?Supine:  BP 115/60  HR 91 ?Sitting:  BP 122/62  HR 93 ?Standing:  BP 117/63  HR 97 ?Prolonged Stand:  BP 116/62  HR 97 ? ? ?GEN:  Well nourished, well developed in no acute distress ?HEENT: Normal ?NECK: No JVD; No carotid bruits ?LYMPHATICS: No lymphadenopathy ?CARDIAC: RRR, no murmurs, rubs, gallops ?RESPIRATORY:  Clear to auscultation without rales, wheezing or rhonchi  ?ABDOMEN: Soft, non-tender, non-distended ?MUSCULOSKELETAL:  No edema; No deformity  ?SKIN: Warm and dry ?NEUROLOGIC:  Alert and oriented x 3 ?PSYCHIATRIC:  Normal affect  ? ?ASSESSMENT:   ? ?1. Near syncope   ?2. Aortic atherosclerosis ([place])   ?3. Coronary artery calcification   ?4. BREAST CANCER, HX OF   ? ?PLAN:   ? ?Near syncope ?Breast Cancer with prior radiation ?Tobacco abuse- discussed cessation ?HLD with prior statin myopathy ?- Reviewed CT with patient ?- may be related to not eating but  given risks will r/o cardiac cause ?-CCTA and Echo ?- start ASA ?- start zetia 10 ?- if negative studies will see in Fall for prevention of cardiac disease ? ?   ? ? ?Medication Adjustments/Labs and Tests Ordered: ?Current medicines are reviewed at length with the  patient today.  Concerns regarding medicines are outlined above.  ?Orders Placed This Encounter  ?Procedures  ? CT CORONARY MORPH W/CTA COR W/SCORE W/CA W/CM &/OR WO/CM  ? ECHOCARDIOGRAM COMPLETE  ? ?Meds ordered this encounter  ?Medications  ? metoprolol tartrate (LOPRESSOR) 100 MG tablet  ?  Sig: Take 2 hours prior to Cardiac CT  ?  Dispense:  1 tablet  ?  Refill:  0  ? aspirin EC 81 MG tablet  ?  Sig: Take 1 tablet (81 mg total) by mouth daily. Swallow whole.  ?  Dispense:  30 tablet  ?  Refill:  11  ? ezetimibe (ZETIA) 10 MG tablet  ?  Sig: Take 1 tablet (10 mg total) by mouth daily.  ?  Dispense:  90 tablet  ?  Refill:  3  ? ? ?Patient Instructions  ?Medication Instructions:  ?Your physician has recommended you make the following change in your medication:  ?START: Aspirin 81 mg by mouth once daily ?START: ezetimibe (Zetia) 10 mg by mouth once daily  ? ?*If you need a refill on your cardiac medications before your next appointment, please call your pharmacy* ? ? ?Lab Work: ?NONE ?If you have labs (blood work) drawn today and your tests are completely normal, you will receive your results only by: ?MyChart Message (if you have MyChart) OR ?A paper copy in the mail ?If you have any lab test that is abnormal or we need to change your treatment, we will call you to review the results. ? ? ?Testing/Procedures: ?Your physician has requested that you have an echocardiogram. Echocardiography is a painless test that uses sound waves to create images of your heart. It provides your doctor with information about the size and shape of your heart and how well your heart?s chambers and valves are working. This procedure takes approximately one hour. There are no  restrictions for this procedure. ? ? ?Your physician has requested that you have cardiac CT. Cardiac computed tomography (CT) is a painless test that uses an x-ray machine to take clear, detailed pictures

## 2021-11-24 ENCOUNTER — Other Ambulatory Visit: Payer: Self-pay | Admitting: Endocrinology

## 2021-12-11 ENCOUNTER — Ambulatory Visit (HOSPITAL_BASED_OUTPATIENT_CLINIC_OR_DEPARTMENT_OTHER): Payer: Medicare HMO

## 2021-12-11 ENCOUNTER — Telehealth (HOSPITAL_COMMUNITY): Payer: Self-pay | Admitting: *Deleted

## 2021-12-11 DIAGNOSIS — I2584 Coronary atherosclerosis due to calcified coronary lesion: Secondary | ICD-10-CM | POA: Diagnosis present

## 2021-12-11 DIAGNOSIS — I700 Atherosclerosis of aorta: Secondary | ICD-10-CM

## 2021-12-11 DIAGNOSIS — I2510 Atherosclerotic heart disease of native coronary artery without angina pectoris: Secondary | ICD-10-CM | POA: Insufficient documentation

## 2021-12-11 DIAGNOSIS — R55 Syncope and collapse: Secondary | ICD-10-CM | POA: Diagnosis not present

## 2021-12-11 DIAGNOSIS — R931 Abnormal findings on diagnostic imaging of heart and coronary circulation: Secondary | ICD-10-CM | POA: Diagnosis present

## 2021-12-11 LAB — ECHOCARDIOGRAM COMPLETE
Area-P 1/2: 3.42 cm2
P 1/2 time: 383 msec
S' Lateral: 2.1 cm

## 2021-12-11 NOTE — Telephone Encounter (Signed)
Attempted to call patient regarding upcoming cardiac CT appointment. °Left message on voicemail with name and callback number ° °Shauntee Karp RN Navigator Cardiac Imaging °[facility] °[phone] Office °[phone] Cell ° °

## 2021-12-12 ENCOUNTER — Telehealth: Payer: Self-pay | Admitting: Internal Medicine

## 2021-12-12 ENCOUNTER — Encounter (HOSPITAL_COMMUNITY): Payer: Self-pay

## 2021-12-12 ENCOUNTER — Ambulatory Visit (HOSPITAL_COMMUNITY)
Admission: RE | Admit: 2021-12-12 | Discharge: 2021-12-12 | Disposition: A | Discharge status: Home / Self Care | Payer: Medicare HMO | Source: Ambulatory Visit | Attending: Cardiology | Admitting: Cardiology

## 2021-12-12 ENCOUNTER — Ambulatory Visit (HOSPITAL_COMMUNITY)
Admission: RE | Admit: 2021-12-12 | Discharge: 2021-12-12 | Disposition: A | Discharge status: Home / Self Care | Payer: Medicare HMO | Source: Ambulatory Visit | Attending: Internal Medicine | Admitting: Internal Medicine

## 2021-12-12 ENCOUNTER — Other Ambulatory Visit: Payer: Self-pay | Admitting: Cardiology

## 2021-12-12 DIAGNOSIS — I959 Hypotension, unspecified: Secondary | ICD-10-CM | POA: Diagnosis not present

## 2021-12-12 DIAGNOSIS — R55 Syncope and collapse: Secondary | ICD-10-CM

## 2021-12-12 DIAGNOSIS — R931 Abnormal findings on diagnostic imaging of heart and coronary circulation: Secondary | ICD-10-CM

## 2021-12-12 DIAGNOSIS — I2510 Atherosclerotic heart disease of native coronary artery without angina pectoris: Secondary | ICD-10-CM

## 2021-12-12 DIAGNOSIS — I2584 Coronary atherosclerosis due to calcified coronary lesion: Secondary | ICD-10-CM | POA: Diagnosis not present

## 2021-12-12 DIAGNOSIS — R42 Dizziness and giddiness: Secondary | ICD-10-CM | POA: Diagnosis not present

## 2021-12-12 DIAGNOSIS — I700 Atherosclerosis of aorta: Secondary | ICD-10-CM | POA: Diagnosis not present

## 2021-12-12 MED ORDER — NITROGLYCERIN 0.4 MG SL SUBL
SUBLINGUAL_TABLET | SUBLINGUAL | Status: AC
  Administered 2021-12-12: 0.8 mg via SUBLINGUAL
  Filled 2021-12-12: qty 2

## 2021-12-12 MED ORDER — IOHEXOL 350 MG/ML SOLN
100.0000 mL | Freq: Once | INTRAVENOUS | Status: AC | PRN
  Administered 2021-12-12: 100 mL via INTRAVENOUS

## 2021-12-12 MED ORDER — NITROGLYCERIN 0.4 MG SL SUBL: 0.8000 mg | SUBLINGUAL_TABLET | Freq: Once | SUBLINGUAL | Status: AC

## 2021-12-12 NOTE — Telephone Encounter (Signed)
Called Patient on regard to test results ?Discussed  ?- Echo (normal) ?- CT Mild non obstructive CAD, oRCA ~ 50% unable to model FFR ?- patient notes feeling dizzy today because she did not eat. ?- no further syncope or near syncope ? 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 ?Patient had no further questions. ? ?Rudean Haskell, MD ?Cardiologist ?[facility]  ?[place], [place] ?[place], [place] [address] ?[phone]  ?4:07 PM ? ?

## 2021-12-13 NOTE — Telephone Encounter (Signed)
Left a message to call back.

## 2021-12-14 DIAGNOSIS — R931 Abnormal findings on diagnostic imaging of heart and coronary circulation: Secondary | ICD-10-CM

## 2021-12-14 DIAGNOSIS — I2510 Atherosclerotic heart disease of native coronary artery without angina pectoris: Secondary | ICD-10-CM | POA: Diagnosis not present

## 2021-12-15 MED ORDER — NITROGLYCERIN 0.4 MG SL SUBL: 0.4000 mg | SUBLINGUAL_TABLET | SUBLINGUAL | 1 refills | Status: AC | PRN

## 2021-12-15 NOTE — Telephone Encounter (Signed)
Called pt informed of MD order for NTG.  Reviewed instructions and side effects for medication with pt. Also rescheduled pt for March 26, 2022 at 8 am.  Pt had no questions or concerns.  ?

## 2021-12-15 NOTE — Addendum Note (Signed)
Addended by: Precious Gilding on: 12/15/2021 01:41 PM ? ? Modules accepted: Orders ? ?

## 2021-12-24 NOTE — Progress Notes (Signed)
?Cardiology Office Note:   ? ?Date:  12/25/2021  ? ?ID:  Victoria Blair, DOB [birth year], MRN [id number] ? ?PCP:  Orpah Melter, MD ?  ?[facility] Providers ?Cardiologist:  Werner Lean, MD    ? ?Referring MD: Orpah Melter, MD  ? ?CC: f/u syncope and chest pain ? ?History of Present Illness:   ? ?Victoria Blair is a 76 y.o. female with a hx of HTN, tobacco abuse and Breast Cancer (radiation dose unclear) who presents after syncope. ?Seen 12/12/21: - Echo (normal); - CT Mild non obstructive CAD, oRCA ~ 50% unable to model FFR.  Added PRN nitro and discussed potential LHC. ? ?Patient notes that she is doing ok.  ?Fatigued but otherwise ok.  Getting a teeth cleaning today but no other planned procedures.  ?There are no interval hospital/ED visit.   ? ?No chest pain or pressure .  No SOB/DOE and no PND/Orthopnea.  No weight gain or leg swelling.  No palpitations or syncope.  No nitroglycerin needed.  No further near syncope. ? ?Down to 2 cigarettes a day. ? ?Past Medical History:  ?Diagnosis Date  ? Breast cancer ([place]) 1997  ? Depression   ? GERD (gastroesophageal reflux disease)   ? Graves disease   ? Hemorrhoids   ? Hypertension   ? Migraines   ? Osteoporosis   ? Personal history of radiation therapy 1998  ? Vitamin D deficiency   ? ? ?Past Surgical History:  ?Procedure Laterality Date  ? BREAST BIOPSY Right 08/11/1996  ? BREAST LUMPECTOMY Right 1998  ? CESAREAN SECTION    ? CHOLECYSTECTOMY    ? SPINE SURGERY    ? ? ?Current Medications: ?Current Meds  ?Medication Sig  ? aspirin EC 81 MG tablet Take 1 tablet (81 mg total) by mouth daily. Swallow whole.  ? Calcium Carbonate-Vitamin D (CALCIUM + D PO) Take 1 tablet by mouth daily.   ? ezetimibe (ZETIA) 10 MG tablet Take 1 tablet (10 mg total) by mouth daily.  ? fish oil-omega-3 fatty acids 1000 MG capsule Take 1 g by mouth daily.   ? Homeopathic Products (LEG CRAMPS PO) Take 2 tablets by mouth 2 (two) times daily as needed (leg/hand cramps).  ?  ibuprofen (ADVIL) 800 MG tablet Take 800 mg by mouth every 6 (six) hours as needed.  ? levothyroxine (SYNTHROID) 100 MCG tablet Take 1 tablet (100 mcg total) by mouth daily. (Patient taking differently: Take 100 mcg by mouth daily. Take every day except Thurs)  ? lisinopril (ZESTRIL) 5 MG tablet TAKE 1 TABLET BY MOUTH EVERY DAY  ? nitroGLYCERIN (NITROSTAT) 0.4 MG SL tablet Place 1 tablet (0.4 mg total) under the tongue every 5 (five) minutes as needed for chest pain (may take up to 3 tablets).  ? pravastatin (PRAVACHOL) 20 MG tablet Take 1 tablet (20 mg total) by mouth every evening.  ? Probiotic Product (PROBIOTIC DAILY) CAPS Take 1 capsule by mouth daily.  ? VITAMIN E PO Take 1 tablet by mouth daily. Unsure of dose  ? [DISCONTINUED] metoprolol tartrate (LOPRESSOR) 100 MG tablet Take 2 hours prior to Cardiac CT  ? [DISCONTINUED] nicotine polacrilex (COMMIT) 4 MG lozenge as needed for smoking cessation.  ?  ? ?Allergies:   Hydrocodone-acetaminophen, Erythromycin base, and Lipitor [atorvastatin]  ? ?Social History  ? ?Socioeconomic History  ? Marital status: Divorced  ?  Spouse name: Not on file  ? Number of children: Not on file  ? Years of education: Not on file  ?  Highest education level: Not on file  ?Occupational History  ? Not on file  ?Tobacco Use  ? Smoking status: Every Day  ?  Packs/day: 0.30  ?  Years: 40.00  ?  Pack years: 12.00  ?  Types: Cigarettes  ? Smokeless tobacco: Never  ? Tobacco comments:  ?  trying to quit. Pt states she is smoking about 5 cigarettes a day  ?Substance and Sexual Activity  ? Alcohol use: Yes  ?  Alcohol/week: 0.0 standard drinks  ?  Comment: seldom  ? Drug use: No  ? Sexual activity: Yes  ?Other Topics Concern  ? Not on file  ?Social History Narrative  ? ** Merged History Encounter **  ?    ? ?Social Determinants of Health  ? ?Financial Resource Strain: Not on file  ?Food Insecurity: Not on file  ?Transportation Needs: Not on file  ?Physical Activity: Not on file  ?Stress: Not  on file  ?Social Connections: Not on file  ?  ?Social: partner had esophageal cancer (deceased) ? ?Family History: ?The patient's family history includes Diabetes in her maternal aunt; Heart disease in her father; Hypertension in her father; Pancreatic cancer in her sister; Pulmonary fibrosis in her father; Sleep apnea in her son. ? ?ROS:   ?Please see the history of present illness.    ? All other systems reviewed and are negative. ? ?EKGs/Labs/Other Studies Reviewed:   ? ?Recent Labs: ?11/11/2021: ALT 15; Hemoglobin 12.1; Platelets 259 ?11/20/2021: BUN 22; Creatinine, Ser 0.94; Potassium 3.8; Sodium 142; TSH 0.06  ? ?EKG ?12/25/21: SR rate 65  ? ?Recent Lipid Panel ?   ?Component Value Date/Time  ? CHOL 188 12/28/2019 0820  ? TRIG 90.0 12/28/2019 0820  ? HDL 46.60 12/28/2019 0820  ? CHOLHDL 4 12/28/2019 0820  ? VLDL 18.0 12/28/2019 0820  ? [place] 124 (H) 12/28/2019 0820  ?    ? ?Physical Exam:   ? ?VS:  BP 120/68   Pulse 65   Ht '5\' 7"'$  (1.702 m)   Wt 111 lb 12.8 oz (50.7 kg)   SpO2 99%   BMI 17.51 kg/m?    ? ?Wt Readings from Last 3 Encounters:  ?12/25/21 111 lb 12.8 oz (50.7 kg)  ?11/23/21 115 lb (52.2 kg)  ?11/22/21 114 lb 3.2 oz (51.8 kg)  ?  ?Gen: no distress  ?Neck: No JVD ?Cardiac: No Rubs or Gallops, no Murmur, RRR +2 radial pulses ?Respiratory: Clear to auscultation bilaterally, normal effort, normal  respiratory rate ?GI: Soft, nontender, non-distended  ?MS: No  edema;  moves all extremities ?Integument: Skin feels warm ?Neuro:  At time of evaluation, alert and oriented to person/place/time/situation  ?Psych: Normal affect, patient feels warm ? ? ?ASSESSMENT:   ? ?1. Coronary artery disease involving native coronary artery of native heart without angina pectoris   ?2. BREAST CANCER, HX OF   ?3. Aortic atherosclerosis ([place])   ?4. Tobacco abuse   ?5. Pure hypercholesterolemia   ? ? ?PLAN:   ? ?Moderate non obstructive CAD ?Breast Cancer with prior radiation ?Tobacco abuse- discussed cessation (has worked  to cut down) ?HLD with prior statin myopathy ?- Reviewed CT with patient- we are unable to model her RCA; I do not think this is the cause of her syncope, but it is important to review her prox RCA has a functionally limiting stenosis ?- ASA 81 mg  ?- Zetia 10 mg;  Lipitor myalgias ?- will trial pravastatin 20 mg labs in three months; if issues will  stop ?- BP controled on lisinopril 5 mg ?- has need no PRN nitro ?- we have discussed limited evidence fo Omega 3 fatty acids ?- will order NM Stress test  ? ?Six months unless new issues ? ?   ? ? ?Medication Adjustments/Labs and Tests Ordered: ?Current medicines are reviewed at length with the patient today.  Concerns regarding medicines are outlined above.  ?Orders Placed This Encounter  ?Procedures  ? Lipid panel  ? ALT  ? Cardiac Stress Test: Informed Consent Details: Physician/Practitioner Attestation; Transcribe to consent form and obtain patient signature  ? MYOCARDIAL PERFUSION IMAGING  ? EKG 12-Lead  ? ?Meds ordered this encounter  ?Medications  ? pravastatin (PRAVACHOL) 20 MG tablet  ?  Sig: Take 1 tablet (20 mg total) by mouth every evening.  ?  Dispense:  90 tablet  ?  Refill:  3  ? ? ?Patient Instructions  ?Medication Instructions:  ?Your physician has recommended you make the following change in your medication:  ?START: Pravastatin 20 mg by mouth once daily take around dinner time ?*If you need a refill on your cardiac medications before your next appointment, please call your pharmacy* ? ? ?Lab Work: ?IN 3 MONTHS: FLP, ALT ?If you have labs (blood work) drawn today and your tests are completely normal, you will receive your results only by: ?MyChart Message (if you have MyChart) OR ?A paper copy in the mail ?If you have any lab test that is abnormal or we need to change your treatment, we will call you to review the results. ? ? ?Testing/Procedures: ?Your physician has requested that you have en exercise stress myoview. For further information please  visit [email or web address]. Please follow instruction sheet, as given.  ? ?You are scheduled for a Myocardial Perfusion Imaging Study. ?Please arrive 15 minutes prior to your appointment time for registration and in

## 2021-12-25 ENCOUNTER — Encounter: Payer: Self-pay | Admitting: Internal Medicine

## 2021-12-25 ENCOUNTER — Ambulatory Visit (INDEPENDENT_AMBULATORY_CARE_PROVIDER_SITE_OTHER): Payer: Medicare HMO | Admitting: Internal Medicine

## 2021-12-25 VITALS — BP 120/68 | HR 65 | Ht 67.0 in | Wt 111.8 lb

## 2021-12-25 DIAGNOSIS — E7800 Pure hypercholesterolemia, unspecified: Secondary | ICD-10-CM

## 2021-12-25 DIAGNOSIS — Z853 Personal history of malignant neoplasm of breast: Secondary | ICD-10-CM

## 2021-12-25 DIAGNOSIS — I700 Atherosclerosis of aorta: Secondary | ICD-10-CM | POA: Diagnosis not present

## 2021-12-25 DIAGNOSIS — Z720 Tobacco use: Secondary | ICD-10-CM | POA: Diagnosis not present

## 2021-12-25 DIAGNOSIS — I2510 Atherosclerotic heart disease of native coronary artery without angina pectoris: Secondary | ICD-10-CM | POA: Diagnosis not present

## 2021-12-25 MED ORDER — PRAVASTATIN SODIUM 20 MG PO TABS: 20.0000 mg | ORAL_TABLET | Freq: Every evening | ORAL | 3 refills | Status: DC

## 2021-12-25 NOTE — Patient Instructions (Signed)
Medication Instructions:  ?Your physician has recommended you make the following change in your medication:  ?START: Pravastatin 20 mg by mouth once daily take around dinner time ?*If you need a refill on your cardiac medications before your next appointment, please call your pharmacy* ? ? ?Lab Work: ?IN 3 MONTHS: FLP, ALT ?If you have labs (blood work) drawn today and your tests are completely normal, you will receive your results only by: ?MyChart Message (if you have MyChart) OR ?A paper copy in the mail ?If you have any lab test that is abnormal or we need to change your treatment, we will call you to review the results. ? ? ?Testing/Procedures: ?Your physician has requested that you have en exercise stress myoview. For further information please visit [email or web address]. Please follow instruction sheet, as given.  ? ?You are scheduled for a Myocardial Perfusion Imaging Study. ?Please arrive 15 minutes prior to your appointment time for registration and insurance purposes. ?  ?The test will take approximately 3 to 4 hours to complete; you may bring reading material.  If someone comes with you to your appointment, they will need to remain in the main lobby due to limited space in the testing area.  ?  ?How to prepare for your Myocardial Perfusion Test: ?Do not eat or drink 3 hours prior to your test, except you may have water. ?Do not consume products containing caffeine (regular or decaffeinated) 12 hours prior to your test. (ex: coffee, chocolate, sodas, tea). ?Do bring a list of your current medications with you.  If not listed below, you may take your medications as normal. ?Do wear comfortable clothes (no dresses or overalls) and walking shoes, tennis shoes preferred (No heels or open toe shoes are allowed). ?Do NOT wear cologne, perfume, aftershave, or lotions (deodorant is allowed). ?If these instructions are not followed, your test will have to be rescheduled. ? ?If you cannot keep your appointment,  please provide 24 hours notification to the Nuclear Lab, to avoid a possible $50 charge to your account.  ?   ? ? ?Follow-Up: ?At [facility], you and your health needs are our priority.  As part of our continuing mission to provide you with exceptional heart care, we have created designated Provider Care Teams.  These Care Teams include your primary Cardiologist (physician) and Advanced Practice Providers (APPs -  Physician Assistants and Nurse Practitioners) who all work together to provide you with the care you need, when you need it. ? ? ?Your next appointment:   ?6 month(s) ? ?The format for your next appointment:   ?In Person ? ?Provider:   ?Werner Lean, MD   ? ? ?Important Information About Sugar ? ? ? ? ?  ?

## 2021-12-27 ENCOUNTER — Encounter (HOSPITAL_COMMUNITY): Payer: Self-pay | Admitting: *Deleted

## 2021-12-27 ENCOUNTER — Telehealth (HOSPITAL_COMMUNITY): Payer: Self-pay | Admitting: *Deleted

## 2021-12-27 NOTE — Telephone Encounter (Signed)
Left message on voicemail in reference to upcoming appointment scheduled for 01/03/22. Phone number given for a call back so details instructions can be given. Victoria Blair W ?Mychart letter sent ? ?

## 2021-12-28 ENCOUNTER — Telehealth (HOSPITAL_COMMUNITY): Payer: Self-pay

## 2021-12-28 NOTE — Telephone Encounter (Signed)
Patient given detailed instructions per Myocardial Perfusion Study Information Sheet for the test on 5/10 at 1000. Patient notified to arrive 15 minutes early and that it is imperative to arrive on time for appointment to keep from having the test rescheduled. ? If you need to cancel or reschedule your appointment, please call the office within 24 hours of your appointment. . Patient verbalized understanding. tmy ? ?

## 2022-01-03 ENCOUNTER — Ambulatory Visit (HOSPITAL_COMMUNITY): Payer: Medicare HMO | Attending: Cardiology

## 2022-01-03 ENCOUNTER — Other Ambulatory Visit: Payer: Medicare HMO

## 2022-01-03 DIAGNOSIS — I2510 Atherosclerotic heart disease of native coronary artery without angina pectoris: Secondary | ICD-10-CM

## 2022-01-03 LAB — MYOCARDIAL PERFUSION IMAGING
Base ST Depression (mm): 0 mm
Estimated workload: 5.8
Exercise duration (min): 4 min
Exercise duration (sec): 0 s
LV dias vol: 58 mL (ref 46–106)
LV sys vol: 19 mL
MPHR: 147 {beats}/min
Nuc Stress EF: 67 %
Peak HR: 136 {beats}/min
Percent HR: 92 %
Rest HR: 77 {beats}/min
Rest Nuclear Isotope Dose: 10.8 mCi
SDS: 0
SRS: 0
SSS: 0
ST Depression (mm): 0 mm
Stress Nuclear Isotope Dose: 31.7 mCi
TID: 1

## 2022-01-03 MED ORDER — TECHNETIUM TC 99M TETROFOSMIN IV KIT
31.7000 | PACK | Freq: Once | INTRAVENOUS | Status: AC | PRN
  Administered 2022-01-03: 31.7 via INTRAVENOUS
  Filled 2022-01-03: qty 32

## 2022-01-03 MED ORDER — TECHNETIUM TC 99M TETROFOSMIN IV KIT
10.8000 | PACK | Freq: Once | INTRAVENOUS | Status: AC | PRN
  Administered 2022-01-03: 10.8 via INTRAVENOUS
  Filled 2022-01-03: qty 11

## 2022-01-04 ENCOUNTER — Telehealth: Payer: Self-pay | Admitting: Internal Medicine

## 2022-01-04 ENCOUNTER — Other Ambulatory Visit: Payer: Medicare HMO

## 2022-01-04 ENCOUNTER — Other Ambulatory Visit (INDEPENDENT_AMBULATORY_CARE_PROVIDER_SITE_OTHER): Payer: Medicare HMO

## 2022-01-04 DIAGNOSIS — E890 Postprocedural hypothyroidism: Secondary | ICD-10-CM

## 2022-01-04 LAB — T4, FREE: Free T4: 1.3 ng/dL (ref 0.60–1.60)

## 2022-01-04 LAB — TSH: TSH: 0.21 u[IU]/mL — ABNORMAL LOW (ref 0.35–5.50)

## 2022-01-04 NOTE — Telephone Encounter (Signed)
Pt aware of the need to continue to taking medications.Pt verbalized understanding ./cy  ?

## 2022-01-04 NOTE — Telephone Encounter (Signed)
?  Pt c/o medication issue: ? ?1. Name of Medication:  ? ?ezetimibe (ZETIA) 10 MG tablet ?pravastatin (PRAVACHOL) 20 MG tablet ? ?2. How are you currently taking this medication (dosage and times per day)?  As directed ? ? ?3. Are you having a reaction (difficulty breathing--STAT)?  No ? ?4. What is your medication issue? Patient wasn't sure if she was put on this medication temporarily until her stress test or if she needs to continue to take it. Please advise.   ?

## 2022-01-10 ENCOUNTER — Ambulatory Visit (INDEPENDENT_AMBULATORY_CARE_PROVIDER_SITE_OTHER): Payer: Medicare HMO | Admitting: Endocrinology

## 2022-01-10 ENCOUNTER — Encounter: Payer: Self-pay | Admitting: Endocrinology

## 2022-01-10 VITALS — BP 98/64 | HR 72 | Ht 67.0 in | Wt 110.0 lb

## 2022-01-10 DIAGNOSIS — E890 Postprocedural hypothyroidism: Secondary | ICD-10-CM

## 2022-01-10 DIAGNOSIS — I952 Hypotension due to drugs: Secondary | ICD-10-CM

## 2022-01-10 MED ORDER — LEVOTHYROXINE SODIUM 75 MCG PO TABS: 75.0000 ug | ORAL_TABLET | Freq: Every day | ORAL | 3 refills | Status: DC

## 2022-01-10 NOTE — Patient Instructions (Signed)
Stop Lisinopril.

## 2022-01-10 NOTE — Progress Notes (Signed)
? ?       ? ? ? ?Patient ID: Victoria Blair, female   DOB: May 17, 1946, 76 y.o.   MRN: [id number] ? ? ? ?Reason for Appointment:  Follow-up visit for various problems ? ? History of Present Illness:  ? ? ?OSTEOPOROSIS; previously was given Actonel but stopped this because of difficulty with compliance.  ?T score at spine was -2.8 in October 2018 ? ?T score at the spine in 06/2019 was -3.1 and declined 4.6% ? ?Results from 06/2021: ?  Lumbar spine L1-L4 Femoral neck (FN) 33% distal radius  ?T-score -2.7 RFN: -1.9 ?LFN: -2.6 n/a  ?Change in BMD from previous DXA test (%) Up 5.9% Down 5.3% n/a  ? ? ?In her youth she was 5 feet 7-1/2 inches tall ? ?She previously had been taking Evista since 5/09.  This was restarted on her visit in 05/2018 ?Subsequently stopped this because of cost ? ?She has been treated with RECLAST: She had the first infusion in 07/2019 without any side effect ?She had a second infusion of 5 g Reclast on 08/31/2020 ?Most recent infusion was on 09/12/2021 ? ?Vitamin D deficiency: ?She is bone on calcium  with  vitamin D  ?She  recommended 2000 Units for her vitamin D deficiency ? ?Lab Results  ?Component Value Date  ? VD25OH 30.65 09/04/2021  ? VD25OH 48.76 03/27/2021  ? VD25OH 30.62 07/25/2020  ? VD25OH 20.40 (L) 12/28/2019  ? ? ? ?HYPOTHYROIDISM: This was first diagnosed in 1978 after I-131 treatment for Graves' disease   ?       ?Her dosages have been adjusted periodically ? ?Her dose was adjusted last in 8/22 when her TSH was relatively high ?She is now taking 100ug, 6 days a week ? ?Although previously had required a higher dose of levothyroxine she appears to be needing less supplement now ?Was asymptomatic when her TSH was suppressed in 3/23 but did not feel any different with reducing her dose ? ?Not complaining of any unusual tiredness or heat or cold intolerance ?No palpitations ? ?She is taking levothyroxine daily in the morning before breakfast regularly ?Not taking any vitamins in the  morning ? ?TSH history: ? ?Lab Results  ?Component Value Date  ? TSH 0.21 (L) 01/04/2022  ? TSH 0.06 (L) 11/20/2021  ? TSH 2.17 09/04/2021  ? FREET4 1.30 01/04/2022  ? FREET4 1.09 09/04/2021  ? FREET4 1.21 05/22/2021  ? ?' ? ?HYPERTENSION: She has been on treatment since 2009 and had previously been on Lotrel for quite some time with good control.  ? Blood pressure was low normal in 11/2017 and her Lotrel was stopped ? ?Now taking lisinopril 5 mg daily  ?No lightheadedness ?Does not check at home ?   ?BP Readings from Last 3 Encounters:  ?01/10/22 98/64  ?12/25/21 120/68  ?12/12/21 (!) 131/58  ? ? ? ?Allergies as of 01/10/2022   ? ?   Reactions  ? Hydrocodone-acetaminophen   ? Patient passed out with this medication  ? Erythromycin Base Other (See Comments)  ? Other  ? Lipitor [atorvastatin]   ? Myalgias  ? ?  ? ?  ?Medication List  ?  ? ?  ? Accurate as of Jan 10, 2022  2:31 PM. If you have any questions, ask your nurse or doctor.  ?  ?  ? ?  ? ?aspirin EC 81 MG tablet ?Take 1 tablet (81 mg total) by mouth daily. Swallow whole. ?  ?CALCIUM + D PO ?Take 1  tablet by mouth daily. ?  ?ezetimibe 10 MG tablet ?Commonly known as: ZETIA ?Take 1 tablet (10 mg total) by mouth daily. ?  ?fish oil-omega-3 fatty acids 1000 MG capsule ?Take 1 g by mouth daily. ?  ?ibuprofen 800 MG tablet ?Commonly known as: ADVIL ?Take 800 mg by mouth every 6 (six) hours as needed. ?  ?LEG CRAMPS PO ?Take 2 tablets by mouth 2 (two) times daily as needed (leg/hand cramps). ?  ?levothyroxine 75 MCG tablet ?Commonly known as: SYNTHROID ?Take 1 tablet (75 mcg total) by mouth daily. ?What changed:  ?medication strength ?how much to take ?Changed by: Elayne Snare, MD ?  ?lisinopril 5 MG tablet ?Commonly known as: ZESTRIL ?TAKE 1 TABLET BY MOUTH EVERY DAY ?  ?nitroGLYCERIN 0.4 MG SL tablet ?Commonly known as: NITROSTAT ?Place 1 tablet (0.4 mg total) under the tongue every 5 (five) minutes as needed for chest pain (may take up to 3 tablets). ?  ?pravastatin  20 MG tablet ?Commonly known as: PRAVACHOL ?Take 1 tablet (20 mg total) by mouth every evening. ?  ?Probiotic Daily Caps ?Take 1 capsule by mouth daily. ?  ?VITAMIN E PO ?Take 1 tablet by mouth daily. Unsure of dose ?  ? ?  ? ? ?Past Medical History:  ?Diagnosis Date  ? Breast cancer ([place]) 1997  ? Depression   ? GERD (gastroesophageal reflux disease)   ? Graves disease   ? Hemorrhoids   ? Hypertension   ? Migraines   ? Osteoporosis   ? Personal history of radiation therapy 1998  ? Vitamin D deficiency   ? ? ?Past Surgical History:  ?Procedure Laterality Date  ? BREAST BIOPSY Right 08/11/1996  ? BREAST LUMPECTOMY Right 1998  ? CESAREAN SECTION    ? CHOLECYSTECTOMY    ? SPINE SURGERY    ? ? ?Right breast cancer treated with lumpectomy and radiation 2000, lumbar spine surgery   ? ? ?Family History  ?Problem Relation Age of Onset  ? Heart disease Father   ? Pulmonary fibrosis Father   ? Hypertension Father   ? Pancreatic cancer Sister   ? Sleep apnea Son   ? Diabetes Maternal Aunt   ? ? ?Social History:  reports that she has been smoking cigarettes. She has a 12.00 pack-year smoking history. She has never used smokeless tobacco. She reports current alcohol use. She reports that she does not use drugs. ? ?Allergies:  ?Allergies  ?Allergen Reactions  ? Hydrocodone-Acetaminophen   ?  Patient passed out with this medication  ? Erythromycin Base Other (See Comments)  ?  Other  ? Lipitor [Atorvastatin]   ?  Myalgias  ? ? ?REVIEW of systems: ? ?Weight history: ?  ?Wt Readings from Last 3 Encounters:  ?01/10/22 110 lb (49.9 kg)  ?01/03/22 111 lb (50.3 kg)  ?12/25/21 111 lb 12.8 oz (50.7 kg)  ? ? ? ?Hyperlipidemia: She has a persistently mild increase in LDL, unchanged ?Does not have any other risk factors ?LDL last 116 ?  ?Lab Results  ?Component Value Date  ? CHOL 188 12/28/2019  ? CHOL 187 06/24/2019  ? CHOL 202 (H) 12/24/2018  ? ?Lab Results  ?Component Value Date  ? HDL 46.60 12/28/2019  ? HDL 48.20 06/24/2019  ? HDL 50.70  12/24/2018  ? ?Lab Results  ?Component Value Date  ? LDLCALC 124 (H) 12/28/2019  ? [place] 122 (H) 06/24/2019  ? LDLCALC 128 (H) 12/24/2018  ? ?Lab Results  ?Component Value Date  ? TRIG 90.0  12/28/2019  ? TRIG 84.0 06/24/2019  ? TRIG 116.0 12/24/2018  ? ?Lab Results  ?Component Value Date  ? CHOLHDL 4 12/28/2019  ? CHOLHDL 4 06/24/2019  ? CHOLHDL 4 12/24/2018  ? ?No results found for: LDLDIRECT ? ?     ? Examination: ?  ?BP 98/64   Pulse 72   Ht '5\' 7"'$  (1.702 m)   Wt 110 lb (49.9 kg)   SpO2 99%   BMI 17.23 kg/m?  ? ? ? ?  Assessments / PLAN: ? ? ?Hypertension: Her blood pressure is below normal today and may be related to her weight loss ?She will stop her lisinopril completely and keep a follow-up on the blood pressure ? ? ?Osteoporosis:  ? ?Treated with Reclast annually with improvement in spine bone density although slightly worse at the hip ?Next treatment will be due in 08/2022 ? ? ?HYPOTHYROIDISM, post ablative, long-standing ? ?Currently on the equivalent of about 85 mcg levothyroxine daily ?Although she has lost only 3 pounds her TSH is still suppressed, previously on 100 mcg ?Asymptomatic at present ?She will now reduce her dose to 75 mcg daily and follow-up in 3 months ? ? ? ?Elayne Snare ?01/10/2022, 2:31 PM  ? ?  ? ? ? ? ?  ? ? ?  ?

## 2022-01-11 DIAGNOSIS — M25571 Pain in right ankle and joints of right foot: Secondary | ICD-10-CM | POA: Diagnosis not present

## 2022-01-24 DIAGNOSIS — H524 Presbyopia: Secondary | ICD-10-CM | POA: Diagnosis not present

## 2022-01-24 DIAGNOSIS — H35372 Puckering of macula, left eye: Secondary | ICD-10-CM | POA: Diagnosis not present

## 2022-01-24 DIAGNOSIS — H2511 Age-related nuclear cataract, right eye: Secondary | ICD-10-CM | POA: Diagnosis not present

## 2022-01-24 DIAGNOSIS — H40031 Anatomical narrow angle, right eye: Secondary | ICD-10-CM | POA: Diagnosis not present

## 2022-02-08 DIAGNOSIS — H2511 Age-related nuclear cataract, right eye: Secondary | ICD-10-CM | POA: Diagnosis not present

## 2022-02-08 DIAGNOSIS — H269 Unspecified cataract: Secondary | ICD-10-CM | POA: Diagnosis not present

## 2022-02-12 ENCOUNTER — Telehealth: Payer: Self-pay | Admitting: Internal Medicine

## 2022-02-12 NOTE — Telephone Encounter (Signed)
*  STAT* If patient is at the pharmacy, call can be transferred to refill team.   1. Which medications need to be refilled? (please list name of each medication and dose if known) ezetimibe (ZETIA) 10 MG tablet  2. Which pharmacy/location (including street and city if local pharmacy) is medication to be sent to?[id number] [address]. Do they need a 30 day or 90 day supply? 90 day  Patient is out of the medication.

## 2022-02-20 DIAGNOSIS — I10 Essential (primary) hypertension: Secondary | ICD-10-CM | POA: Diagnosis not present

## 2022-02-20 DIAGNOSIS — Z23 Encounter for immunization: Secondary | ICD-10-CM | POA: Diagnosis not present

## 2022-02-20 DIAGNOSIS — R69 Illness, unspecified: Secondary | ICD-10-CM | POA: Diagnosis not present

## 2022-02-20 DIAGNOSIS — Z8742 Personal history of other diseases of the female genital tract: Secondary | ICD-10-CM | POA: Diagnosis not present

## 2022-02-20 DIAGNOSIS — Z86010 Personal history of colonic polyps: Secondary | ICD-10-CM | POA: Diagnosis not present

## 2022-02-20 DIAGNOSIS — E039 Hypothyroidism, unspecified: Secondary | ICD-10-CM | POA: Diagnosis not present

## 2022-02-20 DIAGNOSIS — M810 Age-related osteoporosis without current pathological fracture: Secondary | ICD-10-CM | POA: Diagnosis not present

## 2022-03-15 DIAGNOSIS — R888 Abnormal findings in other body fluids and substances: Secondary | ICD-10-CM | POA: Diagnosis not present

## 2022-03-15 DIAGNOSIS — R69 Illness, unspecified: Secondary | ICD-10-CM | POA: Diagnosis not present

## 2022-03-15 DIAGNOSIS — Z8742 Personal history of other diseases of the female genital tract: Secondary | ICD-10-CM | POA: Diagnosis not present

## 2022-03-15 DIAGNOSIS — Z124 Encounter for screening for malignant neoplasm of cervix: Secondary | ICD-10-CM | POA: Diagnosis not present

## 2022-03-15 DIAGNOSIS — Z9189 Other specified personal risk factors, not elsewhere classified: Secondary | ICD-10-CM | POA: Diagnosis not present

## 2022-03-16 ENCOUNTER — Other Ambulatory Visit: Payer: Self-pay | Admitting: Family Medicine

## 2022-03-16 DIAGNOSIS — Z1231 Encounter for screening mammogram for malignant neoplasm of breast: Secondary | ICD-10-CM | POA: Diagnosis not present

## 2022-03-26 ENCOUNTER — Ambulatory Visit: Payer: Medicare HMO | Admitting: Internal Medicine

## 2022-03-27 ENCOUNTER — Other Ambulatory Visit: Payer: Medicare HMO

## 2022-03-27 DIAGNOSIS — I2510 Atherosclerotic heart disease of native coronary artery without angina pectoris: Secondary | ICD-10-CM

## 2022-03-27 LAB — LIPID PANEL
Chol/HDL Ratio: 2.8 ratio (ref 0.0–4.4)
Cholesterol, Total: 108 mg/dL (ref 100–199)
HDL: 39 mg/dL — ABNORMAL LOW (ref >39)
LDL Chol Calc (NIH): 55 mg/dL (ref 0–99)
Triglycerides: 66 mg/dL (ref 0–149)
VLDL Cholesterol Cal: 14 mg/dL (ref 5–40)

## 2022-03-27 LAB — ALT: ALT: 11 IU/L (ref 0–32)

## 2022-04-02 DIAGNOSIS — R87619 Unspecified abnormal cytological findings in specimens from cervix uteri: Secondary | ICD-10-CM | POA: Diagnosis not present

## 2022-04-02 DIAGNOSIS — R69 Illness, unspecified: Secondary | ICD-10-CM | POA: Diagnosis not present

## 2022-04-02 DIAGNOSIS — B977 Papillomavirus as the cause of diseases classified elsewhere: Secondary | ICD-10-CM | POA: Diagnosis not present

## 2022-04-10 ENCOUNTER — Other Ambulatory Visit (INDEPENDENT_AMBULATORY_CARE_PROVIDER_SITE_OTHER): Payer: Medicare HMO

## 2022-04-10 DIAGNOSIS — E890 Postprocedural hypothyroidism: Secondary | ICD-10-CM | POA: Diagnosis not present

## 2022-04-10 DIAGNOSIS — I952 Hypotension due to drugs: Secondary | ICD-10-CM

## 2022-04-10 LAB — BASIC METABOLIC PANEL
BUN: 19 mg/dL (ref 6–23)
CO2: 30 mEq/L (ref 19–32)
Calcium: 9.6 mg/dL (ref 8.4–10.5)
Chloride: 104 mEq/L (ref 96–112)
Creatinine, Ser: 1.02 mg/dL (ref 0.40–1.20)
GFR: 54.34 mL/min — ABNORMAL LOW (ref >60.00)
Glucose, Bld: 101 mg/dL — ABNORMAL HIGH (ref 70–99)
Potassium: 4.1 mEq/L (ref 3.5–5.1)
Sodium: 140 mEq/L (ref 135–145)

## 2022-04-10 LAB — T4, FREE: Free T4: 1.35 ng/dL (ref 0.60–1.60)

## 2022-04-10 LAB — TSH: TSH: 1.09 u[IU]/mL (ref 0.35–5.50)

## 2022-04-12 ENCOUNTER — Encounter: Payer: Self-pay | Admitting: Endocrinology

## 2022-04-12 ENCOUNTER — Ambulatory Visit (INDEPENDENT_AMBULATORY_CARE_PROVIDER_SITE_OTHER): Payer: Medicare HMO | Admitting: Endocrinology

## 2022-04-12 VITALS — BP 130/72 | HR 74 | Ht 66.0 in | Wt 111.6 lb

## 2022-04-12 DIAGNOSIS — M810 Age-related osteoporosis without current pathological fracture: Secondary | ICD-10-CM

## 2022-04-12 DIAGNOSIS — I10 Essential (primary) hypertension: Secondary | ICD-10-CM | POA: Diagnosis not present

## 2022-04-12 DIAGNOSIS — E559 Vitamin D deficiency, unspecified: Secondary | ICD-10-CM

## 2022-04-12 DIAGNOSIS — E890 Postprocedural hypothyroidism: Secondary | ICD-10-CM

## 2022-04-12 NOTE — Progress Notes (Signed)
Patient ID: Victoria Blair, female   DOB: [birth year], 76 y.o.   MRN: [id number]    Reason for Appointment:  Follow-up visit for various problems   History of Present Illness:    OSTEOPOROSIS; previously was given Actonel but stopped this because of difficulty with compliance.  T score at spine was -2.8 in October 2018  T score at the spine in 06/2019 was -3.1 and declined 4.6%  Results from 06/2021:   Lumbar spine L1-L4 Femoral neck (FN) 33% distal radius  T-score -2.7 RFN: -1.9 LFN: -2.6 n/a  Change in BMD from previous DXA test (%) Up 5.9% Down 5.3% n/a    In her youth she was 5 feet 7-1/2 inches tall  She previously had been taking Evista since 5/09.  This was restarted on her visit in 05/2018 Subsequently stopped this because of cost  She has been treated with RECLAST: She had the first infusion in 07/2019 without any side effect She had a second infusion of 5 g Reclast on 08/31/2020 Most recent infusion was on 09/12/2021  Vitamin D deficiency:  She has been recommended 2000 Units for her vitamin D deficiency  Lab Results  Component Value Date   VD25OH 30.65 09/04/2021   VD25OH 48.76 03/27/2021   VD25OH 30.62 07/25/2020   VD25OH 20.40 (L) 12/28/2019     HYPOTHYROIDISM: This was first diagnosed in 1978 after I-131 treatment for Graves' disease          Her dosages have been adjusted periodically  Her dose was adjusted last in 8/22 when her TSH was relatively high She is now taking 75 micrograms daily  Her dose has been reduced in 2003 Did not feel any different with changing her doses Overall feels good Her weight is stable  She is taking levothyroxine daily in the morning before breakfast regularly Not taking any vitamins with this  TSH history:  Lab Results  Component Value Date   TSH 1.09 04/10/2022   TSH 0.21 (L) 01/04/2022   TSH 0.06 (L) 11/20/2021   FREET4 1.35 04/10/2022   FREET4 1.30 01/04/2022   FREET4 1.09 09/04/2021    ' Wt Readings from Last 3 Encounters:  04/12/22 111 lb 9.6 oz (50.6 kg)  01/10/22 110 lb (49.9 kg)  01/03/22 111 lb (50.3 kg)     HYPERTENSION: She has been on treatment since 2009 and had previously been on Lotrel for quite some time with good control.   Blood pressure was low normal in 11/2017 and her Lotrel was stopped  Still taking lisinopril 5 mg daily, was previously recommended stopping it when her blood pressure was low Also periodically seen by cardiologist Does not check at home    BP Readings from Last 3 Encounters:  04/12/22 130/72  01/10/22 98/64  12/25/21 120/68     Allergies as of 04/12/2022       Reactions   Hydrocodone-acetaminophen    Patient passed out with this medication   Erythromycin Base Other (See Comments)   Other   Lipitor [atorvastatin]    Myalgias        Medication List        Accurate as of April 12, 2022  8:14 PM. If you have any questions, ask your nurse or doctor.          aspirin EC 81 MG tablet Take 1 tablet (81 mg total) by mouth daily. Swallow whole.   CALCIUM + D PO  Take 1 tablet by mouth daily.   ezetimibe 10 MG tablet Commonly known as: ZETIA Take 1 tablet (10 mg total) by mouth daily.   fish oil-omega-3 fatty acids 1000 MG capsule Take 1 g by mouth daily.   ibuprofen 800 MG tablet Commonly known as: ADVIL Take 800 mg by mouth every 6 (six) hours as needed.   LEG CRAMPS PO Take 2 tablets by mouth 2 (two) times daily as needed (leg/hand cramps).   levothyroxine 75 MCG tablet Commonly known as: SYNTHROID Take 1 tablet (75 mcg total) by mouth daily.   lisinopril 5 MG tablet Commonly known as: ZESTRIL TAKE 1 TABLET BY MOUTH EVERY DAY   nitroGLYCERIN 0.4 MG SL tablet Commonly known as: NITROSTAT Place 1 tablet (0.4 mg total) under the tongue every 5 (five) minutes as needed for chest pain (may take up to 3 tablets).   pravastatin 20 MG tablet Commonly known as: PRAVACHOL Take 1 tablet (20 mg total) by  mouth every evening.   Probiotic Daily Caps Take 1 capsule by mouth daily.   VITAMIN E PO Take 1 tablet by mouth daily. Unsure of dose        Past Medical History:  Diagnosis Date   Breast cancer ([place]) 1997   Depression    GERD (gastroesophageal reflux disease)    Graves disease    Hemorrhoids    Hypertension    Migraines    Osteoporosis    Personal history of radiation therapy 1998   Vitamin D deficiency     Past Surgical History:  Procedure Laterality Date   BREAST BIOPSY Right 08/11/1996   BREAST LUMPECTOMY Right 1998   CESAREAN SECTION     CHOLECYSTECTOMY     SPINE SURGERY      Right breast cancer treated with lumpectomy and radiation 2000, lumbar spine surgery     Family History  Problem Relation Age of Onset   Heart disease Father    Pulmonary fibrosis Father    Hypertension Father    Pancreatic cancer Sister    Sleep apnea Son    Diabetes Maternal Aunt     Social History:  reports that she has been smoking cigarettes. She has a 12.00 pack-year smoking history. She has never used smokeless tobacco. She reports current alcohol use. She reports that she does not use drugs.  Allergies:  Allergies  Allergen Reactions   Hydrocodone-Acetaminophen     Patient passed out with this medication   Erythromycin Base Other (See Comments)    Other   Lipitor [Atorvastatin]     Myalgias    REVIEW of systems:  Weight history:   Wt Readings from Last 3 Encounters:  04/12/22 111 lb 9.6 oz (50.6 kg)  01/10/22 110 lb (49.9 kg)  01/03/22 111 lb (50.3 kg)     Hyperlipidemia: She has a persistently mild increase in LDL, unchanged Now managed by cardiology with pravastatin and Zetia   Lab Results  Component Value Date   CHOL 108 03/27/2022   CHOL 188 12/28/2019   CHOL 187 06/24/2019   Lab Results  Component Value Date   HDL 39 (L) 03/27/2022   HDL 46.60 12/28/2019   HDL 48.20 06/24/2019   Lab Results  Component Value Date   LDLCALC 55 03/27/2022    LDLCALC 124 (H) 12/28/2019   LDLCALC 122 (H) 06/24/2019   Lab Results  Component Value Date   TRIG 66 03/27/2022   TRIG 90.0 12/28/2019   TRIG 84.0 06/24/2019   Lab Results  Component Value Date   CHOLHDL 2.8 03/27/2022   CHOLHDL 4 12/28/2019   CHOLHDL 4 06/24/2019   No results found for: "LDLDIRECT"        Examination:   BP 130/72   Pulse 74   Ht '5\' 6"'$  (1.676 m)   Wt 111 lb 9.6 oz (50.6 kg)   SpO2 94%   BMI 18.01 kg/m       Assessments / PLAN:   Hypertension: Her blood pressure is fairly good even though she is only taking 5 mg lisinopril  Osteoporosis:   Treated with Reclast annually with improvement in spine bone density as above Next treatment will be due in 08/2022  HYPOTHYROIDISM, post ablative, long-standing  Currently on 75 mcg with reduced dosage this year TSH is back to normal Also subjectively doing well Continue the same dose   Elayne Snare 04/12/2022, 8:14 PM

## 2022-04-29 DIAGNOSIS — M25512 Pain in left shoulder: Secondary | ICD-10-CM | POA: Diagnosis not present

## 2022-04-29 DIAGNOSIS — W19XXXA Unspecified fall, initial encounter: Secondary | ICD-10-CM | POA: Diagnosis not present

## 2022-04-29 DIAGNOSIS — S0993XA Unspecified injury of face, initial encounter: Secondary | ICD-10-CM | POA: Diagnosis not present

## 2022-05-01 ENCOUNTER — Ambulatory Visit
Admission: RE | Admit: 2022-05-01 | Discharge: 2022-05-01 | Disposition: A | Discharge status: Home / Self Care | Payer: Medicare HMO | Source: Ambulatory Visit | Attending: Family Medicine | Admitting: Family Medicine

## 2022-05-01 ENCOUNTER — Other Ambulatory Visit: Payer: Self-pay | Admitting: Family Medicine

## 2022-05-01 DIAGNOSIS — Z1231 Encounter for screening mammogram for malignant neoplasm of breast: Secondary | ICD-10-CM | POA: Diagnosis not present

## 2022-05-01 DIAGNOSIS — W19XXXA Unspecified fall, initial encounter: Secondary | ICD-10-CM

## 2022-05-03 ENCOUNTER — Other Ambulatory Visit: Payer: Self-pay | Admitting: Family Medicine

## 2022-05-03 DIAGNOSIS — R928 Other abnormal and inconclusive findings on diagnostic imaging of breast: Secondary | ICD-10-CM

## 2022-05-08 ENCOUNTER — Other Ambulatory Visit: Payer: Self-pay | Admitting: Family Medicine

## 2022-05-08 DIAGNOSIS — W19XXXA Unspecified fall, initial encounter: Secondary | ICD-10-CM

## 2022-05-10 ENCOUNTER — Ambulatory Visit
Admission: RE | Admit: 2022-05-10 | Discharge: 2022-05-10 | Disposition: A | Discharge status: Home / Self Care | Payer: Medicare HMO | Source: Ambulatory Visit | Attending: Family Medicine | Admitting: Family Medicine

## 2022-05-10 ENCOUNTER — Other Ambulatory Visit: Payer: Self-pay | Admitting: Endocrinology

## 2022-05-10 DIAGNOSIS — I672 Cerebral atherosclerosis: Secondary | ICD-10-CM | POA: Diagnosis not present

## 2022-05-10 DIAGNOSIS — W19XXXA Unspecified fall, initial encounter: Secondary | ICD-10-CM

## 2022-05-10 DIAGNOSIS — R296 Repeated falls: Secondary | ICD-10-CM | POA: Diagnosis not present

## 2022-05-15 ENCOUNTER — Ambulatory Visit: Payer: Medicare HMO | Admitting: Internal Medicine

## 2022-05-15 ENCOUNTER — Ambulatory Visit
Admission: RE | Admit: 2022-05-15 | Discharge: 2022-05-15 | Disposition: A | Discharge status: Home / Self Care | Payer: Medicare HMO | Source: Ambulatory Visit | Attending: Family Medicine | Admitting: Family Medicine

## 2022-05-15 DIAGNOSIS — R921 Mammographic calcification found on diagnostic imaging of breast: Secondary | ICD-10-CM | POA: Diagnosis not present

## 2022-05-15 DIAGNOSIS — R928 Other abnormal and inconclusive findings on diagnostic imaging of breast: Secondary | ICD-10-CM

## 2022-06-07 ENCOUNTER — Other Ambulatory Visit: Payer: Self-pay | Admitting: Endocrinology

## 2022-06-07 DIAGNOSIS — E890 Postprocedural hypothyroidism: Secondary | ICD-10-CM

## 2022-06-21 DIAGNOSIS — Z961 Presence of intraocular lens: Secondary | ICD-10-CM | POA: Diagnosis not present

## 2022-06-28 ENCOUNTER — Ambulatory Visit: Payer: Medicare HMO | Attending: Internal Medicine | Admitting: Internal Medicine

## 2022-06-28 ENCOUNTER — Encounter: Payer: Self-pay | Admitting: Internal Medicine

## 2022-06-28 VITALS — BP 130/58 | HR 75 | Ht 67.0 in | Wt 110.2 lb

## 2022-06-28 DIAGNOSIS — Z720 Tobacco use: Secondary | ICD-10-CM

## 2022-06-28 DIAGNOSIS — Z853 Personal history of malignant neoplasm of breast: Secondary | ICD-10-CM | POA: Diagnosis not present

## 2022-06-28 DIAGNOSIS — M791 Myalgia, unspecified site: Secondary | ICD-10-CM | POA: Diagnosis not present

## 2022-06-28 DIAGNOSIS — I25118 Atherosclerotic heart disease of native coronary artery with other forms of angina pectoris: Secondary | ICD-10-CM

## 2022-06-28 DIAGNOSIS — T466X5A Adverse effect of antihyperlipidemic and antiarteriosclerotic drugs, initial encounter: Secondary | ICD-10-CM

## 2022-06-28 DIAGNOSIS — I700 Atherosclerosis of aorta: Secondary | ICD-10-CM | POA: Diagnosis not present

## 2022-06-28 NOTE — Patient Instructions (Signed)
Medication Instructions:  Your physician recommends that you continue on your current medications as directed. Please refer to the Current Medication list given to you today.  *If you need a refill on your cardiac medications before your next appointment, please call your pharmacy*  Lab Work: NONE  Testing/Procedures: NONE  Follow-Up: At [facility], you and your health needs are our priority.  As part of our continuing mission to provide you with exceptional heart care, we have created designated Provider Care Teams.  These Care Teams include your primary Cardiologist (physician) and Advanced Practice Providers (APPs -  Physician Assistants and Nurse Practitioners) who all work together to provide you with the care you need, when you need it.  Your next appointment:   6 month(s) with APP 1 year with Dr. Gasper Sells  The format for your next appointment:   In Person  Provider:   Werner Lean, MD  or Robbie Lis, PA-C, Nicholes Rough, PA-C, Ambrose Pancoast, NP, Christen Bame, NP, or Richardson Dopp, PA-C       Important Information About Sugar

## 2022-06-28 NOTE — Progress Notes (Signed)
Cardiology Office Note:    Date:  06/28/2022   ID:  Victoria Blair, DOB [birth year], MRN [id number]  PCP:  Orpah Melter, MD   [facility] Providers Cardiologist:  Werner Lean, MD     Referring MD: Orpah Melter, MD   CC: f/u syncope and chest pain  History of Present Illness:    Victoria Blair is a 76 y.o. female with a hx of HTN, tobacco abuse and Breast Cancer (radiation dose unclear) who presents after syncope. 2023: - Echo (normal); - CT Mild non obstructive CAD, oRCA ~ 50% unable to model FFR.  Added PRN nitro and discussed potential LHC. No RCA ischemia on NM Stress.  Patient notes that she is doing good.   Has taken two nitro since last visit. There are no interval hospital/ED visit.    No chest pain or pressure .  No SOB/DOE and no PND/Orthopnea.  No weight gain or leg swelling.  No palpitations or syncope. Up to 3 cigarettes.  Running errands and goes to church with no symptoms.   Past Medical History:  Diagnosis Date   Breast cancer ([place]) 1997   Depression    GERD (gastroesophageal reflux disease)    Graves disease    Hemorrhoids    Hypertension    Migraines    Osteoporosis    Personal history of radiation therapy 1998   Vitamin D deficiency     Past Surgical History:  Procedure Laterality Date   BREAST BIOPSY Right 08/11/1996   BREAST LUMPECTOMY Right 1998   CESAREAN SECTION     CHOLECYSTECTOMY     SPINE SURGERY      Current Medications: Current Meds  Medication Sig   aspirin EC 81 MG tablet Take 1 tablet (81 mg total) by mouth daily. Swallow whole.   Calcium Carbonate-Vitamin D (CALCIUM + D PO) Take 1 tablet by mouth daily.    ezetimibe (ZETIA) 10 MG tablet Take 1 tablet (10 mg total) by mouth daily.   fish oil-omega-3 fatty acids 1000 MG capsule Take 1 g by mouth daily.    Homeopathic Products (LEG CRAMPS PO) Take 2 tablets by mouth 2 (two) times daily as needed (leg/hand cramps).   ibuprofen (ADVIL) 800 MG tablet Take  800 mg by mouth every 6 (six) hours as needed.   levothyroxine (SYNTHROID) 75 MCG tablet Take 1 tablet (75 mcg total) by mouth daily.   lisinopril (ZESTRIL) 5 MG tablet TAKE 1 TABLET BY MOUTH EVERY DAY   nitroGLYCERIN (NITROSTAT) 0.4 MG SL tablet Place 1 tablet (0.4 mg total) under the tongue every 5 (five) minutes as needed for chest pain (may take up to 3 tablets).   pravastatin (PRAVACHOL) 20 MG tablet Take 1 tablet (20 mg total) by mouth every evening.   Probiotic Product (PROBIOTIC DAILY) CAPS Take 1 capsule by mouth daily.   VITAMIN E PO Take 1 tablet by mouth daily. Unsure of dose     Allergies:   Hydrocodone-acetaminophen, Carbamide peroxide, Erythromycin base, and Lipitor [atorvastatin]   Social History   Socioeconomic History   Marital status: Divorced    Spouse name: Not on file   Number of children: Not on file   Years of education: Not on file   Highest education level: Not on file  Occupational History   Not on file  Tobacco Use   Smoking status: Every Day    Packs/day: 0.30    Years: 40.00    Total pack years: 12.00    Types:  Cigarettes   Smokeless tobacco: Never   Tobacco comments:    trying to quit. Pt states she is smoking about 5 cigarettes a day  Substance and Sexual Activity   Alcohol use: Yes    Alcohol/week: 0.0 standard drinks of alcohol    Comment: seldom   Drug use: No   Sexual activity: Yes  Other Topics Concern   Not on file  Social History Narrative   ** Merged History Encounter **       Social Determinants of Health   Financial Resource Strain: Not on file  Food Insecurity: Not on file  Transportation Needs: Not on file  Physical Activity: Not on file  Stress: Not on file  Social Connections: Not on file    Social: partner had esophageal cancer (deceased)  Family History: The patient's family history includes Diabetes in her maternal aunt; Heart disease in her father; Hypertension in her father; Pancreatic cancer in her sister;  Pulmonary fibrosis in her father; Sleep apnea in her son. There is no history of Breast cancer.  ROS:   Please see the history of present illness.     All other systems reviewed and are negative.  EKGs/Labs/Other Studies Reviewed:    Recent Labs: 11/11/2021: Hemoglobin 12.1; Platelets 259 03/27/2022: ALT 11 04/10/2022: BUN 19; Creatinine, Ser 1.02; Potassium 4.1; Sodium 140; TSH 1.09   EKG 12/25/21: SR rate 65   Cardiac Studies & Procedures     STRESS TESTS  MYOCARDIAL PERFUSION IMAGING 01/03/2022  Narrative   The study is normal. The study is low risk.   No ST deviation was noted.   LV perfusion is normal. There is no evidence of ischemia. There is no evidence of infarction.   Left ventricular function is normal. Nuclear stress EF: 67 %. The left ventricular ejection fraction is hyperdynamic (>65%). End diastolic cavity size is normal. End systolic cavity size is normal.   ECHOCARDIOGRAM  ECHOCARDIOGRAM COMPLETE 12/11/2021  Narrative ECHOCARDIOGRAM REPORT    Patient Name:   Victoria Blair Date of Exam: 12/11/2021 Medical Rec #:  [id number]          Height:       67.0 in Accession #:    [id number]         Weight:       115.0 lb Date of Birth:  29-Sep-1945           BSA:          1.598 m Patient Age:    8 years           BP:           120/62 mmHg Patient Gender: F                  HR:           81 bpm. Exam Location:  [address]  Procedure: 2D Echo, Cardiac Doppler and Color Doppler  Indications:     R55 Near Syncope  History:         Patient has no prior history of Echocardiogram examinations. Risk Factors:Hypertension. History of breast cancer.  Sonographer:     Cresenciano Lick RDCS Referring Phys:  [id number] [facility] Ernst Spell Diagnosing Phys: Oswaldo Milian MD  IMPRESSIONS   1. Left ventricular ejection fraction, by estimation, is 60 to 65%. The left ventricle has normal function. The left ventricle has no regional wall motion abnormalities.  There is moderate asymmetric left ventricular hypertrophy of the basal-septal segment. Left ventricular diastolic  function could not be evaluated. 2. Right ventricular systolic function is normal. The right ventricular size is normal. There is normal pulmonary artery systolic pressure. The estimated right ventricular systolic pressure is 79.8 mmHg. 3. The mitral valve is normal in structure. No evidence of mitral valve regurgitation. 4. The aortic valve is tricuspid. Aortic valve regurgitation is mild. No aortic stenosis is present. 5. The inferior vena cava is normal in size with greater than 50% respiratory variability, suggesting right atrial pressure of 3 mmHg.  FINDINGS Left Ventricle: Left ventricular ejection fraction, by estimation, is 60 to 65%. The left ventricle has normal function. The left ventricle has no regional wall motion abnormalities. The left ventricular internal cavity size was small. There is moderate asymmetric left ventricular hypertrophy of the basal-septal segment. Left ventricular diastolic function could not be evaluated.  Right Ventricle: The right ventricular size is normal. No increase in right ventricular wall thickness. Right ventricular systolic function is normal. There is normal pulmonary artery systolic pressure. The tricuspid regurgitant velocity is 2.11 m/s, and with an assumed right atrial pressure of 3 mmHg, the estimated right ventricular systolic pressure is 92.1 mmHg.  Left Atrium: Left atrial size was normal in size.  Right Atrium: Right atrial size was normal in size.  Pericardium: Trivial pericardial effusion is present.  Mitral Valve: The mitral valve is normal in structure. No evidence of mitral valve regurgitation.  Tricuspid Valve: The tricuspid valve is normal in structure. Tricuspid valve regurgitation is mild.  Aortic Valve: The aortic valve is tricuspid. Aortic valve regurgitation is mild. Aortic regurgitation PHT measures 383 msec. No  aortic stenosis is present.  Pulmonic Valve: The pulmonic valve was not well visualized. Pulmonic valve regurgitation is not visualized.  Aorta: The aortic root and ascending aorta are structurally normal, with no evidence of dilitation.  Venous: The inferior vena cava is normal in size with greater than 50% respiratory variability, suggesting right atrial pressure of 3 mmHg.  IAS/Shunts: The interatrial septum was not well visualized.   LEFT VENTRICLE PLAX 2D LVIDd:         3.30 cm LVIDs:         2.10 cm LV PW:         1.20 cm LV IVS:        1.20 cm LVOT diam:     1.80 cm LV SV:         52 LV SV Index:   32 LVOT Area:     2.54 cm   RIGHT VENTRICLE RV Basal diam:  3.50 cm RV S prime:     14.15 cm/s TAPSE (M-mode): 1.9 cm  LEFT ATRIUM             Index        RIGHT ATRIUM           Index LA diam:        3.10 cm 1.94 cm/m   RA Area:     13.00 cm LA Vol (A2C):   48.1 ml 30.09 ml/m  RA Volume:   33.40 ml  20.90 ml/m LA Vol (A4C):   33.4 ml 20.90 ml/m LA Biplane Vol: 42.6 ml 26.65 ml/m AORTIC VALVE LVOT Vmax:   99.35 cm/s LVOT Vmean:  67.750 cm/s LVOT VTI:    0.202 m AI PHT:      383 msec  AORTA Ao Root diam: 3.40 cm Ao Asc diam:  3.20 cm  MITRAL VALVE  TRICUSPID VALVE MV Area (PHT): 3.42 cm     TR Peak grad:   17.8 mmHg MV Decel Time: 222 msec     TR Vmax:        211.00 cm/s MV E velocity: 75.20 cm/s MV A velocity: 101.65 cm/s  SHUNTS MV E/A ratio:  0.74         Systemic VTI:  0.20 m Systemic Diam: 1.80 cm  Oswaldo Milian MD Electronically signed by Oswaldo Milian MD Signature Date/Time: 12/11/2021/3:41:23 PM    Final (Updated)     CT SCANS  CT CORONARY MORPH W/CTA COR W/SCORE 12/12/2021  Addendum 12/12/2021  3:06 PM ADDENDUM REPORT: 12/12/2021 15:04  CLINICAL DATA:  This is a 76 year old female with anginal symptoms.  EXAM: Cardiac/Coronary  CTA  TECHNIQUE: The patient was scanned on a [employer] .  FINDINGS: A 100 kV prospective scan was triggered in the descending thoracic aorta at 111 HU's. Axial non-contrast 3 mm slices were carried out through the heart. The data set was analyzed on a dedicated work station and scored using the [place]. Gantry rotation speed was 250 msecs and collimation was .6 mm. No beta blockade and 0.8 mg of sl NTG was given. The 3D data set was reconstructed in 5% intervals of the 67-82 % of the R-R cycle. Diastolic phases were analyzed on a dedicated work station using MPR, MIP and VRT modes. The patient received 80 cc of contrast.  Image Quality: Fair, with misregistration artifact.  Aorta: Normal size.  No calcifications.  No dissection.  Aortic Valve:  Trileaflet.  No calcifications.  Coronary Arteries:  Normal coronary origin.  Right dominance.  RCA is a large dominant artery that gives rise to PDA and PLA. The RCA is diffusely calcified. There is a long (27 mm) calcified plaque which starts as a mild (25-49%) in the ostium of the RCA and terminates as a moderate (50-69%) plaque. The mid RCA with a moderate calcified plaque in the mid RCA. Due to multiple stair-step artifact the distal RCA is not well visualized- there is calcified plaque present that can not be accurately quantified.  Left main is a large artery that gives rise to LAD and LCX arteries.  LAD is a large vessel. The proximal LAD with mild calcified lesions. The mid LAD with 2 separate plaques: There is a focal mild soft plaque and right below this is a long 14.4 mm moderate calcified plaque. The distal LAD with minimal luminal irregularities. D1 with mild calcified plaque in the proximal portion of the vessel.  LCX is a non-dominant artery that gives rise to one large OM1 branch. There is moderate mixed plaque in the proximal LCX. The mid and distal LCX with no plaques.  Coronary Calcium Score:  Left main: 0  Left anterior descending artery: 540  Left  circumflex artery: 49  Right coronary artery: 854  Total: 1443  Percentile: 99  Other findings:  Normal pulmonary vein drainage into the left atrium.  Normal left atrial appendage without a thrombus.  Normal size of the pulmonary artery.  Mild mitral annular calcification  IMPRESSION: 1. Coronary calcium score of 1443. This was 46 percentile for age and sex matched control.  2. Normal coronary origin with right dominance.  3. CAD-RADS 3. Moderate stenosis. Consider symptom-guided anti-ischemic pharmacotherapy as well as risk factor modification per guideline directed care. Additional analysis with CT FFR will be submitted.  The noncardiac portion of this study will be interpreted in separate  report by the radiologist.   Electronically Signed By: Berniece Salines D.O. On: 12/12/2021 15:04  Narrative EXAM: OVER-READ INTERPRETATION  CT CHEST  The following report is an over-read performed by radiologist Dr. Yetta Glassman of [facility], [place] on 12/12/2021. This over-read does not include interpretation of cardiac or coronary anatomy or pathology. The coronary calcium score/coronary CTA interpretation by the cardiologist is attached.  COMPARISON:  Multiple priors, most recent chest CT dated January 25, 2017  FINDINGS: Vascular: Normal heart size. Normal caliber thoracic aorta with moderate atherosclerotic disease. No suspicious filling defects of the visualized pulmonary arteries.  Mediastinum/Nodes: Esophagus is unremarkable. No pathologically enlarged lymph nodes seen in the chest.  Lungs/Pleura: Central airways are patent. Right lower lobe patchy consolidations. Left lower lobe nodule measuring 5 mm on series 11, image 30, unchanged size of compared with priors dating back to 2015, favored to be benign. Centrilobular emphysema. No evidence of pleural effusion or pneumothorax.  Upper Abdomen: No acute abnormality.  Musculoskeletal: No chest wall mass or  suspicious bone lesions identified.  IMPRESSION: 1. Patchy right lower lobe consolidations, likely due to infection or aspiration. Recommend follow-up chest CT in 3 months to ensure resolution. 2. Aortic Atherosclerosis (ICD10-I70.0) and Emphysema (ICD10-J43.9).  Electronically Signed: By: Yetta Glassman M.D. On: 12/12/2021 12:14           Recent Lipid Panel    Component Value Date/Time   CHOL 108 03/27/2022 0743   TRIG 66 03/27/2022 0743   HDL 39 (L) 03/27/2022 0743   CHOLHDL 2.8 03/27/2022 0743   CHOLHDL 4 12/28/2019 0820   VLDL 18.0 12/28/2019 0820   LDLCALC 55 03/27/2022 0743       Physical Exam:    VS:  BP (!) 130/58   Pulse 75   Ht '5\' 7"'$  (1.702 m)   Wt 110 lb 3.2 oz (50 kg)   SpO2 98%   BMI 17.26 kg/m     Wt Readings from Last 3 Encounters:  06/28/22 110 lb 3.2 oz (50 kg)  04/12/22 111 lb 9.6 oz (50.6 kg)  01/10/22 110 lb (49.9 kg)    Gen: no distress  Neck: No JVD Cardiac: No Rubs or Gallops, no murmur, RRR +2 radial pulses Respiratory: Clear to auscultation bilaterally, normal effort, normal  respiratory rate GI: Soft, nontender, non-distended  MS: No  edema;  moves all extremities Integument: Skin feels warm Neuro:  At time of evaluation, alert and oriented to person/place/time/situation  Psych: Normal affect, patient feels warm  ASSESSMENT:    1. Coronary artery disease of native artery of native heart with stable angina pectoris ([place])   2. Aortic atherosclerosis ([place])   3. BREAST CANCER, HX OF   4. Tobacco abuse   5. Myalgia due to statin     PLAN:    Moderate non obstructive CAD Breast Cancer with prior radiation Tobacco abuse- discussed cessation (has worked to cut down) HLD with prior statin myopathy - we have once again discussed LHC; after discussion will only proceed if angina - ASA 81 mg  - Zetia 10 mg;  Lipitor myalgias - controlled on current therapy  - BP controled on lisinopril 5 mg - continue PRN nitro  Six months  APP; one year with me       Medication Adjustments/Labs and Tests Ordered: Current medicines are reviewed at length with the patient today.  Concerns regarding medicines are outlined above.  No orders of the defined types were placed in this encounter.  No orders  of the defined types were placed in this encounter.   Patient Instructions  Medication Instructions:  Your physician recommends that you continue on your current medications as directed. Please refer to the Current Medication list given to you today.  *If you need a refill on your cardiac medications before your next appointment, please call your pharmacy*  Lab Work: NONE  Testing/Procedures: NONE  Follow-Up: At [facility], you and your health needs are our priority.  As part of our continuing mission to provide you with exceptional heart care, we have created designated Provider Care Teams.  These Care Teams include your primary Cardiologist (physician) and Advanced Practice Providers (APPs -  Physician Assistants and Nurse Practitioners) who all work together to provide you with the care you need, when you need it.  Your next appointment:   6 month(s) with APP 1 year with Dr. Gasper Sells  The format for your next appointment:   In Person  Provider:   Werner Lean, MD  or Robbie Lis, PA-C, Nicholes Rough, PA-C, Ambrose Pancoast, NP, Christen Bame, NP, or Richardson Dopp, PA-C       Important Information About Sugar         Signed, Werner Lean, MD  06/28/2022 10:26 AM    [place]

## 2022-07-03 DIAGNOSIS — L814 Other melanin hyperpigmentation: Secondary | ICD-10-CM | POA: Diagnosis not present

## 2022-07-03 DIAGNOSIS — B009 Herpesviral infection, unspecified: Secondary | ICD-10-CM | POA: Diagnosis not present

## 2022-07-03 DIAGNOSIS — R413 Other amnesia: Secondary | ICD-10-CM | POA: Diagnosis not present

## 2022-07-03 DIAGNOSIS — L578 Other skin changes due to chronic exposure to nonionizing radiation: Secondary | ICD-10-CM | POA: Diagnosis not present

## 2022-07-03 DIAGNOSIS — D485 Neoplasm of uncertain behavior of skin: Secondary | ICD-10-CM | POA: Diagnosis not present

## 2022-07-03 DIAGNOSIS — D225 Melanocytic nevi of trunk: Secondary | ICD-10-CM | POA: Diagnosis not present

## 2022-07-03 DIAGNOSIS — L821 Other seborrheic keratosis: Secondary | ICD-10-CM | POA: Diagnosis not present

## 2022-07-04 ENCOUNTER — Other Ambulatory Visit: Payer: Self-pay | Admitting: Endocrinology

## 2022-07-04 DIAGNOSIS — E890 Postprocedural hypothyroidism: Secondary | ICD-10-CM

## 2022-07-04 MED ORDER — LEVOTHYROXINE SODIUM 75 MCG PO TABS: 75.0000 ug | ORAL_TABLET | Freq: Every day | ORAL | 3 refills | Status: DC

## 2022-07-31 ENCOUNTER — Other Ambulatory Visit: Payer: Self-pay | Admitting: Endocrinology

## 2022-07-31 DIAGNOSIS — E890 Postprocedural hypothyroidism: Secondary | ICD-10-CM

## 2022-08-14 DIAGNOSIS — H6123 Impacted cerumen, bilateral: Secondary | ICD-10-CM | POA: Diagnosis not present

## 2022-09-08 DIAGNOSIS — R0789 Other chest pain: Secondary | ICD-10-CM | POA: Diagnosis not present

## 2022-09-10 ENCOUNTER — Other Ambulatory Visit (INDEPENDENT_AMBULATORY_CARE_PROVIDER_SITE_OTHER): Payer: Medicare HMO

## 2022-09-10 DIAGNOSIS — E559 Vitamin D deficiency, unspecified: Secondary | ICD-10-CM

## 2022-09-10 DIAGNOSIS — I10 Essential (primary) hypertension: Secondary | ICD-10-CM | POA: Diagnosis not present

## 2022-09-10 DIAGNOSIS — E890 Postprocedural hypothyroidism: Secondary | ICD-10-CM | POA: Diagnosis not present

## 2022-09-10 LAB — TSH: TSH: 1.57 u[IU]/mL (ref 0.35–5.50)

## 2022-09-10 LAB — BASIC METABOLIC PANEL
BUN: 32 mg/dL — ABNORMAL HIGH (ref 6–23)
CO2: 29 mEq/L (ref 19–32)
Calcium: 9.2 mg/dL (ref 8.4–10.5)
Chloride: 103 mEq/L (ref 96–112)
Creatinine, Ser: 1.03 mg/dL (ref 0.40–1.20)
GFR: 53.55 mL/min — ABNORMAL LOW (ref >60.00)
Glucose, Bld: 98 mg/dL (ref 70–99)
Potassium: 4.1 mEq/L (ref 3.5–5.1)
Sodium: 140 mEq/L (ref 135–145)

## 2022-09-10 LAB — VITAMIN D 25 HYDROXY (VIT D DEFICIENCY, FRACTURES): VITD: 32.69 ng/mL (ref 30.00–100.00)

## 2022-09-10 LAB — T4, FREE: Free T4: 1.24 ng/dL (ref 0.60–1.60)

## 2022-09-12 ENCOUNTER — Encounter: Payer: Self-pay | Admitting: Endocrinology

## 2022-09-12 ENCOUNTER — Ambulatory Visit: Payer: Medicare HMO | Admitting: Endocrinology

## 2022-09-12 ENCOUNTER — Telehealth: Payer: Self-pay | Admitting: Pharmacy Technician

## 2022-09-12 VITALS — BP 122/64 | HR 68 | Ht 67.0 in | Wt 119.0 lb

## 2022-09-12 DIAGNOSIS — E890 Postprocedural hypothyroidism: Secondary | ICD-10-CM | POA: Diagnosis not present

## 2022-09-12 DIAGNOSIS — I10 Essential (primary) hypertension: Secondary | ICD-10-CM

## 2022-09-12 DIAGNOSIS — M810 Age-related osteoporosis without current pathological fracture: Secondary | ICD-10-CM | POA: Diagnosis not present

## 2022-09-12 DIAGNOSIS — E559 Vitamin D deficiency, unspecified: Secondary | ICD-10-CM | POA: Diagnosis not present

## 2022-09-12 NOTE — Telephone Encounter (Signed)
Auth Submission: NO AUTH NEEDED Payer: [employer] Medication & CPT/J Code(s) submitted: Reclast (Zolendronic acid) [id number] Route of submission (phone, fax, portal):  Phone # Fax # Auth type: Buy/Bill Units/visits requested: 1 Reference number:  Approval from: 09/12/22 to 08/27/23

## 2022-09-12 NOTE — Progress Notes (Signed)
Patient ID: Victoria Blair, female   DOB: [birth year], 77 y.o.   MRN: [id number]    Reason for Appointment:  Follow-up visit for various problems   History of Present Illness:    OSTEOPOROSIS; previously was given Actonel but stopped this because of difficulty with compliance.  T score at spine was -2.8 in October 2018  T score at the spine in 06/2019 was -3.1 and declined 4.6%  Results from 06/2021:   Lumbar spine L1-L4 Femoral neck (FN) 33% distal radius  T-score -2.7 RFN: -1.9 LFN: -2.6 n/a  Change in BMD from previous DXA test (%) Up 5.9% Down 5.3% n/a    In her youth she was 5 feet 7-1/2 inches tall  She previously had been taking Evista since 5/09.  This was restarted on her visit in 05/2018 Subsequently stopped this because of cost  More recently has been treated with RECLAST:  She had the first infusion in 07/2019 without any side effect She had a second infusion of 5 g Reclast on 08/31/2020 Third infusion was on 09/12/2021 She has tolerated the infusions well  Vitamin D deficiency:  She has been taking 2000 Units for her vitamin D deficiency  Lab Results  Component Value Date   VD25OH 32.69 09/10/2022   VD25OH 30.65 09/04/2021   VD25OH 48.76 03/27/2021   VD25OH 30.62 07/25/2020     HYPOTHYROIDISM: This was first diagnosed in 1978 after [id number] treatment for Graves' disease          Her dosages have been adjusted periodically  Her dose was adjusted last in 8/22 when her TSH was relatively high She is now taking 75 micrograms daily  Her dose has been reduced in 2023 Did not feel any different with changing her doses No complaints of fatigue now, 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   Blood pressure was low normal in 11/2017 and her Lotrel was stopped  Now taking lisinopril 5 mg daily along with metoprolol from cardiology    BP Readings from Last 3 Encounters:  09/12/22 122/64  06/28/22 (!) 130/58  04/12/22 130/72     Allergies as of 09/12/2022       Reactions   Hydrocodone-acetaminophen    Patient passed out with this medication   Carbamide Peroxide    Erythromycin Base Other (See Comments)   Other   Lipitor [atorvastatin]    Myalgias        Medication List        Accurate as of September 12, 2022  9:21 AM. If you have any questions, ask your nurse or doctor.          aspirin EC 81 MG tablet Take 1 tablet (81 mg total) by mouth daily. Swallow whole.   Brewers Yeast 487.5 MG  Tabs two tablets Orally once a day   CALCIUM + D PO Take 1 tablet by mouth daily.   ezetimibe 10 MG tablet Commonly known as: ZETIA Take 1 tablet (10 mg total) by mouth daily.   fish oil-omega-3 fatty acids 1000 MG capsule Take 1 g by mouth daily.   Garlic 938 MG Caps 1 capsule Orally once a day   ibuprofen 800 MG tablet Commonly known as: ADVIL Take 800 mg by mouth every 6 (six) hours as needed.   LEG CRAMPS PO Take 2 tablets by mouth 2 (two) times daily as needed (leg/hand cramps).   levothyroxine 75 MCG tablet Commonly known as: SYNTHROID Take 1 tablet (75 mcg total) by mouth daily.   lisinopril 5 MG tablet Commonly known as: ZESTRIL TAKE 1 TABLET BY MOUTH EVERY DAY   metoprolol tartrate 100 MG tablet Commonly known as: LOPRESSOR 1 tablet with food Orally Twice a day   nitroGLYCERIN 0.4 MG SL tablet Commonly known as: NITROSTAT Place 1  tablet (0.4 mg total) under the tongue every 5 (five) minutes as needed for chest pain (may take up to 3 tablets).   pravastatin 20 MG tablet Commonly known as: PRAVACHOL Take 1 tablet (20 mg total) by mouth every evening.   PreserVision AREDS Caps 2 capsules Orally once a day   Probiotic Daily Caps Take 1 capsule by mouth daily.   Sodium Fluoride 5000 Plus 1.1 % Crea dental cream Generic drug: sodium fluoride as directed Dental daily   valACYclovir 500 MG tablet Commonly known as: VALTREX Take 500 mg by mouth daily.   Vitamin C 500 MG Caps 1 tablet Orally Once a day   VITAMIN E PO Take 1 tablet by mouth daily. Unsure of dose        Past Medical History:  Diagnosis Date   Breast cancer ([place]) 1997   Depression    GERD (gastroesophageal reflux disease)    Graves disease    Hemorrhoids    Hypertension    Migraines    Osteoporosis    Personal history of radiation therapy 1998   Vitamin D deficiency     Past Surgical History:  Procedure Laterality Date   BREAST BIOPSY Right 08/11/1996   BREAST LUMPECTOMY Right 1998   CESAREAN SECTION     CHOLECYSTECTOMY     SPINE SURGERY      Right breast cancer treated with lumpectomy and radiation 2000, lumbar spine surgery     Family History  Problem Relation Age of Onset   Heart disease Father    Pulmonary fibrosis Father    Hypertension Father    Pancreatic cancer Sister    Diabetes Maternal Aunt    Sleep apnea Son    Breast cancer Neg Hx     Social History:  reports that she has been smoking cigarettes. She has a 12.00 pack-year smoking history. She has never used smokeless tobacco. She reports current alcohol use. She reports that she does not use drugs.  Allergies:  Allergies  Allergen Reactions   Hydrocodone-Acetaminophen     Patient passed out with this medication   Carbamide Peroxide    Erythromycin Base Other (See Comments)    Other   Lipitor [Atorvastatin]     Myalgias    REVIEW of  systems:  Weight history:   Wt Readings from Last 3 Encounters:  09/12/22 119 lb (54 kg)  06/28/22 110 lb 3.2 oz (50 kg)  04/12/22 111 lb 9.6 oz (50.6 kg)  Hyperlipidemia: She has had hypercholesterolemia Now managed by cardiology with pravastatin and Zetia   Lab Results  Component Value Date   CHOL 108 03/27/2022   CHOL 188 12/28/2019   CHOL 187 06/24/2019   Lab Results  Component Value Date   HDL 39 (L) 03/27/2022   HDL 46.60 12/28/2019   HDL 48.20 06/24/2019   Lab Results  Component Value Date   LDLCALC 55 03/27/2022   LDLCALC 124 (H) 12/28/2019   LDLCALC 122 (H) 06/24/2019   Lab Results  Component Value Date   TRIG 66 03/27/2022   TRIG 90.0 12/28/2019   TRIG 84.0 06/24/2019   Lab Results  Component Value Date   CHOLHDL 2.8 03/27/2022   CHOLHDL 4 12/28/2019   CHOLHDL 4 06/24/2019   No results found for: "LDLDIRECT"        Examination:   BP 122/64   Pulse 68   Ht '5\' 7"'$  (1.702 m)   Wt 119 lb (54 kg)   SpO2 96%   BMI 18.64 kg/m       Assessments / PLAN:   Hypertension: Her blood pressure is well-controlled  Osteoporosis:   Lowest T-score was -3.1 at the spine Treated with Reclast annually with improvement in spine bone density as above Next treatment is due in 08/2022 Will refer to the infusion center, discussed need to do 5 infusions consequently and then take 1 year drug holiday  Vitamin D adequately repleted  HYPOTHYROIDISM, post ablative, long-standing  Currently on 75 mcg since 5/23 TSH is back to normal consistently now Reminded her to take her levothyroxine consistently every morning without food  Continue the same dose   Elayne Snare 09/12/2022, 9:21 AM

## 2022-09-16 ENCOUNTER — Other Ambulatory Visit: Payer: Self-pay | Admitting: Endocrinology

## 2022-09-16 ENCOUNTER — Other Ambulatory Visit: Payer: Self-pay | Admitting: Internal Medicine

## 2022-09-16 DIAGNOSIS — E890 Postprocedural hypothyroidism: Secondary | ICD-10-CM

## 2022-09-17 ENCOUNTER — Telehealth: Payer: Self-pay | Admitting: Pulmonary Disease

## 2022-09-17 NOTE — Telephone Encounter (Signed)
PT having a dental appt (Reclasp?) tomorrow and needs Dr. Cari Caraway. Pls call @ [phone]

## 2022-09-17 NOTE — Telephone Encounter (Signed)
If pt has not been seen at the office since 03/2017. We will not be able to provide patient clearance until she is seen for a visit.  Pt can always check with PCP to see if they would do clearance. Attempted to call pt but unable to reach. Left message for her to return call.

## 2022-09-18 ENCOUNTER — Ambulatory Visit (INDEPENDENT_AMBULATORY_CARE_PROVIDER_SITE_OTHER): Payer: Medicare HMO

## 2022-09-18 VITALS — BP 120/61 | HR 69 | Temp 97.8°F | Resp 18 | Ht 67.0 in | Wt 122.6 lb

## 2022-09-18 DIAGNOSIS — M810 Age-related osteoporosis without current pathological fracture: Secondary | ICD-10-CM | POA: Diagnosis not present

## 2022-09-18 MED ORDER — ZOLEDRONIC ACID 5 MG/100ML IV SOLN
5.0000 mg | Freq: Once | INTRAVENOUS | Status: AC
  Administered 2022-09-18: 5 mg via INTRAVENOUS
  Filled 2022-09-18: qty 100

## 2022-09-18 MED ORDER — ACETAMINOPHEN 325 MG PO TABS
650.0000 mg | ORAL_TABLET | Freq: Once | ORAL | Status: AC
  Administered 2022-09-18: 650 mg via ORAL
  Filled 2022-09-18: qty 2

## 2022-09-18 MED ORDER — SODIUM CHLORIDE 0.9 % IV SOLN: INTRAVENOUS | Status: DC

## 2022-09-18 NOTE — Patient Instructions (Signed)

## 2022-09-18 NOTE — Progress Notes (Signed)
Diagnosis: Osteoporosis  Provider:  Marshell Garfinkel MD  Procedure: Infusion  IV Type: Peripheral, IV Location: L Antecubital  Reclast (Zolendronic Acid), Dose: 5 mg  Infusion Start Time: 2130  Infusion Stop Time: 1225  Post Infusion IV Care: Observation period completed and Peripheral IV Discontinued  Discharge: Condition: Good, Destination: Home . AVS provided to patient.   Performed by:  Koren Shiver, RN

## 2022-09-27 ENCOUNTER — Telehealth: Payer: Self-pay | Admitting: Internal Medicine

## 2022-09-27 NOTE — Telephone Encounter (Signed)
Pt states that she is returning a call from the other day. Please advise

## 2022-10-22 DIAGNOSIS — H35453 Secondary pigmentary degeneration, bilateral: Secondary | ICD-10-CM | POA: Diagnosis not present

## 2022-10-22 DIAGNOSIS — H35363 Drusen (degenerative) of macula, bilateral: Secondary | ICD-10-CM | POA: Diagnosis not present

## 2022-10-22 DIAGNOSIS — H35372 Puckering of macula, left eye: Secondary | ICD-10-CM | POA: Diagnosis not present

## 2022-10-22 DIAGNOSIS — Z961 Presence of intraocular lens: Secondary | ICD-10-CM | POA: Diagnosis not present

## 2022-10-22 DIAGNOSIS — H26492 Other secondary cataract, left eye: Secondary | ICD-10-CM | POA: Diagnosis not present

## 2022-10-22 DIAGNOSIS — H353131 Nonexudative age-related macular degeneration, bilateral, early dry stage: Secondary | ICD-10-CM | POA: Diagnosis not present

## 2022-10-23 ENCOUNTER — Other Ambulatory Visit: Payer: Self-pay | Admitting: Endocrinology

## 2022-10-23 DIAGNOSIS — E890 Postprocedural hypothyroidism: Secondary | ICD-10-CM

## 2022-10-29 ENCOUNTER — Emergency Department (HOSPITAL_COMMUNITY)
Admission: EM | Admit: 2022-10-29 | Discharge: 2022-10-29 | Disposition: A | Discharge status: Home / Self Care | Payer: Medicare HMO | Attending: Student | Admitting: Student

## 2022-10-29 ENCOUNTER — Other Ambulatory Visit: Payer: Self-pay

## 2022-10-29 ENCOUNTER — Encounter (HOSPITAL_COMMUNITY): Payer: Self-pay

## 2022-10-29 DIAGNOSIS — I10 Essential (primary) hypertension: Secondary | ICD-10-CM | POA: Diagnosis not present

## 2022-10-29 DIAGNOSIS — R55 Syncope and collapse: Secondary | ICD-10-CM

## 2022-10-29 DIAGNOSIS — Z79899 Other long term (current) drug therapy: Secondary | ICD-10-CM | POA: Insufficient documentation

## 2022-10-29 DIAGNOSIS — Z743 Need for continuous supervision: Secondary | ICD-10-CM | POA: Diagnosis not present

## 2022-10-29 DIAGNOSIS — I2510 Atherosclerotic heart disease of native coronary artery without angina pectoris: Secondary | ICD-10-CM | POA: Insufficient documentation

## 2022-10-29 DIAGNOSIS — R42 Dizziness and giddiness: Secondary | ICD-10-CM | POA: Diagnosis not present

## 2022-10-29 DIAGNOSIS — Z7982 Long term (current) use of aspirin: Secondary | ICD-10-CM | POA: Diagnosis not present

## 2022-10-29 DIAGNOSIS — E039 Hypothyroidism, unspecified: Secondary | ICD-10-CM | POA: Diagnosis not present

## 2022-10-29 DIAGNOSIS — R69 Illness, unspecified: Secondary | ICD-10-CM | POA: Diagnosis not present

## 2022-10-29 DIAGNOSIS — W19XXXA Unspecified fall, initial encounter: Secondary | ICD-10-CM | POA: Diagnosis not present

## 2022-10-29 DIAGNOSIS — Z853 Personal history of malignant neoplasm of breast: Secondary | ICD-10-CM | POA: Insufficient documentation

## 2022-10-29 DIAGNOSIS — I959 Hypotension, unspecified: Secondary | ICD-10-CM | POA: Diagnosis not present

## 2022-10-29 DIAGNOSIS — E860 Dehydration: Secondary | ICD-10-CM | POA: Diagnosis not present

## 2022-10-29 DIAGNOSIS — F17210 Nicotine dependence, cigarettes, uncomplicated: Secondary | ICD-10-CM | POA: Insufficient documentation

## 2022-10-29 LAB — COMPREHENSIVE METABOLIC PANEL
ALT: 23 U/L (ref 0–44)
AST: 38 U/L (ref 15–41)
Albumin: 3.3 g/dL — ABNORMAL LOW (ref 3.5–5.0)
Alkaline Phosphatase: 30 U/L — ABNORMAL LOW (ref 38–126)
Anion gap: 8 (ref 5–15)
BUN: 24 mg/dL — ABNORMAL HIGH (ref 8–23)
CO2: 22 mmol/L (ref 22–32)
Calcium: 7.7 mg/dL — ABNORMAL LOW (ref 8.9–10.3)
Chloride: 109 mmol/L (ref 98–111)
Creatinine, Ser: 1.09 mg/dL — ABNORMAL HIGH (ref 0.44–1.00)
GFR, Estimated: 53 mL/min — ABNORMAL LOW (ref >60)
Glucose, Bld: 109 mg/dL — ABNORMAL HIGH (ref 70–99)
Potassium: 4.2 mmol/L (ref 3.5–5.1)
Sodium: 139 mmol/L (ref 135–145)
Total Bilirubin: 0.3 mg/dL (ref 0.3–1.2)
Total Protein: 5.9 g/dL — ABNORMAL LOW (ref 6.5–8.1)

## 2022-10-29 LAB — CBC WITH DIFFERENTIAL/PLATELET
Abs Immature Granulocytes: 0.01 10*3/uL (ref 0.00–0.07)
Basophils Absolute: 0 10*3/uL (ref 0.0–0.1)
Basophils Relative: 0 %
Eosinophils Absolute: 0 10*3/uL (ref 0.0–0.5)
Eosinophils Relative: 0 %
HCT: 33 % — ABNORMAL LOW (ref 36.0–46.0)
Hemoglobin: 10.6 g/dL — ABNORMAL LOW (ref 12.0–15.0)
Immature Granulocytes: 0 %
Lymphocytes Relative: 25 %
Lymphs Abs: 1.3 10*3/uL (ref 0.7–4.0)
MCH: 31.3 pg (ref 26.0–34.0)
MCHC: 32.1 g/dL (ref 30.0–36.0)
MCV: 97.3 fL (ref 80.0–100.0)
Monocytes Absolute: 0.8 10*3/uL (ref 0.1–1.0)
Monocytes Relative: 16 %
Neutro Abs: 3 10*3/uL (ref 1.7–7.7)
Neutrophils Relative %: 59 %
Platelets: 163 10*3/uL (ref 150–400)
RBC: 3.39 MIL/uL — ABNORMAL LOW (ref 3.87–5.11)
RDW: 14 % (ref 11.5–15.5)
WBC: 5.2 10*3/uL (ref 4.0–10.5)
nRBC: 0 % (ref 0.0–0.2)

## 2022-10-29 LAB — CBG MONITORING, ED: Glucose-Capillary: 104 mg/dL — ABNORMAL HIGH (ref 70–99)

## 2022-10-29 LAB — TROPONIN I (HIGH SENSITIVITY): Troponin I (High Sensitivity): 8 ng/L (ref <18)

## 2022-10-29 MED ORDER — LACTATED RINGERS IV BOLUS
1000.0000 mL | Freq: Once | INTRAVENOUS | Status: AC
  Administered 2022-10-29: 1000 mL via INTRAVENOUS

## 2022-10-29 MED ORDER — ALBUMIN HUMAN 25 % IV SOLN
25.0000 g | Freq: Once | INTRAVENOUS | Status: AC
  Administered 2022-10-29: 25 g via INTRAVENOUS
  Filled 2022-10-29 (×2): qty 100

## 2022-10-29 NOTE — ED Triage Notes (Signed)
Pt BIB EMS from [place] with c/o witnessed syncopal episode. Does not remember falling but she did hit her head and has a small laceration under left eye. Denies any pain, SOB, N/V/D.  BP 85/50 CBG 143

## 2022-10-29 NOTE — ED Provider Notes (Signed)
[place] EMERGENCY DEPARTMENT AT [facility] Provider Note  CSN: [id number] Arrival date & time: 10/29/22 [id number]  Chief Complaint(s) No chief complaint on file.  HPI Victoria Blair is a 77 y.o. female with PMH breast cancer, GERD, Graves' disease, HTN, CAD, osteoporosis who presents emergency department for evaluation of a syncopal episode.  Patient states that she was at [facility] making copies when she had a syncopal episode.  EMS found her to be hypotensive in the field with systolics in the [id number] and had positive orthostatic vital signs.  Patient received 800 cc of normal saline prior to arrival blood pressures appear improved here.  Initial blood pressure 105/42.  Here in the emergency room, patient endorses no chest pain, shortness of breath, abdominal pain, nausea, vomiting or other systemic symptoms.  She states that she has passed out like this before when she has not eaten or drank and feels that this may be the case today.   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 Authorizing Provider  Ascorbic Acid (VITAMIN C) 500 MG CAPS 1 tablet Orally Once a day    [provider]  aspirin EC 81 MG tablet Take 1 tablet (81 mg total) by mouth daily. Swallow whole. 11/23/21   Werner Lean, MD  Brewers Yeast 487.5 MG TABS two tablets Orally once a day    [provider]  Calcium Carbonate-Vitamin D (CALCIUM + D PO) Take 1 tablet by mouth daily.     [provider]  ezetimibe (ZETIA) 10 MG tablet TAKE 1 TABLET BY MOUTH EVERY DAY 09/18/22   Chandrasekhar, Mahesh A, MD  fish oil-omega-3 fatty acids 1000 MG capsule Take 1 g by mouth daily.     [provider]  Garlic [id number] MG CAPS 1 capsule Orally once a day    [provider]  Homeopathic Products (LEG CRAMPS PO) Take 2 tablets by mouth 2 (two) times daily as needed (leg/hand cramps).    [provider]  ibuprofen (ADVIL) 800 MG tablet Take 800 mg by mouth every 6 (six) hours as needed. 10/23/21   [provider]  levothyroxine (SYNTHROID) 75 MCG tablet Take 1 tablet (75 mcg total) by mouth daily. 07/04/22   Elayne Snare, MD  lisinopril (ZESTRIL) 5 MG tablet TAKE 1 TABLET BY MOUTH EVERY DAY 10/23/22   Elayne Snare, MD  metoprolol tartrate (LOPRESSOR) 100 MG tablet 1 tablet with food Orally Twice a day    [provider]  Multiple Vitamins-Minerals (PRESERVISION AREDS) CAPS 2 capsules Orally  once a day    [provider]  nitroGLYCERIN (NITROSTAT) 0.4 MG SL tablet Place 1 tablet (0.4 mg total) under the tongue every 5 (five) minutes as needed for chest pain (may take up to 3 tablets). 12/15/21   Chandrasekhar, Mahesh A, MD  pravastatin (PRAVACHOL) 20 MG tablet TAKE 1 TABLET BY MOUTH EVERY DAY IN THE EVENING 09/18/22   Chandrasekhar, Mahesh A, MD  Probiotic Product (PROBIOTIC DAILY) CAPS Take 1 capsule by mouth daily.    [provider]  sodium fluoride (SODIUM FLUORIDE 5000 PLUS) 1.1 % CREA dental cream as directed  Dental daily    [provider]  valACYclovir (VALTREX) 500 MG tablet Take 500 mg by mouth daily. 07/30/22   [provider]  VITAMIN E PO Take 1 tablet by mouth daily. Unsure of dose    [provider]                                                                                                                                    Past Surgical History Past Surgical History:  Procedure Laterality Date   BREAST BIOPSY Right 08/11/1996   BREAST LUMPECTOMY Right 1998   CESAREAN SECTION     CHOLECYSTECTOMY     SPINE SURGERY     Family History Family History  Problem Relation Age of Onset   Heart disease Father    Pulmonary fibrosis Father    Hypertension Father    Pancreatic cancer Sister    Diabetes Maternal Aunt    Sleep apnea Son    Breast cancer Neg Hx     Social History Social History   Tobacco Use   Smoking status: Every Day    Packs/day: 0.30    Years: 40.00    Total pack years: 12.00    Types: Cigarettes   Smokeless tobacco: Never   Tobacco comments:    trying to quit. Pt states she is smoking about 5 cigarettes a day  Substance Use Topics   Alcohol use: Yes    Alcohol/week: 0.0 standard drinks of alcohol    Comment: seldom   Drug use: No   Allergies Hydrocodone-acetaminophen, Carbamide peroxide, Erythromycin base, and Lipitor [atorvastatin]  Review of Systems Review of Systems  Neurological:  Positive for syncope.    Physical Exam Vital Signs  I have reviewed the triage vital signs BP (!) 105/42 (BP Location: Right Arm)   Pulse 88   Temp 97.8 F (36.6 C) (Oral)   Resp 18   SpO2 99%   Physical Exam Vitals and nursing note reviewed.  Constitutional:      General: She is not in acute distress.    Appearance: She is well-developed.  HENT:     Head: Normocephalic and atraumatic.  Eyes:     Conjunctiva/sclera: Conjunctivae normal.  Cardiovascular:     Rate and Rhythm: Normal rate and regular rhythm.     Heart sounds: No  murmur heard. Pulmonary:     Effort: Pulmonary effort is normal. No respiratory distress.     Breath sounds: Normal breath sounds.  Abdominal:     Palpations: Abdomen is soft.     Tenderness: There is no abdominal tenderness.  Musculoskeletal:        General: No swelling.     Cervical back: Neck supple.  Skin:    General: Skin is warm and dry.     Capillary Refill: Capillary refill takes less than 2 seconds.  Neurological:     Mental Status: She is alert.  Psychiatric:        Mood and Affect: Mood normal.     ED Results and Treatments Labs (all labs ordered are listed, but only abnormal results are displayed) Labs Reviewed  CBG MONITORING, ED - Abnormal; Notable for the following components:      Result Value   Glucose-Capillary 104 (*)    All other components within normal limits  COMPREHENSIVE METABOLIC PANEL  CBC WITH DIFFERENTIAL/PLATELET  TROPONIN I (HIGH SENSITIVITY)                                                                                                                          Radiology No results found.  Pertinent labs & imaging results that were available during my care of the patient were reviewed by me and considered in my medical decision making (see MDM for details).  Medications Ordered in ED Medications  lactated ringers bolus 1,000 mL (has no administration in time range)                                                                                                                                     Procedures Procedures  (including critical care time)  Medical Decision Making / ED Course   This patient presents to the ED for concern of ***, this involves an extensive number of treatment options, and is a complaint that carries with it a high risk of complications and morbidity.  The differential diagnosis includes ***  MDM: ***   Additional history obtained: -Additional history obtained from *** -External records from outside  source obtained and reviewed including: Chart review including previous notes, labs, imaging, consultation notes   Lab Tests: -I ordered, reviewed, and interpreted labs.   The pertinent results include:   Labs Reviewed  CBG MONITORING, ED - Abnormal; Notable for  the following components:      Result Value   Glucose-Capillary 104 (*)    All other components within normal limits  COMPREHENSIVE METABOLIC PANEL  CBC WITH DIFFERENTIAL/PLATELET  TROPONIN I (HIGH SENSITIVITY)      EKG ***  EKG Interpretation  Date/Time:    Ventricular Rate:    PR Interval:    QRS Duration:   QT Interval:    QTC Calculation:   R Axis:     Text Interpretation:           Imaging Studies ordered: I ordered imaging studies including *** I independently visualized and interpreted imaging. I agree with the radiologist interpretation   Medicines ordered and prescription drug management: Meds ordered this encounter  Medications   lactated ringers bolus 1,000 mL    -I have reviewed the patients home medicines and have made adjustments as needed  Critical interventions ***  Consultations Obtained: I requested consultation with the ***,  and discussed lab and imaging findings as well as pertinent plan - they recommend: ***   Cardiac Monitoring: The patient was maintained on a cardiac monitor.  I personally viewed and interpreted the cardiac monitored which showed an underlying rhythm of: ***  Social Determinants of Health:  Factors impacting patients care include: ***   Reevaluation: After the interventions noted above, I reevaluated the patient and found that they have :{resolved/improved/worsened:23923::"improved"}  Co morbidities that complicate the patient evaluation  Past Medical History:  Diagnosis Date   Breast cancer ([place]) 1997   Depression    GERD (gastroesophageal reflux disease)    Graves disease    Hemorrhoids    Hypertension    Migraines    Osteoporosis     Personal history of radiation therapy 1998   Vitamin D deficiency       Dispostion: I considered admission for this patient, ***     Final Clinical Impression(s) / ED Diagnoses Final diagnoses:  None     '@PCDICTATION'$ @

## 2022-10-30 ENCOUNTER — Ambulatory Visit
Admission: EM | Admit: 2022-10-30 | Discharge: 2022-10-30 | Disposition: A | Discharge status: Home / Self Care | Payer: Medicare HMO | Attending: Nurse Practitioner | Admitting: Nurse Practitioner

## 2022-10-30 DIAGNOSIS — R300 Dysuria: Secondary | ICD-10-CM | POA: Insufficient documentation

## 2022-10-30 LAB — POCT URINALYSIS DIP (MANUAL ENTRY)
Bilirubin, UA: NEGATIVE
Glucose, UA: NEGATIVE mg/dL
Leukocytes, UA: NEGATIVE
Nitrite, UA: NEGATIVE
Protein Ur, POC: NEGATIVE mg/dL
Spec Grav, UA: 1.015 (ref 1.010–1.025)
Urobilinogen, UA: 0.2 E.U./dL
pH, UA: 5.5 (ref 5.0–8.0)

## 2022-10-30 MED ORDER — PHENAZOPYRIDINE HCL 100 MG PO TABS: 100.0000 mg | ORAL_TABLET | Freq: Three times a day (TID) | ORAL | 0 refills | Status: DC | PRN

## 2022-10-30 NOTE — ED Provider Notes (Signed)
[facility]    CSN: [id number] Arrival date & time: 10/30/22  1113      History   Chief Complaint No chief complaint on file.   HPI Victoria Blair is a 77 y.o. female.   Patient presents today with niece for 1 week of burning occasionally when she uses the bathroom, increased urinary frequency, decreased appetite, and perineal itching.  No urinary urgency, new urinary incontinence, foul urinary odor, hematuria, abdominal pain, new back pain, suprapubic pain or pressure, fever, nausea/vomiting.  No vaginal discharge.  Reports yesterday while out at the store, patient had a syncopal episode episode.  She was seen in the emergency room and has a follow-up scheduled with her primary care provider for later this week.    Past Medical History:  Diagnosis Date   Breast cancer ([place]) 1997   Depression    GERD (gastroesophageal reflux disease)    Graves disease    Hemorrhoids    Hypertension    Migraines    Osteoporosis    Personal history of radiation therapy 1998   Vitamin D deficiency     Patient Active Problem List   Diagnosis Date Noted   Osteoporosis, post-menopausal 09/12/2022   Myalgia due to statin 06/28/2022   Near syncope 11/23/2021   Aortic atherosclerosis ([place]) 11/23/2021   Coronary artery disease of native artery of native heart with stable angina pectoris ([place]) 11/23/2021   Lung nodule 12/31/2013   Tobacco abuse 12/31/2013   Unspecified essential hypertension 09/03/2013   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      Home Medications    Prior to Admission medications    Medication Sig Start Date End Date Taking? Authorizing Provider  phenazopyridine (PYRIDIUM) 100 MG tablet Take 1 tablet (100 mg total) by mouth 3 (three) times daily as needed for pain. 10/30/22  Yes Eulogio Bear, NP  Ascorbic Acid (VITAMIN C) 500 MG CAPS 1 tablet Orally Once a day    [provider]  aspirin EC 81 MG tablet Take 1 tablet (81 mg total) by mouth daily. Swallow whole. 11/23/21   Werner Lean, MD  Brewers Yeast 487.5 MG TABS two tablets Orally once a day    [provider]  Calcium Carbonate-Vitamin D (CALCIUM + D PO) Take 1 tablet by mouth daily.     [provider]  ezetimibe (ZETIA) 10 MG tablet TAKE 1 TABLET BY MOUTH EVERY DAY 09/18/22   Chandrasekhar, Mahesh A, MD  fish oil-omega-3 fatty acids 1000 MG capsule Take 1 g by mouth daily.     [provider]  Garlic [id number] MG CAPS 1 capsule Orally once a day    [provider]  Homeopathic Products (LEG CRAMPS PO) Take 2 tablets by mouth 2 (two) times daily as needed (leg/hand cramps).    [provider]  ibuprofen (ADVIL) 800 MG tablet Take 800 mg by mouth every 6 (six) hours as needed. 10/23/21   [provider]  levothyroxine (SYNTHROID) 75 MCG tablet Take 1 tablet (75 mcg total) by mouth daily. 07/04/22   Elayne Snare,  MD  lisinopril (ZESTRIL) 5 MG tablet TAKE 1 TABLET BY MOUTH EVERY DAY 10/23/22   Elayne Snare, MD  metoprolol tartrate (LOPRESSOR) 100 MG tablet 1 tablet with food Orally Twice a day    [provider]  Multiple Vitamins-Minerals (PRESERVISION AREDS) CAPS 2 capsules Orally once a day    [provider]  nitroGLYCERIN (NITROSTAT) 0.4 MG SL tablet Place 1 tablet (0.4 mg total) under the tongue every 5 (five) minutes as needed for chest pain (may take up to 3 tablets). 12/15/21   Chandrasekhar, Mahesh A, MD  pravastatin (PRAVACHOL) 20 MG tablet TAKE 1 TABLET BY MOUTH EVERY DAY IN THE EVENING 09/18/22   Chandrasekhar, Mahesh A, MD   Probiotic Product (PROBIOTIC DAILY) CAPS Take 1 capsule by mouth daily.    [provider]  sodium fluoride (SODIUM FLUORIDE 5000 PLUS) 1.1 % CREA dental cream as directed Dental daily    [provider]  valACYclovir (VALTREX) 500 MG tablet Take 500 mg by mouth daily. 07/30/22   [provider]  VITAMIN E PO Take 1 tablet by mouth daily. Unsure of dose    [provider]    Family History Family History  Problem Relation Age of Onset   Heart disease Father    Pulmonary fibrosis Father    Hypertension Father    Pancreatic cancer Sister    Diabetes Maternal Aunt    Sleep apnea Son    Breast cancer Neg Hx     Social History Social History   Tobacco Use   Smoking status: Every Day    Packs/day: 0.30    Years: 40.00    Total pack years: 12.00    Types: Cigarettes   Smokeless tobacco: Never   Tobacco comments:    trying to quit. Pt states she is smoking about 5 cigarettes a day  Substance Use Topics   Alcohol use: Yes    Alcohol/week: 0.0 standard drinks of alcohol    Comment: seldom   Drug use: No     Allergies   Hydrocodone-acetaminophen, Carbamide peroxide, Erythromycin base, and Lipitor [atorvastatin]   Review of Systems Review of Systems Per HPI  Physical Exam Triage Vital Signs ED Triage Vitals  Enc Vitals Group     BP 10/30/22 1137 (!) 140/65     Pulse Rate 10/30/22 1137 77     Resp 10/30/22 1137 20     Temp 10/30/22 1137 97.9 F (36.6 C)     Temp Source 10/30/22 1137 Oral     SpO2 10/30/22 1137 96 %     Weight --      Height --      Head Circumference --      Peak Flow --      Pain Score 10/30/22 1141 0     Pain Loc --      Pain Edu? --      Excl. in [place]? --    No data found.  Updated Vital Signs BP (!) 140/65 (BP Location: Right Arm)   Pulse 77   Temp 97.9 F (36.6 C) (Oral)   Resp 20   SpO2 96%   Visual Acuity Right Eye Distance:   Left Eye Distance:   Bilateral Distance:    Right Eye Near:    Left Eye Near:    Bilateral Near:     Physical Exam Vitals and nursing note reviewed.  Constitutional:      General: She is not in acute distress.    Appearance:  She is not toxic-appearing.  Cardiovascular:     Rate and Rhythm: Normal rate and regular rhythm.  Pulmonary:     Effort: Pulmonary effort is normal. No respiratory distress.  Abdominal:     General: Abdomen is flat. Bowel sounds are normal. There is no distension.     Palpations: Abdomen is soft. There is no mass.     Tenderness: There is no abdominal tenderness. There is no right CVA tenderness, left CVA tenderness or guarding.  Skin:    General: Skin is warm and dry.     Coloration: Skin is not jaundiced or pale.     Findings: No erythema.  Neurological:     Mental Status: She is alert and oriented to person, place, and time.     Motor: No weakness.     Gait: Gait normal.  Psychiatric:        Behavior: Behavior is cooperative.      UC Treatments / Results  Labs (all labs ordered are listed, but only abnormal results are displayed) Labs Reviewed  POCT URINALYSIS DIP (MANUAL ENTRY) - Abnormal; Notable for the following components:      Result Value   Ketones, POC UA small (15) (*)    Blood, UA small (*)    All other components within normal limits  URINE CULTURE    EKG   Radiology No results found.  Procedures Procedures (including critical care time)  Medications Ordered in UC Medications - No data to display  Initial Impression / Assessment and Plan / UC Course  I have reviewed the triage vital signs and the nursing notes.  Pertinent labs & imaging results that were available during my care of the patient were reviewed by me and considered in my medical decision making (see chart for details).   Patient is well-appearing, normotensive, afebrile, not tachycardic, not tachypneic, oxygenating well on room air.    1. Dysuria Urine sample today shows small amount of blood, otherwise no signs of  UTI Will send out for urine culture given dysuria In meantime, treat with Pyridium as needed Encouraged increasing water intake, eating meals high in protein Follow-up with primary care provider as planned, sooner in ER if symptoms worsen  The patient was given the opportunity to ask questions.  All questions answered to their satisfaction.  The patient is in agreement to this plan.    Final Clinical Impressions(s) / UC Diagnoses   Final diagnoses:  Dysuria     Discharge Instructions      As we discussed, the urine sample today shows a little bit of blood, otherwise no significant abnormalities.  We are sending for a urine culture to check for UTI and will call you later this week if it shows infection.  In the meantime, start taking the pyridium temporarily to help with the bladder symptoms.  Make sure you are drinking plenty of water and trying to eat meals high in protein.    ED Prescriptions     Medication Sig Dispense Auth. Provider   phenazopyridine (PYRIDIUM) 100 MG tablet Take 1 tablet (100 mg total) by mouth 3 (three) times daily as needed for pain. 10 tablet Eulogio Bear, NP      PDMP not reviewed this encounter.   Eulogio Bear, NP 10/30/22 1743

## 2022-10-30 NOTE — Discharge Instructions (Addendum)
As we discussed, the urine sample today shows a little bit of blood, otherwise no significant abnormalities.  We are sending for a urine culture to check for UTI and will call you later this week if it shows infection.  In the meantime, start taking the pyridium temporarily to help with the bladder symptoms.  Make sure you are drinking plenty of water and trying to eat meals high in protein.

## 2022-10-30 NOTE — ED Triage Notes (Signed)
Pt reports burning in her vagina, frequent urination, and confusion x 1 week.

## 2022-10-31 LAB — URINE CULTURE: Culture: <10000 — AB

## 2022-11-01 DIAGNOSIS — R82998 Other abnormal findings in urine: Secondary | ICD-10-CM | POA: Diagnosis not present

## 2022-11-01 DIAGNOSIS — R300 Dysuria: Secondary | ICD-10-CM | POA: Diagnosis not present

## 2022-11-01 DIAGNOSIS — I2510 Atherosclerotic heart disease of native coronary artery without angina pectoris: Secondary | ICD-10-CM | POA: Diagnosis not present

## 2022-11-01 DIAGNOSIS — R55 Syncope and collapse: Secondary | ICD-10-CM | POA: Diagnosis not present

## 2022-11-01 DIAGNOSIS — I10 Essential (primary) hypertension: Secondary | ICD-10-CM | POA: Diagnosis not present

## 2022-11-01 DIAGNOSIS — I700 Atherosclerosis of aorta: Secondary | ICD-10-CM | POA: Diagnosis not present

## 2022-11-13 DIAGNOSIS — C50911 Malignant neoplasm of unspecified site of right female breast: Secondary | ICD-10-CM | POA: Diagnosis not present

## 2022-11-20 DIAGNOSIS — C50911 Malignant neoplasm of unspecified site of right female breast: Secondary | ICD-10-CM | POA: Diagnosis not present

## 2022-11-24 ENCOUNTER — Other Ambulatory Visit: Payer: Self-pay

## 2022-11-24 ENCOUNTER — Emergency Department (HOSPITAL_COMMUNITY)
Admission: EM | Admit: 2022-11-24 | Discharge: 2022-11-24 | Disposition: A | Discharge status: Home / Self Care | Payer: Medicare HMO | Attending: Student | Admitting: Student

## 2022-11-24 DIAGNOSIS — R42 Dizziness and giddiness: Secondary | ICD-10-CM | POA: Diagnosis not present

## 2022-11-24 DIAGNOSIS — Z743 Need for continuous supervision: Secondary | ICD-10-CM | POA: Diagnosis not present

## 2022-11-24 DIAGNOSIS — I10 Essential (primary) hypertension: Secondary | ICD-10-CM | POA: Diagnosis not present

## 2022-11-24 DIAGNOSIS — R55 Syncope and collapse: Secondary | ICD-10-CM | POA: Diagnosis not present

## 2022-11-24 DIAGNOSIS — Z7982 Long term (current) use of aspirin: Secondary | ICD-10-CM | POA: Diagnosis not present

## 2022-11-24 DIAGNOSIS — E039 Hypothyroidism, unspecified: Secondary | ICD-10-CM | POA: Insufficient documentation

## 2022-11-24 DIAGNOSIS — Z79899 Other long term (current) drug therapy: Secondary | ICD-10-CM | POA: Diagnosis not present

## 2022-11-24 DIAGNOSIS — R69 Illness, unspecified: Secondary | ICD-10-CM | POA: Diagnosis not present

## 2022-11-24 DIAGNOSIS — F17210 Nicotine dependence, cigarettes, uncomplicated: Secondary | ICD-10-CM | POA: Diagnosis not present

## 2022-11-24 DIAGNOSIS — R231 Pallor: Secondary | ICD-10-CM | POA: Diagnosis not present

## 2022-11-24 DIAGNOSIS — I25119 Atherosclerotic heart disease of native coronary artery with unspecified angina pectoris: Secondary | ICD-10-CM | POA: Diagnosis not present

## 2022-11-24 DIAGNOSIS — I959 Hypotension, unspecified: Secondary | ICD-10-CM | POA: Diagnosis not present

## 2022-11-24 DIAGNOSIS — R110 Nausea: Secondary | ICD-10-CM | POA: Diagnosis not present

## 2022-11-24 DIAGNOSIS — Z853 Personal history of malignant neoplasm of breast: Secondary | ICD-10-CM | POA: Diagnosis not present

## 2022-11-24 LAB — HEPATIC FUNCTION PANEL
ALT: 22 U/L (ref 0–44)
AST: 27 U/L (ref 15–41)
Albumin: 3.5 g/dL (ref 3.5–5.0)
Alkaline Phosphatase: 37 U/L — ABNORMAL LOW (ref 38–126)
Bilirubin, Direct: 0.1 mg/dL (ref 0.0–0.2)
Indirect Bilirubin: 0.5 mg/dL (ref 0.3–0.9)
Total Bilirubin: 0.6 mg/dL (ref 0.3–1.2)
Total Protein: 6.1 g/dL — ABNORMAL LOW (ref 6.5–8.1)

## 2022-11-24 LAB — URINALYSIS, ROUTINE W REFLEX MICROSCOPIC
Bilirubin Urine: NEGATIVE
Glucose, UA: NEGATIVE mg/dL
Hgb urine dipstick: NEGATIVE
Ketones, ur: 5 mg/dL — AB
Nitrite: NEGATIVE
Protein, ur: 30 mg/dL — AB
Specific Gravity, Urine: 1.01 (ref 1.005–1.030)
pH: 7 (ref 5.0–8.0)

## 2022-11-24 LAB — CBC
HCT: 31.8 % — ABNORMAL LOW (ref 36.0–46.0)
Hemoglobin: 10.2 g/dL — ABNORMAL LOW (ref 12.0–15.0)
MCH: 31 pg (ref 26.0–34.0)
MCHC: 32.1 g/dL (ref 30.0–36.0)
MCV: 96.7 fL (ref 80.0–100.0)
Platelets: 191 10*3/uL (ref 150–400)
RBC: 3.29 MIL/uL — ABNORMAL LOW (ref 3.87–5.11)
RDW: 13.9 % (ref 11.5–15.5)
WBC: 11.6 10*3/uL — ABNORMAL HIGH (ref 4.0–10.5)
nRBC: 0 % (ref 0.0–0.2)

## 2022-11-24 LAB — BASIC METABOLIC PANEL
Anion gap: 7 (ref 5–15)
BUN: 21 mg/dL (ref 8–23)
CO2: 25 mmol/L (ref 22–32)
Calcium: 8.1 mg/dL — ABNORMAL LOW (ref 8.9–10.3)
Chloride: 107 mmol/L (ref 98–111)
Creatinine, Ser: 1.03 mg/dL — ABNORMAL HIGH (ref 0.44–1.00)
GFR, Estimated: 57 mL/min — ABNORMAL LOW (ref >60)
Glucose, Bld: 96 mg/dL (ref 70–99)
Potassium: 3.2 mmol/L — ABNORMAL LOW (ref 3.5–5.1)
Sodium: 139 mmol/L (ref 135–145)

## 2022-11-24 LAB — CBG MONITORING, ED: Glucose-Capillary: 88 mg/dL (ref 70–99)

## 2022-11-24 LAB — TSH: TSH: 1.077 u[IU]/mL (ref 0.350–4.500)

## 2022-11-24 MED ORDER — POTASSIUM CHLORIDE CRYS ER 20 MEQ PO TBCR
40.0000 meq | EXTENDED_RELEASE_TABLET | Freq: Once | ORAL | Status: AC
  Administered 2022-11-24: 40 meq via ORAL
  Filled 2022-11-24: qty 2

## 2022-11-24 MED ORDER — MAGNESIUM OXIDE -MG SUPPLEMENT 400 (240 MG) MG PO TABS
800.0000 mg | ORAL_TABLET | Freq: Once | ORAL | Status: AC
  Administered 2022-11-24: 800 mg via ORAL
  Filled 2022-11-24: qty 2

## 2022-11-24 MED ORDER — LACTATED RINGERS IV BOLUS
1000.0000 mL | Freq: Once | INTRAVENOUS | Status: AC
  Administered 2022-11-24: 1000 mL via INTRAVENOUS

## 2022-11-24 MED ORDER — ALBUMIN HUMAN 25 % IV SOLN
25.0000 g | Freq: Once | INTRAVENOUS | Status: AC
  Administered 2022-11-24: 25 g via INTRAVENOUS
  Filled 2022-11-24: qty 100

## 2022-11-24 NOTE — ED Provider Notes (Signed)
[facility] Provider Note  CSN: [id number] Arrival date & time: 11/24/22 1343  Chief Complaint(s) Near Syncope  HPI Victoria Blair is a 77 y.o. female breast cancer, GERD, Graves' disease status post thyroid ablation with radioactive iodine, HTN, CAD, osteoporosis who presents emergency department for evaluation of a presyncopal episode.  Patient states that she was at the grocery store when she became suddenly weak and lightheaded and lowered self to the floor.  Patient found to be hypotensive in the 80s by EMS and received 800 cc of normal saline prior to arrival.  Here in the emergency room, patient remains hypotensive with systolics in the [id number] but is alert and oriented answering all questions appropriately.  No significant tachycardia.  Patient does state that she is taking both lisinopril and metoprolol but did not take them this morning.   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 Authorizing Provider   Ascorbic Acid (VITAMIN C) 500 MG CAPS 1 tablet Orally Once a day    [provider]  aspirin EC 81 MG tablet Take 1 tablet (81 mg total) by mouth daily. Swallow whole. 11/23/21   Werner Lean, MD  Brewers Yeast 487.5 MG TABS two tablets Orally once a day    [provider]  Calcium Carbonate-Vitamin D (CALCIUM + D PO) Take 1 tablet by mouth daily.     [provider]  ezetimibe (ZETIA) 10 MG tablet TAKE 1 TABLET BY MOUTH EVERY DAY 09/18/22   Chandrasekhar, Mahesh A, MD  fish oil-omega-3 fatty acids 1000 MG capsule Take 1 g by mouth daily.     [provider]  Garlic [id number] MG CAPS 1 capsule Orally once a day    [provider]  Homeopathic Products (LEG CRAMPS PO) Take 2 tablets by mouth 2 (two) times daily as needed (leg/hand cramps).    [provider]  ibuprofen (ADVIL) 800 MG tablet Take 800 mg by mouth every 6 (six) hours as needed. 10/23/21   [provider]  levothyroxine (SYNTHROID) 75 MCG tablet Take 1 tablet (75 mcg total) by mouth daily. 07/04/22   Elayne Snare, MD  lisinopril (ZESTRIL) 5 MG tablet TAKE 1 TABLET BY MOUTH EVERY DAY 10/23/22   Elayne Snare, MD  metoprolol tartrate (LOPRESSOR) 100 MG tablet 1 tablet with food Orally Twice a day    [provider]  Multiple Vitamins-Minerals (PRESERVISION AREDS) CAPS 2 capsules Orally once a day    [provider]  nitroGLYCERIN (NITROSTAT) 0.4 MG SL  tablet Place 1 tablet (0.4 mg total) under the tongue every 5 (five) minutes as needed for chest pain (may take up to 3 tablets). 12/15/21   Werner Lean, MD  phenazopyridine (PYRIDIUM) 100 MG tablet Take 1 tablet (100 mg total) by mouth 3 (three) times daily as needed for pain. 10/30/22   Eulogio Bear, NP  pravastatin (PRAVACHOL) 20 MG tablet TAKE 1 TABLET BY MOUTH EVERY DAY IN THE EVENING 09/18/22   Werner Lean, MD  Probiotic Product (PROBIOTIC DAILY) CAPS Take 1 capsule by mouth daily.     [provider]  sodium fluoride (SODIUM FLUORIDE 5000 PLUS) 1.1 % CREA dental cream as directed Dental daily    [provider]  valACYclovir (VALTREX) 500 MG tablet Take 500 mg by mouth daily. 07/30/22   [provider]  VITAMIN E PO Take 1 tablet by mouth daily. Unsure of dose    [provider]                                                                                                                                    Past Surgical History Past Surgical History:  Procedure Laterality Date   BREAST BIOPSY Right 08/11/1996   BREAST LUMPECTOMY Right 1998   CESAREAN SECTION     CHOLECYSTECTOMY     SPINE SURGERY     Family History Family History  Problem Relation Age of Onset   Heart disease Father    Pulmonary fibrosis Father    Hypertension Father    Pancreatic cancer Sister    Diabetes Maternal Aunt    Sleep apnea Son    Breast cancer Neg Hx     Social History Social History   Tobacco Use   Smoking status: Every Day    Packs/day: 0.30    Years: 40.00    Additional pack years: 0.00    Total pack years: 12.00    Types: Cigarettes   Smokeless tobacco: Never   Tobacco comments:    trying to quit. Pt states she is smoking about 5 cigarettes a day  Substance Use Topics   Alcohol use: Yes    Alcohol/week: 0.0 standard drinks of alcohol    Comment: seldom   Drug use: No   Allergies Hydrocodone-acetaminophen, Carbamide peroxide, Erythromycin base, and Lipitor [atorvastatin]  Review of Systems Review of Systems  Neurological:  Positive for syncope and light-headedness.    Physical Exam Vital Signs  I have reviewed the triage vital signs BP (!) 109/51 (BP Location: Right Arm)   Pulse 79   Temp 97.6 F (36.4 C) (Oral)   Resp 16   Ht 5\' 7"  (1.702 m)   Wt 55.3 kg   SpO2 99%   BMI 19.09 kg/m   Physical Exam Vitals and nursing note reviewed.  Constitutional:      General: She is not in acute distress.    Appearance:  She is  well-developed.  HENT:     Head: Normocephalic and atraumatic.  Eyes:     Conjunctiva/sclera: Conjunctivae normal.  Cardiovascular:     Rate and Rhythm: Normal rate and regular rhythm.     Heart sounds: No murmur heard. Pulmonary:     Effort: Pulmonary effort is normal. No respiratory distress.     Breath sounds: Normal breath sounds.  Abdominal:     Palpations: Abdomen is soft.     Tenderness: There is no abdominal tenderness.  Musculoskeletal:        General: No swelling.     Cervical back: Neck supple.  Skin:    General: Skin is warm and dry.     Capillary Refill: Capillary refill takes less than 2 seconds.  Neurological:     Mental Status: She is alert.  Psychiatric:        Mood and Affect: Mood normal.     ED Results and Treatments Labs (all labs ordered are listed, but only abnormal results are displayed) Labs Reviewed  BASIC METABOLIC PANEL  CBC  URINALYSIS, ROUTINE W REFLEX MICROSCOPIC  TSH  CBG MONITORING, ED                                                                                                                          Radiology No results found.  Pertinent labs & imaging results that were available during my care of the patient were reviewed by me and considered in my medical decision making (see MDM for details).  Medications Ordered in ED Medications  lactated ringers bolus 1,000 mL (has no administration in time range)  albumin human 25 % solution 25 g (has no administration in time range)                                                                                                                                     Procedures .Critical Care  Performed by: Teressa Lower, MD Authorized by: Teressa Lower, MD   Critical care provider statement:    Critical care time (minutes):  30   Critical care was necessary to treat or prevent imminent or life-threatening deterioration of the following conditions:  Circulatory failure    Critical care was time spent personally by me on the following activities:  Development of treatment plan with patient or surrogate, discussions with consultants, evaluation of patient's response to treatment, examination of patient, ordering  and review of laboratory studies, ordering and review of radiographic studies, ordering and performing treatments and interventions, pulse oximetry, re-evaluation of patient's condition and review of old charts   (including critical care time)  Medical Decision Making / ED Course   This patient presents to the ED for concern of ***, this involves an extensive number of treatment options, and is a complaint that carries with it a high risk of complications and morbidity.  The differential diagnosis includes ***  MDM: ***   Additional history obtained: -Additional history obtained from *** -External records from outside source obtained and reviewed including: Chart review including previous notes, labs, imaging, consultation notes   Lab Tests: -I ordered, reviewed, and interpreted labs.   The pertinent results include:   Labs Reviewed  BASIC METABOLIC PANEL  CBC  URINALYSIS, ROUTINE W REFLEX MICROSCOPIC  TSH  CBG MONITORING, ED      EKG ***  EKG Interpretation  Date/Time:    Ventricular Rate:    PR Interval:    QRS Duration:   QT Interval:    QTC Calculation:   R Axis:     Text Interpretation:           Imaging Studies ordered: I ordered imaging studies including *** I independently visualized and interpreted imaging. I agree with the radiologist interpretation   Medicines ordered and prescription drug management: Meds ordered this encounter  Medications   lactated ringers bolus 1,000 mL   albumin human 25 % solution 25 g    -I have reviewed the patients home medicines and have made adjustments as needed  Critical interventions ***  Consultations Obtained: I requested consultation with the ***,  and discussed lab  and imaging findings as well as pertinent plan - they recommend: ***   Cardiac Monitoring: The patient was maintained on a cardiac monitor.  I personally viewed and interpreted the cardiac monitored which showed an underlying rhythm of: ***  Social Determinants of Health:  Factors impacting patients care include: ***   Reevaluation: After the interventions noted above, I reevaluated the patient and found that they have :{resolved/improved/worsened:23923::"improved"}  Co morbidities that complicate the patient evaluation  Past Medical History:  Diagnosis Date   Breast cancer ([place]) 1997   Depression    GERD (gastroesophageal reflux disease)    Graves disease    Hemorrhoids    Hypertension    Migraines    Osteoporosis    Personal history of radiation therapy 1998   Vitamin D deficiency       Dispostion: I considered admission for this patient, ***     Final Clinical Impression(s) / ED Diagnoses Final diagnoses:  None     @PCDICTATION @

## 2022-11-24 NOTE — ED Triage Notes (Signed)
Pt BIBA from [facility]. Pt had near syncopal event, did not fall.   BP 86/50- given 800 NS ->BP 112/44  18 LAC   Aox4  HR: 84 Spo2: 98 CBG: 142

## 2022-11-30 DIAGNOSIS — J441 Chronic obstructive pulmonary disease with (acute) exacerbation: Secondary | ICD-10-CM | POA: Diagnosis not present

## 2022-11-30 DIAGNOSIS — I10 Essential (primary) hypertension: Secondary | ICD-10-CM | POA: Diagnosis not present

## 2022-11-30 DIAGNOSIS — J438 Other emphysema: Secondary | ICD-10-CM | POA: Diagnosis not present

## 2022-12-18 ENCOUNTER — Telehealth: Payer: Self-pay

## 2022-12-18 NOTE — Telephone Encounter (Signed)
Left v/m and my chart message sent.

## 2022-12-18 NOTE — Telephone Encounter (Signed)
Patient wants to know why she is taking the blood pressure medication because she doesn't have HBP.  Looks like medication was managed by cardiology but it was filled by you last time.

## 2022-12-25 ENCOUNTER — Other Ambulatory Visit: Payer: Self-pay

## 2022-12-25 ENCOUNTER — Ambulatory Visit (INDEPENDENT_AMBULATORY_CARE_PROVIDER_SITE_OTHER): Payer: Medicare HMO

## 2022-12-25 ENCOUNTER — Encounter: Payer: Self-pay | Admitting: Emergency Medicine

## 2022-12-25 ENCOUNTER — Ambulatory Visit
Admission: EM | Admit: 2022-12-25 | Discharge: 2022-12-25 | Disposition: A | Discharge status: Home / Self Care | Payer: Medicare HMO | Attending: Family Medicine | Admitting: Family Medicine

## 2022-12-25 DIAGNOSIS — M79645 Pain in left finger(s): Secondary | ICD-10-CM

## 2022-12-25 DIAGNOSIS — S6010XA Contusion of unspecified finger with damage to nail, initial encounter: Secondary | ICD-10-CM

## 2022-12-25 DIAGNOSIS — S6992XA Unspecified injury of left wrist, hand and finger(s), initial encounter: Secondary | ICD-10-CM | POA: Diagnosis not present

## 2022-12-25 NOTE — Discharge Instructions (Addendum)
If not allergic, you may use over the counter ibuprofen or acetaminophen as needed. ° °

## 2022-12-25 NOTE — ED Triage Notes (Addendum)
Pt reports got left index finger "stuck" in a car door yesterday. Pt reports pain to in tip and underneath nail bed ever since. Swelling noted to tip of finger as well.

## 2022-12-25 NOTE — Progress Notes (Unsigned)
Cardiology Office Note:    Date:  12/26/2022   ID:  Victoria Blair, DOB Mar 13, 1946, MRN [id number]  PCP:  Joycelyn Rua, MD   [facility] Providers Cardiologist:  Christell Constant, MD }    Referring MD: Joycelyn Rua, MD   Chief Complaint  Patient presents with   Loss of Consciousness   Follow-up    CAD     History of Present Illness:    Victoria Blair is a 77 y.o. female with a hx of HTN, tobacco abuse, history of breast cancer, depression, GERD, Graves disease.   Per chart review, patient established care with Dr. Izora Ribas in 10/2021 for evaluation of syncope. Echocardiogram on 12/11/21 showed EF 60-65%, no regional wall motion abnormalities, moderate asymmetric LVH, normal RV systolic function, normal pulmonary artery systolic pressure. Coronary CT on 12/12/21 showed a coronary calcium score of 1443 (99th percentile), moderate stenosis. Study was sent for [facility] which showed no significant stenosis. They were unable to model the RCA. Patient was started on ASA and zetia. Developed myalgias on lipitor.  Because the CT was unable to model the RCA, patient underwent nuclear stress test on 01/03/22 that was a normal, low risk study without evidence of ischemia.   Patient was last seen by cardiology on 06/28/22. At that time, patient was doing well from a cardiovascular standpoint, and denied chest pain, pressure, sob, DOE, palpitations, and syncope. She was maintained on ASA, zetia 10 mg, lisinopril 5 mg, and PRN nitro.   Patient was recently seen in the ED on 10/29/22 after she had a syncopal episode. She was brought in by EMS, and her BP with EMS showed systolics in the 80s. She was given 800 cc fluid in the field, and her BP had improved to 105/44 when she arrived in the ED. Patient admitted that she did not eat or drink very much before she passed out. hsTn normal. The ED determined that her presentation was most consistent with orthostatic syncope in the setting of  hypovolemia. She was discharged with instructions to increase her protein intake and hydration. She was again seen in the ED on 11/24/22 with near syncope. Again, EMS found her to be hypotensive with systolic BP in the 80s. She was treated with 2L fluids and 25 of albumin, and symptoms improved. She was discharged from the ED with instructions to hold her BP medications   Today, patient denies having syncope/near syncope since being seen in the ED. She has not been taking her lisinopril or metoprolol, and she thinks this has helped her symptoms. The first time she passed out, she was standing at a copier in [place] and she suddenly lost consciousness. She did feel weak prior to passing out, and felt like she had to lean against the copier to stay upright. The second time, she was waiting to check out a the grocery store when she felt weak and lightheaded. She felt like she was going to faint, so she lowered herself to the floor. She denies having palpitations or chest pain prior to passing out. Besides her syncope/near syncope, she does not have any other concerns today. She denies having recent shortness of breath, chest pain on exertion. No orthopnea, ankle edema, palpitations.    Past Medical History:  Diagnosis Date   Breast cancer (HCC) 1997   Depression    GERD (gastroesophageal reflux disease)    Graves disease    Hemorrhoids    Hypertension    Migraines    Osteoporosis  Personal history of radiation therapy 1998   Vitamin D deficiency     Past Surgical History:  Procedure Laterality Date   BREAST BIOPSY Right 08/11/1996   BREAST LUMPECTOMY Right 1998   CESAREAN SECTION     CHOLECYSTECTOMY     SPINE SURGERY      Current Medications: Current Meds  Medication Sig   Ascorbic Acid (VITAMIN C) 500 MG CAPS 1 tablet Orally Once a day   aspirin EC 81 MG tablet Take 1 tablet (81 mg total) by mouth daily. Swallow whole.   Brewers Yeast 487.5 MG TABS two tablets Orally once a day    Calcium Carbonate-Vitamin D (CALCIUM + D PO) Take 1 tablet by mouth daily.    ezetimibe (ZETIA) 10 MG tablet TAKE 1 TABLET BY MOUTH EVERY DAY   fish oil-omega-3 fatty acids 1000 MG capsule Take 1 g by mouth daily.    Garlic 500 MG CAPS 1 capsule Orally once a day   Homeopathic Products (LEG CRAMPS PO) Take 2 tablets by mouth 2 (two) times daily as needed (leg/hand cramps).   ibuprofen (ADVIL) 800 MG tablet Take 800 mg by mouth every 6 (six) hours as needed.   levothyroxine (SYNTHROID) 75 MCG tablet Take 1 tablet (75 mcg total) by mouth daily.   Multiple Vitamins-Minerals (PRESERVISION AREDS) CAPS 2 capsules Orally once a day   nitroGLYCERIN (NITROSTAT) 0.4 MG SL tablet Place 1 tablet (0.4 mg total) under the tongue every 5 (five) minutes as needed for chest pain (may take up to 3 tablets).   phenazopyridine (PYRIDIUM) 100 MG tablet Take 1 tablet (100 mg total) by mouth 3 (three) times daily as needed for pain.   pravastatin (PRAVACHOL) 20 MG tablet TAKE 1 TABLET BY MOUTH EVERY DAY IN THE EVENING   Probiotic Product (PROBIOTIC DAILY) CAPS Take 1 capsule by mouth daily.   sodium fluoride (SODIUM FLUORIDE 5000 PLUS) 1.1 % CREA dental cream as directed Dental daily   valACYclovir (VALTREX) 500 MG tablet Take 500 mg by mouth daily.   VITAMIN E PO Take 1 tablet by mouth daily. Unsure of dose   [DISCONTINUED] lisinopril (ZESTRIL) 5 MG tablet TAKE 1 TABLET BY MOUTH EVERY DAY     Allergies:   Erythromycin base, Hydrocodone-acetaminophen, Hydrocodone-acetaminophen, Carbamide peroxide, and Lipitor [atorvastatin]   Social History   Socioeconomic History   Marital status: Divorced    Spouse name: Not on file   Number of children: Not on file   Years of education: Not on file   Highest education level: Not on file  Occupational History   Not on file  Tobacco Use   Smoking status: Every Day    Packs/day: 0.30    Years: 40.00    Additional pack years: 0.00    Total pack years: 12.00    Types:  Cigarettes   Smokeless tobacco: Never   Tobacco comments:    trying to quit. Pt states she is smoking about 5 cigarettes a day  Substance and Sexual Activity   Alcohol use: Yes    Alcohol/week: 0.0 standard drinks of alcohol    Comment: seldom   Drug use: No   Sexual activity: Yes  Other Topics Concern   Not on file  Social History Narrative   ** Merged History Encounter **       Social Determinants of Health   Financial Resource Strain: Not on file  Food Insecurity: Not on file  Transportation Needs: Not on file  Physical Activity: Not on file  Stress: Not on file  Social Connections: Not on file     Family History: The patient's family history includes Diabetes in her maternal aunt; Heart disease in her father; Hypertension in her father; Pancreatic cancer in her sister; Pulmonary fibrosis in her father; Sleep apnea in her son. There is no history of Breast cancer.  ROS:   Please see the history of present illness.     All other systems reviewed and are negative.  EKGs/Labs/Other Studies Reviewed:    The following studies were reviewed today: Cardiac Studies & Procedures     STRESS TESTS  MYOCARDIAL PERFUSION IMAGING 01/03/2022  Narrative   The study is normal. The study is low risk.   No ST deviation was noted.   LV perfusion is normal. There is no evidence of ischemia. There is no evidence of infarction.   Left ventricular function is normal. Nuclear stress EF: 67 %. The left ventricular ejection fraction is hyperdynamic (>65%). End diastolic cavity size is normal. End systolic cavity size is normal.   ECHOCARDIOGRAM  ECHOCARDIOGRAM COMPLETE 12/11/2021  Narrative ECHOCARDIOGRAM REPORT    Patient Name:   YANELIE ABRAHA Date of Exam: 12/11/2021 Medical Rec #:  [id number]          Height:       67.0 in Accession #:    [id number]         Weight:       115.0 lb Date of Birth:  05-12-1946           BSA:          1.598 m Patient Age:    75 years           BP:            120/62 mmHg Patient Gender: F                  HR:           81 bpm. Exam Location:  [address]  Procedure: 2D Echo, Cardiac Doppler and Color Doppler  Indications:     R55 Near Syncope  History:         Patient has no prior history of Echocardiogram examinations. Risk Factors:Hypertension. History of breast cancer.  Sonographer:     Daphine Deutscher RDCS Referring Phys:  [id number] [facility] Richardean Chimera Diagnosing Phys: Epifanio Lesches MD  IMPRESSIONS   1. Left ventricular ejection fraction, by estimation, is 60 to 65%. The left ventricle has normal function. The left ventricle has no regional wall motion abnormalities. There is moderate asymmetric left ventricular hypertrophy of the basal-septal segment. Left ventricular diastolic function could not be evaluated. 2. Right ventricular systolic function is normal. The right ventricular size is normal. There is normal pulmonary artery systolic pressure. The estimated right ventricular systolic pressure is 20.8 mmHg. 3. The mitral valve is normal in structure. No evidence of mitral valve regurgitation. 4. The aortic valve is tricuspid. Aortic valve regurgitation is mild. No aortic stenosis is present. 5. The inferior vena cava is normal in size with greater than 50% respiratory variability, suggesting right atrial pressure of 3 mmHg.  FINDINGS Left Ventricle: Left ventricular ejection fraction, by estimation, is 60 to 65%. The left ventricle has normal function. The left ventricle has no regional wall motion abnormalities. The left ventricular internal cavity size was small. There is moderate asymmetric left ventricular hypertrophy of the basal-septal segment. Left ventricular diastolic function could not be evaluated.  Right Ventricle: The right ventricular size  is normal. No increase in right ventricular wall thickness. Right ventricular systolic function is normal. There is normal pulmonary artery systolic pressure.  The tricuspid regurgitant velocity is 2.11 m/s, and with an assumed right atrial pressure of 3 mmHg, the estimated right ventricular systolic pressure is 20.8 mmHg.  Left Atrium: Left atrial size was normal in size.  Right Atrium: Right atrial size was normal in size.  Pericardium: Trivial pericardial effusion is present.  Mitral Valve: The mitral valve is normal in structure. No evidence of mitral valve regurgitation.  Tricuspid Valve: The tricuspid valve is normal in structure. Tricuspid valve regurgitation is mild.  Aortic Valve: The aortic valve is tricuspid. Aortic valve regurgitation is mild. Aortic regurgitation PHT measures 383 msec. No aortic stenosis is present.  Pulmonic Valve: The pulmonic valve was not well visualized. Pulmonic valve regurgitation is not visualized.  Aorta: The aortic root and ascending aorta are structurally normal, with no evidence of dilitation.  Venous: The inferior vena cava is normal in size with greater than 50% respiratory variability, suggesting right atrial pressure of 3 mmHg.  IAS/Shunts: The interatrial septum was not well visualized.   LEFT VENTRICLE PLAX 2D LVIDd:         3.30 cm LVIDs:         2.10 cm LV PW:         1.20 cm LV IVS:        1.20 cm LVOT diam:     1.80 cm LV SV:         52 LV SV Index:   32 LVOT Area:     2.54 cm   RIGHT VENTRICLE RV Basal diam:  3.50 cm RV S prime:     14.15 cm/s TAPSE (M-mode): 1.9 cm  LEFT ATRIUM             Index        RIGHT ATRIUM           Index LA diam:        3.10 cm 1.94 cm/m   RA Area:     13.00 cm LA Vol (A2C):   48.1 ml 30.09 ml/m  RA Volume:   33.40 ml  20.90 ml/m LA Vol (A4C):   33.4 ml 20.90 ml/m LA Biplane Vol: 42.6 ml 26.65 ml/m AORTIC VALVE LVOT Vmax:   99.35 cm/s LVOT Vmean:  67.750 cm/s LVOT VTI:    0.202 m AI PHT:      383 msec  AORTA Ao Root diam: 3.40 cm Ao Asc diam:  3.20 cm  MITRAL VALVE                TRICUSPID VALVE MV Area (PHT): 3.42 cm     TR Peak  grad:   17.8 mmHg MV Decel Time: 222 msec     TR Vmax:        211.00 cm/s MV E velocity: 75.20 cm/s MV A velocity: 101.65 cm/s  SHUNTS MV E/A ratio:  0.74         Systemic VTI:  0.20 m Systemic Diam: 1.80 cm  Epifanio Lesches MD Electronically signed by Epifanio Lesches MD Signature Date/Time: 12/11/2021/3:41:23 PM    Final (Updated)     CT SCANS  CT CORONARY MORPH W/CTA COR W/SCORE 12/12/2021  Addendum 12/12/2021  3:06 PM ADDENDUM REPORT: 12/12/2021 15:04  CLINICAL DATA:  This is a 77 year old female with anginal symptoms.  EXAM: Cardiac/Coronary  CTA  TECHNIQUE: The patient was scanned on a [employer]  Force scanner.  FINDINGS: A 100 kV prospective scan was triggered in the descending thoracic aorta at 111 HU's. Axial non-contrast 3 mm slices were carried out through the heart. The data set was analyzed on a dedicated work station and scored using the Agatson method. Gantry rotation speed was 250 msecs and collimation was .6 mm. No beta blockade and 0.8 mg of sl NTG was given. The 3D data set was reconstructed in 5% intervals of the 67-82 % of the R-R cycle. Diastolic phases were analyzed on a dedicated work station using MPR, MIP and VRT modes. The patient received 80 cc of contrast.  Image Quality: Fair, with misregistration artifact.  Aorta: Normal size.  No calcifications.  No dissection.  Aortic Valve:  Trileaflet.  No calcifications.  Coronary Arteries:  Normal coronary origin.  Right dominance.  RCA is a large dominant artery that gives rise to PDA and PLA. The RCA is diffusely calcified. There is a long (27 mm) calcified plaque which starts as a mild (25-49%) in the ostium of the RCA and terminates as a moderate (50-69%) plaque. The mid RCA with a moderate calcified plaque in the mid RCA. Due to multiple stair-step artifact the distal RCA is not well visualized- there is calcified plaque present that can not be accurately quantified.  Left main is  a large artery that gives rise to LAD and LCX arteries.  LAD is a large vessel. The proximal LAD with mild calcified lesions. The mid LAD with 2 separate plaques: There is a focal mild soft plaque and right below this is a long 14.4 mm moderate calcified plaque. The distal LAD with minimal luminal irregularities. D1 with mild calcified plaque in the proximal portion of the vessel.  LCX is a non-dominant artery that gives rise to one large OM1 branch. There is moderate mixed plaque in the proximal LCX. The mid and distal LCX with no plaques.  Coronary Calcium Score:  Left main: 0  Left anterior descending artery: 540  Left circumflex artery: 49  Right coronary artery: 854  Total: 1443  Percentile: 99  Other findings:  Normal pulmonary vein drainage into the left atrium.  Normal left atrial appendage without a thrombus.  Normal size of the pulmonary artery.  Mild mitral annular calcification  IMPRESSION: 1. Coronary calcium score of 1443. This was 70 percentile for age and sex matched control.  2. Normal coronary origin with right dominance.  3. CAD-RADS 3. Moderate stenosis. Consider symptom-guided anti-ischemic pharmacotherapy as well as risk factor modification per guideline directed care. Additional analysis with CT FFR will be submitted.  The noncardiac portion of this study will be interpreted in separate report by the radiologist.   Electronically Signed By: Thomasene Ripple D.O. On: 12/12/2021 15:04  Narrative EXAM: OVER-READ INTERPRETATION  CT CHEST  The following report is an over-read performed by radiologist Dr. Allegra Lai of [facility] on 12/12/2021. This over-read does not include interpretation of cardiac or coronary anatomy or pathology. The coronary calcium score/coronary CTA interpretation by the cardiologist is attached.  COMPARISON:  Multiple priors, most recent chest CT dated January 25, 2017  FINDINGS: Vascular: Normal  heart size. Normal caliber thoracic aorta with moderate atherosclerotic disease. No suspicious filling defects of the visualized pulmonary arteries.  Mediastinum/Nodes: Esophagus is unremarkable. No pathologically enlarged lymph nodes seen in the chest.  Lungs/Pleura: Central airways are patent. Right lower lobe patchy consolidations. Left lower lobe nodule measuring 5 mm on series 11, image 30, unchanged size of  compared with priors dating back to 2015, favored to be benign. Centrilobular emphysema. No evidence of pleural effusion or pneumothorax.  Upper Abdomen: No acute abnormality.  Musculoskeletal: No chest wall mass or suspicious bone lesions identified.  IMPRESSION: 1. Patchy right lower lobe consolidations, likely due to infection or aspiration. Recommend follow-up chest CT in 3 months to ensure resolution. 2. Aortic Atherosclerosis (ICD10-I70.0) and Emphysema (ICD10-J43.9).  Electronically Signed: By: Allegra Lai M.D. On: 12/12/2021 12:14           EKG:  EKG is not ordered today.  The EKG from 3/30 showed normal sinus rhythm, HR 83 BPM.   Recent Labs: 11/24/2022: ALT 22; BUN 21; Creatinine, Ser 1.03; Hemoglobin 10.2; Platelets 191; Potassium 3.2; Sodium 139; TSH 1.077  Recent Lipid Panel    Component Value Date/Time   CHOL 108 03/27/2022 0743   TRIG 66 03/27/2022 0743   HDL 39 (L) 03/27/2022 0743   CHOLHDL 2.8 03/27/2022 0743   CHOLHDL 4 12/28/2019 0820   VLDL 18.0 12/28/2019 0820   LDLCALC 55 03/27/2022 0743     Risk Assessment/Calculations:                Physical Exam:    VS:  BP 130/60   Pulse 62   Ht 5' 7.5" (1.715 m)   Wt 124 lb (56.2 kg)   SpO2 97%   BMI 19.13 kg/m     Wt Readings from Last 3 Encounters:  12/26/22 124 lb (56.2 kg)  11/24/22 121 lb 14.6 oz (55.3 kg)  10/29/22 122 lb (55.3 kg)     GEN: Elderly female, sitting comfortably on the exam table. Well nourished, well developed in no acute distress HEENT: Normal NECK:  No JVD  CARDIAC: RRR, no murmurs, rubs, gallops. Radial pulses 2+ bilaterally  RESPIRATORY:  Clear to auscultation without rales, wheezing or rhonchi, Normal WOB on room air  ABDOMEN: Soft, non-tender, non-distended MUSCULOSKELETAL:  No edema in BLE; No deformity  SKIN: Warm and dry NEUROLOGIC:  Alert and oriented x 3 PSYCHIATRIC:  Normal affect   ASSESSMENT:    1. Syncope and collapse   2. Near syncope   3. Hypotension, unspecified hypotension type   4. Coronary artery disease involving native coronary artery of native heart without angina pectoris   5. Dyslipidemia   6. Tobacco abuse   7. Hypokalemia   8. Mild aortic regurgitation    PLAN:    In order of problems listed above:  Syncope/Near Syncope Hypotension  - Patient has been seen in the ED twice recently for syncope/near syncope. On 3/4, she was standing at [employer] making copies, and on 3/30 she was walking in the grocery store when she suddenly felt weak and lightheaded. Both times, EMS found patient to be hypotensive in the field with SBP in the 80s. BP improved with IV fluids and she was instructed to stop taking BP medications  - Patient has not been taking lisinopril or metoprolol for the past month. Since stopping her BP medications, she has not had additional episodes of syncope/near syncope  - Orthostatic vital signs negative in office today  - Agree with stopping lisinopril and metoprolol  - Instructed patient to increase her PO hydration and to try to drink at least 60 ounces of fluid per day. Also instructed her to increase her protein intake and eat regularly throughout the day  - Ordered cardiac monitor to rule out arrhythmic causes of syncope  - Ordered echocardiogram  - Instructed patient that she  cannot drive for the next 6 months given recent episodes of syncope. Patient voiced understanding. She reports that her sister has been helping drive her around  - Ordered CBC, BMP. TSH was normal on 3/30   Moderate  nonobstructive CAD  - Coronary CT in 11/2021 showed moderate stenosis, FFR showed no significant stenosis  - Nuclear stress test in 12/2021 was a normal, low risk study without evidence of ischemia  - Dr. Izora Ribas has discussed LHC with patient multiple times. Plan to only proceed with LHC if patient has angina  - She denies chest pain or shortness of breath on exertion. No indication for LHC at this time   - Continue ASA 81 mg daily, pravastatin 20 mg daily, zetia 10 mg daily  HLD  - Lipid panel from 03/2022 showed LDL 55, HDL 39, triglycerides 66, total cholesterol 108  - Continue zetia 10 mg daily, pravastatin 20 mg daily  - Patient could not tolerate lipitor due to myalgias   Tobacco use  - Educated patient on tobacco cessation  - She reports cutting back on her cigarette use   Hypokalemia  - Most recent BMP from 11/24/22 showed K 3.2  - Ordered BMP today to recheck K   Mild AI  - Echocardiogram from 11/2021 showed mild aortic valve regurgitation  - Repeating echocardiogram as above   Medication Adjustments/Labs and Tests Ordered: Current medicines are reviewed at length with the patient today.  Concerns regarding medicines are outlined above.  Orders Placed This Encounter  Procedures   Basic metabolic panel   CBC   LONG TERM MONITOR (3-14 DAYS)   ECHOCARDIOGRAM COMPLETE   No orders of the defined types were placed in this encounter.   Patient Instructions  Medication Instructions:  Your physician has recommended you make the following change in your medication:   STOP Lisinopril  STOP Metoprolol   *If you need a refill on your cardiac medications before your next appointment, please call your pharmacy*   Lab Work: TODAY:  BMET & CBC  If you have labs (blood work) drawn today and your tests are completely normal, you will receive your results only by: MyChart Message (if you have MyChart) OR A paper copy in the mail If you have any lab test that is abnormal or  we need to change your treatment, we will call you to review the results.   Testing/Procedures: Christena Deem- Long Term Monitor Instructions  Your physician has requested you wear a ZIO patch monitor for 14 days.  This is a single patch monitor. [employer] supplies one patch monitor per enrollment. Additional stickers are not available. Please do not apply patch if you will be having a Nuclear Stress Test,  Echocardiogram, Cardiac CT, MRI, or Chest Xray during the period you would be wearing the  monitor. The patch cannot be worn during these tests. You cannot remove and re-apply the  ZIO XT patch monitor.  Your ZIO patch monitor will be mailed 3 day USPS to your address on file. It may take 3-5 days  to receive your monitor after you have been enrolled.  Once you have received your monitor, please review the enclosed instructions. Your monitor  has already been registered assigning a specific monitor serial # to you.  Billing and Patient Assistance Program Information  We have supplied [employer] with any of your insurance information on file for billing purposes. [employer] offers a sliding scale Patient Assistance Program for patients that do not have  insurance, or whose  insurance does not completely cover the cost of the ZIO monitor.  You must apply for the Patient Assistance Program to qualify for this discounted rate.  To apply, please call [employer] at [phone], select option 4, select option 2, ask to apply for  Patient Assistance Program. Meredeth Ide will ask your household income, and how many people  are in your household. They will quote your out-of-pocket cost based on that information.  [employer] will also be able to set up a 13-month, interest-free payment plan if needed.  Applying the monitor   Shave hair from upper left chest.  Hold abrader disc by orange tab. Rub abrader in 40 strokes over the upper left chest as  indicated in your monitor instructions.  Clean area with 4 enclosed  alcohol pads. Let dry.  Apply patch as indicated in monitor instructions. Patch will be placed under collarbone on left  side of chest with arrow pointing upward.  Rub patch adhesive wings for 2 minutes. Remove white label marked "1". Remove the white  label marked "2". Rub patch adhesive wings for 2 additional minutes.  While looking in a mirror, press and release button in center of patch. A small green light will  flash 3-4 times. This will be your only indicator that the monitor has been turned on.  Do not shower for the first 24 hours. You may shower after the first 24 hours.  Press the button if you feel a symptom. You will hear a small click. Record Date, Time and  Symptom in the Patient Logbook.  When you are ready to remove the patch, follow instructions on the last 2 pages of Patient  Logbook. Stick patch monitor onto the last page of Patient Logbook.  Place Patient Logbook in the blue and white box. Use locking tab on box and tape box closed  securely. The blue and white box has prepaid postage on it. Please place it in the mailbox as  soon as possible. Your physician should have your test results approximately 7 days after the  monitor has been mailed back to [facility].  Call [facility] Customer Care at [phone] if you have questions regarding  your ZIO XT patch monitor. Call them immediately if you see an orange light blinking on your  monitor.  If your monitor falls off in less than 4 days, contact our Monitor department at [phone].  If your monitor becomes loose or falls off after 4 days call [employer] at [phone] for  suggestions on securing your monitor   Your physician has requested that you have an echocardiogram. Echocardiography is a painless test that uses sound waves to create images of your heart. It provides your doctor with information about the size and shape of your heart and how well your heart's chambers and valves are working. This  procedure takes approximately one hour. There are no restrictions for this procedure. Please do NOT wear cologne, perfume, aftershave, or lotions (deodorant is allowed). Please arrive 15 minutes prior to your appointment time.   Follow-Up: At [facility], you and your health needs are our priority.  As part of our continuing mission to provide you with exceptional heart care, we have created designated Provider Care Teams.  These Care Teams include your primary Cardiologist (physician) and Advanced Practice Providers (APPs -  Physician Assistants and Nurse Practitioners) who all work together to provide you with the care you need, when you need it.  We recommend signing up for the patient portal called "MyChart".  Sign up information is provided on this After Visit Summary.  MyChart is used to connect with patients for Virtual Visits (Telemedicine).  Patients are able to view lab/test results, encounter notes, upcoming appointments, etc.  Non-urgent messages can be sent to your provider as well.   To learn more about what you can do with MyChart, go to [email or web address].    Your next appointment:   6 week(s)  Provider:   Christell Constant, MD  or Tereso Newcomer, PA-C         Other Instructions Increase your Protein intake Increase your water intake, at least 60 ounces a day  YOU CANNOT DRIVE FOR 6 MONTHS   Signed, Jonita Albee, PA-C  12/26/2022 11:11 AM    [facility]

## 2022-12-26 ENCOUNTER — Encounter: Payer: Self-pay | Admitting: Cardiology

## 2022-12-26 ENCOUNTER — Ambulatory Visit (INDEPENDENT_AMBULATORY_CARE_PROVIDER_SITE_OTHER): Payer: Medicare HMO

## 2022-12-26 ENCOUNTER — Ambulatory Visit: Payer: Medicare HMO | Attending: Physician Assistant | Admitting: Cardiology

## 2022-12-26 VITALS — BP 130/60 | HR 62 | Ht 67.5 in | Wt 124.0 lb

## 2022-12-26 DIAGNOSIS — R55 Syncope and collapse: Secondary | ICD-10-CM

## 2022-12-26 DIAGNOSIS — I351 Nonrheumatic aortic (valve) insufficiency: Secondary | ICD-10-CM | POA: Diagnosis not present

## 2022-12-26 DIAGNOSIS — Z720 Tobacco use: Secondary | ICD-10-CM

## 2022-12-26 DIAGNOSIS — I2510 Atherosclerotic heart disease of native coronary artery without angina pectoris: Secondary | ICD-10-CM

## 2022-12-26 DIAGNOSIS — E785 Hyperlipidemia, unspecified: Secondary | ICD-10-CM

## 2022-12-26 DIAGNOSIS — E876 Hypokalemia: Secondary | ICD-10-CM | POA: Diagnosis not present

## 2022-12-26 DIAGNOSIS — I959 Hypotension, unspecified: Secondary | ICD-10-CM | POA: Diagnosis not present

## 2022-12-26 NOTE — Patient Instructions (Addendum)
Medication Instructions:  Your physician has recommended you make the following change in your medication:   STOP Lisinopril  STOP Metoprolol   *If you need a refill on your cardiac medications before your next appointment, please call your pharmacy*   Lab Work: TODAY:  BMET & CBC  If you have labs (blood work) drawn today and your tests are completely normal, you will receive your results only by: MyChart Message (if you have MyChart) OR A paper copy in the mail If you have any lab test that is abnormal or we need to change your treatment, we will call you to review the results.   Testing/Procedures: Victoria Blair- Long Term Monitor Instructions  Your physician has requested you wear a ZIO patch monitor for 14 days.  This is a single patch monitor. [employer] supplies one patch monitor per enrollment. Additional stickers are not available. Please do not apply patch if you will be having a Nuclear Stress Test,  Echocardiogram, Cardiac CT, MRI, or Chest Xray during the period you would be wearing the  monitor. The patch cannot be worn during these tests. You cannot remove and re-apply the  ZIO XT patch monitor.  Your ZIO patch monitor will be mailed 3 day USPS to your address on file. It may take 3-5 days  to receive your monitor after you have been enrolled.  Once you have received your monitor, please review the enclosed instructions. Your monitor  has already been registered assigning a specific monitor serial # to you.  Billing and Patient Assistance Program Information  We have supplied [employer] with any of your insurance information on file for billing purposes. [employer] offers a sliding scale Patient Assistance Program for patients that do not have  insurance, or whose insurance does not completely cover the cost of the ZIO monitor.  You must apply for the Patient Assistance Program to qualify for this discounted rate.  To apply, please call [employer] at [phone], select option 4,  select option 2, ask to apply for  Patient Assistance Program. Meredeth Ide will ask your household income, and how many people  are in your household. They will quote your out-of-pocket cost based on that information.  [employer] will also be able to set up a 31-month, interest-free payment plan if needed.  Applying the monitor   Shave hair from upper left chest.  Hold abrader disc by orange tab. Rub abrader in 40 strokes over the upper left chest as  indicated in your monitor instructions.  Clean area with 4 enclosed alcohol pads. Let dry.  Apply patch as indicated in monitor instructions. Patch will be placed under collarbone on left  side of chest with arrow pointing upward.  Rub patch adhesive wings for 2 minutes. Remove white label marked "1". Remove the white  label marked "2". Rub patch adhesive wings for 2 additional minutes.  While looking in a mirror, press and release button in center of patch. A small green light will  flash 3-4 times. This will be your only indicator that the monitor has been turned on.  Do not shower for the first 24 hours. You may shower after the first 24 hours.  Press the button if you feel a symptom. You will hear a small click. Record Date, Time and  Symptom in the Patient Logbook.  When you are ready to remove the patch, follow instructions on the last 2 pages of Patient  Logbook. Stick patch monitor onto the last page of Patient Logbook.  Place Patient  Logbook in the blue and white box. Use locking tab on box and tape box closed  securely. The blue and white box has prepaid postage on it. Please place it in the mailbox as  soon as possible. Your physician should have your test results approximately 7 days after the  monitor has been mailed back to [facility].  Call [facility] Customer Care at [phone] if you have questions regarding  your ZIO XT patch monitor. Call them immediately if you see an orange light blinking on your  monitor.  If your  monitor falls off in less than 4 days, contact our Monitor department at [phone].  If your monitor becomes loose or falls off after 4 days call [employer] at [phone] for  suggestions on securing your monitor   Your physician has requested that you have an echocardiogram. Echocardiography is a painless test that uses sound waves to create images of your heart. It provides your doctor with information about the size and shape of your heart and how well your heart's chambers and valves are working. This procedure takes approximately one hour. There are no restrictions for this procedure. Please do NOT wear cologne, perfume, aftershave, or lotions (deodorant is allowed). Please arrive 15 minutes prior to your appointment time.   Follow-Up: At [facility], you and your health needs are our priority.  As part of our continuing mission to provide you with exceptional heart care, we have created designated Provider Care Teams.  These Care Teams include your primary Cardiologist (physician) and Advanced Practice Providers (APPs -  Physician Assistants and Nurse Practitioners) who all work together to provide you with the care you need, when you need it.  We recommend signing up for the patient portal called "MyChart".  Sign up information is provided on this After Visit Summary.  MyChart is used to connect with patients for Virtual Visits (Telemedicine).  Patients are able to view lab/test results, encounter notes, upcoming appointments, etc.  Non-urgent messages can be sent to your provider as well.   To learn more about what you can do with MyChart, go to [email or web address].    Your next appointment:   6 week(s)  Provider:   Christell Constant, MD  or Tereso Newcomer, PA-C         Other Instructions Increase your Protein intake Increase your water intake, at least 60 ounces a day  YOU CANNOT DRIVE FOR 6 MONTHS

## 2022-12-26 NOTE — ED Provider Notes (Signed)
[facility]   [id number] 12/25/22 Arrival Time: 1749  ASSESSMENT & PLAN:  1. Finger pain, left   2. Subungual hematoma of digit of hand, initial encounter    I have personally viewed and independently interpreted the imaging studies ordered this visit. No observed finger fracture of LEFT 2nd digit.  Procedure: Drainage of Subungual Hematoma  L 2nd finger cleaned with chlorhexidine. Declines digital block. R/B/I/A discussed. Complications may include loss of the nail, re-accumulation of hematoma, and infection. Verbal consent to proceed obtained. Single nail trephination successfully performed via cautery pen with drainage of dark red blood. Patient reports relief of pressure. Site bandaged. To keep digit clean and dry for 48 hours.   Intact distal sensation and normal capillary refill after procedure.  OTC analgesics as needed.  Reviewed expectations re: course of current medical issues. Questions answered. Outlined signs and symptoms indicating need for more acute intervention. Patient verbalized understanding. After Visit Summary given.   SUBJECTIVE:  Victoria Blair is a 77 y.o. female who presents with injury to LEFT 2nd finger; distal; "got caught in door yesterday". Still painful. Denies extremity sensation changes or weakness.    OBJECTIVE:  Vitals:   12/25/22 1848  BP: (!) 154/67  Pulse: 68  Resp: 20  Temp: 98.1 F (36.7 C)  TempSrc: Oral  SpO2: 98%    General appearance: alert; no distress LUE: distal 2nd finger is painful to touch; with subungual hematoma Psychological: alert and cooperative; normal mood and affect  Allergies  Allergen Reactions   Hydrocodone-Acetaminophen     Patient passed out with this medication   Carbamide Peroxide    Erythromycin Base Other (See Comments)    Other   Lipitor [Atorvastatin]     Myalgias    Past Medical History:  Diagnosis Date   Breast cancer (HCC) 1997   Depression    GERD (gastroesophageal  reflux disease)    Graves disease    Hemorrhoids    Hypertension    Migraines    Osteoporosis    Personal history of radiation therapy 1998   Vitamin D deficiency    Social History   Socioeconomic History   Marital status: Divorced    Spouse name: Not on file   Number of children: Not on file   Years of education: Not on file   Highest education level: Not on file  Occupational History   Not on file  Tobacco Use   Smoking status: Every Day    Packs/day: 0.30    Years: 40.00    Additional pack years: 0.00    Total pack years: 12.00    Types: Cigarettes   Smokeless tobacco: Never   Tobacco comments:    trying to quit. Pt states she is smoking about 5 cigarettes a day  Substance and Sexual Activity   Alcohol use: Yes    Alcohol/week: 0.0 standard drinks of alcohol    Comment: seldom   Drug use: No   Sexual activity: Yes  Other Topics Concern   Not on file  Social History Narrative   ** Merged History Encounter **       Social Determinants of Health   Financial Resource Strain: Not on file  Food Insecurity: Not on file  Transportation Needs: Not on file  Physical Activity: Not on file  Stress: Not on file  Social Connections: Not on file   Family History  Problem Relation Age of Onset   Heart disease Father    Pulmonary fibrosis Father  Hypertension Father    Pancreatic cancer Sister    Diabetes Maternal Aunt    Sleep apnea Son    Breast cancer Neg Hx    Past Surgical History:  Procedure Laterality Date   BREAST BIOPSY Right 08/11/1996   BREAST LUMPECTOMY Right 1998   CESAREAN SECTION     CHOLECYSTECTOMY     SPINE SURGERY              Mardella Layman, MD 12/26/22 [id number]

## 2022-12-26 NOTE — Progress Notes (Unsigned)
Enrolled for [employer] to mail a ZIO XT long term holter monitor to the patients address on file.  Requested delivery 01/02/23.  Dr. Izora Ribas to read.

## 2022-12-27 LAB — BASIC METABOLIC PANEL
BUN/Creatinine Ratio: 24 (ref 12–28)
BUN: 23 mg/dL (ref 8–27)
CO2: 26 mmol/L (ref 20–29)
Calcium: 9.3 mg/dL (ref 8.7–10.3)
Chloride: 104 mmol/L (ref 96–106)
Creatinine, Ser: 0.97 mg/dL (ref 0.57–1.00)
Glucose: 94 mg/dL (ref 70–99)
Potassium: 4.2 mmol/L (ref 3.5–5.2)
Sodium: 142 mmol/L (ref 134–144)
eGFR: 61 mL/min/{1.73_m2} (ref >59)

## 2022-12-27 LAB — CBC
Hematocrit: 35.1 % (ref 34.0–46.6)
Hemoglobin: 11.4 g/dL (ref 11.1–15.9)
MCH: 30.8 pg (ref 26.6–33.0)
MCHC: 32.5 g/dL (ref 31.5–35.7)
MCV: 95 fL (ref 79–97)
Platelets: 245 10*3/uL (ref 150–450)
RBC: 3.7 x10E6/uL — ABNORMAL LOW (ref 3.77–5.28)
RDW: 12.3 % (ref 11.7–15.4)
WBC: 7.6 10*3/uL (ref 3.4–10.8)

## 2022-12-28 DIAGNOSIS — R55 Syncope and collapse: Secondary | ICD-10-CM | POA: Diagnosis not present

## 2023-01-01 DIAGNOSIS — H04123 Dry eye syndrome of bilateral lacrimal glands: Secondary | ICD-10-CM | POA: Diagnosis not present

## 2023-01-01 DIAGNOSIS — H43812 Vitreous degeneration, left eye: Secondary | ICD-10-CM | POA: Diagnosis not present

## 2023-01-21 DIAGNOSIS — R55 Syncope and collapse: Secondary | ICD-10-CM | POA: Diagnosis not present

## 2023-01-23 ENCOUNTER — Ambulatory Visit (HOSPITAL_COMMUNITY): Payer: Medicare HMO | Attending: Cardiology

## 2023-01-23 DIAGNOSIS — R55 Syncope and collapse: Secondary | ICD-10-CM | POA: Diagnosis not present

## 2023-01-23 LAB — ECHOCARDIOGRAM COMPLETE
Area-P 1/2: 3.21 cm2
P 1/2 time: 323 msec
S' Lateral: 1.8 cm

## 2023-01-25 ENCOUNTER — Telehealth: Payer: Self-pay | Admitting: *Deleted

## 2023-01-25 NOTE — Telephone Encounter (Signed)
No answer/ No DPR 

## 2023-01-25 NOTE — Telephone Encounter (Signed)
-----   Message from April Henson sent at 01/18/2023  1:47 PM EDT ----- Contact: [phone] Patient is calling to get advice as to how to send monitor back. She states that the mail continue to be sent back to her. She would like to discuss this. Please advise.

## 2023-01-28 ENCOUNTER — Telehealth: Payer: Self-pay | Admitting: Internal Medicine

## 2023-01-28 NOTE — Telephone Encounter (Signed)
Returned call to patient and discussed echo results.  Per Robet Leu, PA-C: Please tell patient that her echocardiogram showed EF within normal limits at 65-70%. There is some thickening with the heart walls, which can occur in patients with hypertension. This is unchanged compared to echo from 11/2021. Her aortic valve is moderately leaky. We will need to repeat her echo in approx 1 year.   Patient verbalized understanding and expressed appreciation for call.

## 2023-01-28 NOTE — Telephone Encounter (Signed)
Patient returned RN's call. 

## 2023-01-29 ENCOUNTER — Telehealth: Payer: Self-pay | Admitting: Internal Medicine

## 2023-01-29 NOTE — Telephone Encounter (Signed)
-----   Message from Jonita Albee, [place] sent at 01/24/2023  1:39 PM EDT ----- Please tell patient that her cardiac monitor showed predominantly normal sinus rhythm with min HR 54 BPM, max HR 182 BPM, and average HR 80 BPM. There were frequent but brief episodes of SVT (fast HR) with the longest episode only lasing 13 seconds. She was not symptomatic during these episodes. Nothing on the monitor that would have explained her loss of consciousness.   Thanks! KJ

## 2023-01-29 NOTE — Telephone Encounter (Signed)
Patients states that she was returning call. Please advise

## 2023-01-29 NOTE — Telephone Encounter (Signed)
Left message for patient to call back  

## 2023-01-31 ENCOUNTER — Telehealth: Payer: Self-pay | Admitting: Internal Medicine

## 2023-01-31 NOTE — Telephone Encounter (Signed)
Returned call to patient and discussed heart monitor results.  Per Robet Leu, PA-C: Please tell patient that her cardiac monitor showed predominantly normal sinus rhythm with min HR 54 BPM, max HR 182 BPM, and average HR 80 BPM. There were frequent but brief episodes of SVT (fast HR) with the longest episode only lasing 13 seconds. She was not symptomatic during these episodes. Nothing on the monitor that would have explained her loss of consciousness.   Patient verbalized understanding and expressed appreciation for call.

## 2023-01-31 NOTE — Telephone Encounter (Signed)
Left the pt a message to call the office back to speak with a triage nurse about monitor results.

## 2023-01-31 NOTE — Telephone Encounter (Signed)
Patient is returning RN's call. Please advise. 

## 2023-01-31 NOTE — Telephone Encounter (Signed)
Patient calling to get her results. Please advise.

## 2023-02-01 NOTE — Telephone Encounter (Signed)
Pt aware of results per telephone note 01/31/23.

## 2023-02-08 ENCOUNTER — Encounter: Payer: Self-pay | Admitting: Pharmacist

## 2023-02-08 NOTE — Progress Notes (Signed)
[facility])     [facility] Quality Pharmacy Team Statin Quality Measure Assessment  02/08/2023  Victoria Blair Mar 24, 1946 [id number]  Per review of chart and payor information, patient has a diagnosis of cardiovascular disease but is not currently filling a statin prescription. This places patient into the [facility] (Statin Use In Patients with Cardiovascular Disease) measure for CMS.    Patient has documented trials of atorvastatin  with reported muscle pain, but no corresponding CPT codes that would exclude patient from [facility] measure.     Component Value Date/Time   CHOL 108 03/27/2022 0743   TRIG 66 03/27/2022 0743   HDL 39 (L) 03/27/2022 0743   CHOLHDL 2.8 03/27/2022 0743   CHOLHDL 4 12/28/2019 0820   VLDL 18.0 12/28/2019 0820   LDLCALC 55 03/27/2022 0743    Please consider the following recommendation:  Code for past statin intolerance  (required annually)  Provider Requirements: Must associate code during an office visit or telehealth encounter   Drug Induced Myopathy G72.0   Myalgia (SPC ONLY) M79.1   Myositis, unspecified M60.9   Myopathy, unspecified G72.9   Rhabdomyolysis M62.82   Thank you for allowing [facility] to be a part of this patient's care.   [name], PharmD [facility]  [facility] Clinical Pharmacist Direct Dial: [phone]

## 2023-02-10 NOTE — Progress Notes (Unsigned)
Cardiology Office Note:    Date:  02/11/2023  ID:  Victoria Blair, DOB 19-Jul-1946, MRN [id number] PCP: Joycelyn Rua, MD  [facility] Providers Cardiologist:  Christell Constant, MD       Patient Profile:      Coronary artery disease  Nonobstructive by CT - CCTA 12/12/21: CAD scor 1443 (99th percentile); oRCA 25-49, mRCA 50-69, LAD soft plaque, dist irregs, pLCx mod plaque - could not model RCA; o/w FFR normal Myoview 01/03/22: no ischemia, EF 67, low risk  Plan cath if +angina  Aortic insufficiency  TTE 01/23/23: EF 65-70, no RWMA, mod LVH, Gr 1 DD, NL RVSF, NL PASP, RVSP 29.4, mod AI, RAP 3 Hypertension  Hyperlipidemia  Intol to statins Tobacco use Breast CA s/p radiation Aortic atherosclerosis  Syncope  Monitor 01/24/23: avg HR 80, NSR, one run NSVT (6 beats), freq parox SVT (longest 13 sec) - asymptomatic, isolated PACs, PVCs      History of Present Illness:   Victoria Blair is a 77 y.o. female who returns for follow up of CAD, syncope. She was last seen in the office by Robet Leu, PA-C after a trip to the ED for syncope. She was hypotensive and tx with IVFs. Her Lisinopril and Metoprolol were DC'd. She had not had any further syncopal episodes at follow up and a monitor and echocardiogram were obtained. Her echocardiogram showed normal EF, mod AI. Her monitor did not show any significant arrhythmias. There were frequent Supraventricular Tachycardia episodes that were asymptomatic. She is here alone. She has not had any further syncope. She has not had chest pain, shortness of breath, edema.   ROS See HPI    Studies Reviewed:    EKG:  not done   Risk Assessment/Calculations:     HYPERTENSION CONTROL Vitals:   02/11/23 0937 02/11/23 1006  BP: (!) 140/50 (!) 160/70    The patient's blood pressure is elevated above target today.  In order to address the patient's elevated BP: Blood pressure will be monitored at home to determine if medication  changes need to be made.          Physical Exam:   VS:  BP (!) 160/70   Pulse 88   Ht 5\' 7"  (1.702 m)   Wt 123 lb 9.6 oz (56.1 kg)   SpO2 98%   BMI 19.36 kg/m    Wt Readings from Last 3 Encounters:  02/11/23 123 lb 9.6 oz (56.1 kg)  12/26/22 124 lb (56.2 kg)  11/24/22 121 lb 14.6 oz (55.3 kg)    Constitutional:      Appearance: Healthy appearance. Not in distress.  Neck:     Vascular: No JVR. JVD normal.  Pulmonary:     Breath sounds: Normal breath sounds. No rales.  Cardiovascular:     Normal rate. Regular rhythm.     Murmurs: There is no murmur.  Edema:    Peripheral edema absent.  Abdominal:     Palpations: Abdomen is soft.       ASSESSMENT AND PLAN:   Syncope and collapse No further syncope. She had documented low BP in the ED and has done well since stopping Metoprolol and Lisinopril. Her echocardiogram showed normal EF and her monitor did not show any bradyarrhythmias or pauses. No further testing needed. If she has recurrent syncope in the future that is unexplained, consider referral to EP for ILR.  Essential hypertension BP is running higher. She has a BP machine at home. Prior meds  included Metoprolol 100 mg twice daily, Lisinopril 5 mg once daily. These were stopped in March when she went to the ED with syncope and low BP. Monitor BP x 1 week and send via MyChart for review. I would recommend a higher target BP for her b/c of her hx of syncope and low BP (<140/90). If BP remains above target, consider resuming Lisinopril 5 mg once daily.   Coronary artery disease of native artery of native heart with stable angina pectoris (HCC) Nonobstructive CAD on CCTA in 11/2021. Myoview in 12/2021 was low risk. She is not having chest pain to suggest angina. Continue ASA 81 mg once daily, Pravastatin 20 mg once daily. Follow up in 6 mos.   Aortic atherosclerosis (HCC) Continue ASA, statin Rx.   Aortic insufficiency Mod AI on echocardiogram in 12/2022. Plan repeat  echocardiogram in 1 year for continued surveillance.      Dispo:  Return in about 6 months (around 08/13/2023) for Routine Follow Up w/ Dr. Izora Ribas, or Tereso Newcomer, PA-C.  Signed, Tereso Newcomer, PA-C

## 2023-02-11 ENCOUNTER — Encounter: Payer: Self-pay | Admitting: Physician Assistant

## 2023-02-11 ENCOUNTER — Ambulatory Visit: Payer: Medicare HMO | Attending: Physician Assistant | Admitting: Physician Assistant

## 2023-02-11 VITALS — BP 160/70 | HR 88 | Ht 67.0 in | Wt 123.6 lb

## 2023-02-11 DIAGNOSIS — I10 Essential (primary) hypertension: Secondary | ICD-10-CM

## 2023-02-11 DIAGNOSIS — I25118 Atherosclerotic heart disease of native coronary artery with other forms of angina pectoris: Secondary | ICD-10-CM

## 2023-02-11 DIAGNOSIS — I351 Nonrheumatic aortic (valve) insufficiency: Secondary | ICD-10-CM | POA: Diagnosis not present

## 2023-02-11 DIAGNOSIS — I700 Atherosclerosis of aorta: Secondary | ICD-10-CM | POA: Diagnosis not present

## 2023-02-11 DIAGNOSIS — R55 Syncope and collapse: Secondary | ICD-10-CM | POA: Diagnosis not present

## 2023-02-11 HISTORY — DX: Nonrheumatic aortic (valve) insufficiency: I35.1

## 2023-02-11 NOTE — Assessment & Plan Note (Signed)
Nonobstructive CAD on CCTA in 11/2021. Myoview in 12/2021 was low risk. She is not having chest pain to suggest angina. Continue ASA 81 mg once daily, Pravastatin 20 mg once daily. Follow up in 6 mos.

## 2023-02-11 NOTE — Assessment & Plan Note (Signed)
Continue ASA, statin Rx.  

## 2023-02-11 NOTE — Patient Instructions (Addendum)
Medication Instructions:  Your physician recommends that you continue on your current medications as directed. Please refer to the Current Medication list given to you today.  *If you need a refill on your cardiac medications before your next appointment, please call your pharmacy*   Lab Work: None ordered  If you have labs (blood work) drawn today and your tests are completely normal, you will receive your results only by: MyChart Message (if you have MyChart) OR A paper copy in the mail If you have any lab test that is abnormal or we need to change your treatment, we will call you to review the results.   Testing/Procedures: None ordered   Follow-Up: At [facility], you and your health needs are our priority.  As part of our continuing mission to provide you with exceptional heart care, we have created designated Provider Care Teams.  These Care Teams include your primary Cardiologist (physician) and Advanced Practice Providers (APPs -  Physician Assistants and Nurse Practitioners) who all work together to provide you with the care you need, when you need it.  We recommend signing up for the patient portal called "MyChart".  Sign up information is provided on this After Visit Summary.  MyChart is used to connect with patients for Virtual Visits (Telemedicine).  Patients are able to view lab/test results, encounter notes, upcoming appointments, etc.  Non-urgent messages can be sent to your provider as well.   To learn more about what you can do with MyChart, go to [email or web address].    Your next appointment:   6 month(s)  Provider:   Christell Constant, MD     Other Instructions Your physician has requested that you regularly monitor and record your blood pressure readings at home. Please use the same machine at the same time of day to check your readings and record them to bring to your follow-up visit.   Please monitor blood pressures and keep a log of your  readings for 1 week and you can either send them in via mychart or you can call them in.    Make sure to check 2 hours after your medications.    AVOID these things for 30 minutes before checking your blood pressure: No Drinking caffeine. No Drinking alcohol. No Eating. No Smoking. No Exercising.   Five minutes before checking your blood pressure: Pee. Sit in a dining chair. Avoid sitting in a soft couch or armchair. Be quiet. Do not talk   DASH Eating Plan DASH stands for Dietary Approaches to Stop Hypertension. The DASH eating plan is a healthy eating plan that has been shown to: Lower high blood pressure (hypertension). Reduce your risk for type 2 diabetes, heart disease, and stroke. Help with weight loss. What are tips for following this plan? Reading food labels Check food labels for the amount of salt (sodium) per serving. Choose foods with less than 5 percent of the Daily Value (DV) of sodium. In general, foods with less than 300 milligrams (mg) of sodium per serving fit into this eating plan. To find whole grains, look for the word "whole" as the first word in the ingredient list. Shopping Buy products labeled as "low-sodium" or "no salt added." Buy fresh foods. Avoid canned foods and pre-made or frozen meals. Cooking Try not to add salt when you cook. Use salt-free seasonings or herbs instead of table salt or sea salt. Check with your health care provider or pharmacist before using salt substitutes. Do not fry foods. Cook foods in  healthy ways, such as baking, boiling, grilling, roasting, or broiling. Cook using oils that are good for your heart. These include olive, canola, avocado, soybean, and sunflower oil. Meal planning  Eat a balanced diet. This should include: 4 or more servings of fruits and 4 or more servings of vegetables each day. Try to fill half of your plate with fruits and vegetables. 6-8 servings of whole grains each day. 6 or less servings of lean meat,  poultry, or fish each day. 1 oz is 1 serving. A 3 oz (85 g) serving of meat is about the same size as the palm of your hand. One egg is 1 oz (28 g). 2-3 servings of low-fat dairy each day. One serving is 1 cup (237 mL). 1 serving of nuts, seeds, or beans 5 times each week. 2-3 servings of heart-healthy fats. Healthy fats called omega-3 fatty acids are found in foods such as walnuts, flaxseeds, fortified milks, and eggs. These fats are also found in cold-water fish, such as sardines, salmon, and mackerel. Limit how much you eat of: Canned or prepackaged foods. Food that is high in trans fat, such as fried foods. Food that is high in saturated fat, such as fatty meat. Desserts and other sweets, sugary drinks, and other foods with added sugar. Full-fat dairy products. Do not salt foods before eating. Do not eat more than 4 egg yolks a week. Try to eat at least 2 vegetarian meals a week. Eat more home-cooked food and less restaurant, buffet, and fast food. Lifestyle When eating at a restaurant, ask if your food can be made with less salt or no salt. If you drink alcohol: Limit how much you have to: 0-1 drink a day if you are female. 0-2 drinks a day if you are female. Know how much alcohol is in your drink. In the [place] one drink is one 12 oz bottle of beer (355 mL), one 5 oz glass of wine (148 mL), or one 1 oz glass of hard liquor (44 mL). General information Avoid eating more than 2,300 mg of salt a day. If you have hypertension, you may need to reduce your sodium intake to 1,500 mg a day. Work with your provider to stay at a healthy body weight or lose weight. Ask what the best weight range is for you. On most days of the week, get at least 30 minutes of exercise that causes your heart to beat faster. This may include walking, swimming, or biking. Work with your provider or dietitian to adjust your eating plan to meet your specific calorie needs. What foods should I eat? Fruits All fresh,  dried, or frozen fruit. Canned fruits that are in their natural juice and do not have sugar added to them. Vegetables Fresh or frozen vegetables that are raw, steamed, roasted, or grilled. Low-sodium or reduced-sodium tomato and vegetable juice. Low-sodium or reduced-sodium tomato sauce and tomato paste. Low-sodium or reduced-sodium canned vegetables. Grains Whole-grain or whole-wheat bread. Whole-grain or whole-wheat pasta. Brown rice. Orpah Cobb. Bulgur. Whole-grain and low-sodium cereals. Pita bread. Low-fat, low-sodium crackers. Whole-wheat flour tortillas. Meats and other proteins Skinless chicken or [place]. Ground chicken or [place]. Pork with fat trimmed off. Fish and seafood. Egg whites. Dried beans, peas, or lentils. Unsalted nuts, nut butters, and seeds. Unsalted canned beans. Lean cuts of beef with fat trimmed off. Low-sodium, lean precooked or cured meat, such as sausages or meat loaves. Dairy Low-fat (1%) or fat-free (skim) milk. Reduced-fat, low-fat, or fat-free cheeses. Nonfat, low-sodium ricotta  or cottage cheese. Low-fat or nonfat yogurt. Low-fat, low-sodium cheese. Fats and oils Soft margarine without trans fats. Vegetable oil. Reduced-fat, low-fat, or light mayonnaise and salad dressings (reduced-sodium). Canola, safflower, olive, avocado, soybean, and sunflower oils. Avocado. Seasonings and condiments Herbs. Spices. Seasoning mixes without salt. Other foods Unsalted popcorn and pretzels. Fat-free sweets. The items listed above may not be all the foods and drinks you can have. Talk to a dietitian to learn more. What foods should I avoid? Fruits Canned fruit in a light or heavy syrup. Fried fruit. Fruit in cream or butter sauce. Vegetables Creamed or fried vegetables. Vegetables in a cheese sauce. Regular canned vegetables that are not marked as low-sodium or reduced-sodium. Regular canned tomato sauce and paste that are not marked as low-sodium or reduced-sodium. Regular  tomato and vegetable juices that are not marked as low-sodium or reduced-sodium. Rosita Fire. Olives. Grains Baked goods made with fat, such as croissants, muffins, or some breads. Dry pasta or rice meal packs. Meats and other proteins Fatty cuts of meat. Ribs. Fried meat. Tomasa Blase. Bologna, salami, and other precooked or cured meats, such as sausages or meat loaves, that are not lean and low in sodium. Fat from the back of a pig (fatback). Bratwurst. Salted nuts and seeds. Canned beans with added salt. Canned or smoked fish. Whole eggs or egg yolks. Chicken or [place] with skin. Dairy Whole or 2% milk, cream, and half-and-half. Whole or full-fat cream cheese. Whole-fat or sweetened yogurt. Full-fat cheese. Nondairy creamers. Whipped toppings. Processed cheese and cheese spreads. Fats and oils Butter. Stick margarine. Lard. Shortening. Ghee. Bacon fat. Tropical oils, such as coconut, palm kernel, or palm oil. Seasonings and condiments Onion salt, garlic salt, seasoned salt, table salt, and sea salt. Worcestershire sauce. Tartar sauce. Barbecue sauce. Teriyaki sauce. Soy sauce, including reduced-sodium soy sauce. Steak sauce. Canned and packaged gravies. Fish sauce. Oyster sauce. Cocktail sauce. Store-bought horseradish. Ketchup. Mustard. Meat flavorings and tenderizers. Bouillon cubes. Hot sauces. Pre-made or packaged marinades. Pre-made or packaged taco seasonings. Relishes. Regular salad dressings. Other foods Salted popcorn and pretzels. The items listed above may not be all the foods and drinks you should avoid. Talk to a dietitian to learn more. Where to find more information National Heart, Lung, and Blood Institute (NHLBI): BuffaloDryCleaner.gl American Heart Association (AHA): heart.org Academy of Nutrition and Dietetics: eatright.org National Kidney Foundation (NKF): kidney.org This information is not intended to replace advice given to you by your health care provider. Make sure you discuss any  questions you have with your health care provider. Document Revised: 08/30/2022 Document Reviewed: 08/30/2022 Elsevier Patient Education  2024 [employer].

## 2023-02-11 NOTE — Assessment & Plan Note (Signed)
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 Plan repeat echocardiogram in 1 year for continued surveillance.

## 2023-02-11 NOTE — Assessment & Plan Note (Signed)
BP is running higher. She has a BP machine at home. Prior meds included Metoprolol 100 mg twice daily, Lisinopril 5 mg once daily. These were stopped in March when she went to the ED with syncope and low BP. Monitor BP x 1 week and send via MyChart for review. I would recommend a higher target BP for her b/c of her hx of syncope and low BP (<140/90). If BP remains above target, consider resuming Lisinopril 5 mg once daily.

## 2023-02-11 NOTE — Assessment & Plan Note (Signed)
No further syncope. She had documented low BP in the ED and has done well since stopping Metoprolol and Lisinopril. Her echocardiogram showed normal EF and her monitor did not show any bradyarrhythmias or pauses. No further testing needed. If she has recurrent syncope in the future that is unexplained, consider referral to EP for ILR.

## 2023-02-21 ENCOUNTER — Other Ambulatory Visit: Payer: Self-pay | Admitting: Endocrinology

## 2023-03-11 ENCOUNTER — Other Ambulatory Visit (INDEPENDENT_AMBULATORY_CARE_PROVIDER_SITE_OTHER): Payer: Medicare HMO

## 2023-03-11 ENCOUNTER — Encounter: Payer: Self-pay | Admitting: Endocrinology

## 2023-03-11 DIAGNOSIS — E890 Postprocedural hypothyroidism: Secondary | ICD-10-CM | POA: Diagnosis not present

## 2023-03-11 DIAGNOSIS — I10 Essential (primary) hypertension: Secondary | ICD-10-CM

## 2023-03-11 LAB — BASIC METABOLIC PANEL
BUN: 16 mg/dL (ref 6–23)
CO2: 29 mEq/L (ref 19–32)
Calcium: 9.7 mg/dL (ref 8.4–10.5)
Chloride: 103 mEq/L (ref 96–112)
Creatinine, Ser: 1.03 mg/dL (ref 0.40–1.20)
GFR: 53.37 mL/min — ABNORMAL LOW (ref >60.00)
Glucose, Bld: 88 mg/dL (ref 70–99)
Potassium: 3.9 mEq/L (ref 3.5–5.1)
Sodium: 139 mEq/L (ref 135–145)

## 2023-03-11 LAB — TSH: TSH: 21.57 u[IU]/mL — ABNORMAL HIGH (ref 0.35–5.50)

## 2023-03-11 LAB — T4, FREE: Free T4: 0.87 ng/dL (ref 0.60–1.60)

## 2023-03-13 ENCOUNTER — Ambulatory Visit: Payer: Medicare HMO | Admitting: Endocrinology

## 2023-03-13 ENCOUNTER — Encounter: Payer: Self-pay | Admitting: Endocrinology

## 2023-03-13 VITALS — BP 138/70 | HR 67 | Ht 67.0 in | Wt 120.0 lb

## 2023-03-13 DIAGNOSIS — E890 Postprocedural hypothyroidism: Secondary | ICD-10-CM

## 2023-03-13 MED ORDER — LEVOTHYROXINE SODIUM 100 MCG PO TABS: 100.0000 ug | ORAL_TABLET | Freq: Every day | ORAL | 3 refills | Status: DC

## 2023-03-13 NOTE — Progress Notes (Signed)
Patient ID: Victoria Blair, female   DOB: 05-Sep-1945, 77 y.o.   MRN: [id number]    Reason for Appointment:  Follow-up visit for various problems   History of Present Illness:    OSTEOPOROSIS; T score at spine was -2.8 in October 2018 She was previously given Actonel but stopped this because of difficulty with compliance.   She previously had been taking Evista but did not take it regularly.  This was restarted on her visit in 05/2018 Subsequently stopped this because of cost  T score at the spine in 06/2019 was -3.1 and declined 4.6%  Results from 06/2021:   Lumbar spine L1-L4 Femoral neck (FN) 33% distal radius  T-score -2.7 RFN: -1.9 LFN: -2.6 n/a  Change in BMD from previous DXA test (%) Up 5.9% Down 5.3% n/a    In her youth she was 5 feet 7-1/2 inches tall  More recently has been treated with RECLAST:  She had the first infusion in 07/2019 without any side effect She had a second infusion of 5 g Reclast on 08/31/2020 Third infusion was on 09/12/2021 and the last infusion was on 09/18/2022 She has tolerated the infusions well  Vitamin D deficiency:  She has been taking 2000 Units for her vitamin D deficiency  Lab Results  Component Value Date   VD25OH 32.69 09/10/2022   VD25OH 30.65 09/04/2021   VD25OH 48.76 03/27/2021   VD25OH 30.62 07/25/2020     HYPOTHYROIDISM: This was first diagnosed in 1978 after [id number] treatment for Graves' disease          Her dosages have been adjusted periodically  Her dose was adjusted last in 8/22 when her TSH was relatively high She is taking 75 micrograms daily  Her dose has been reduced in 2023 No complaints of fatigue or lethargy recently, no change in cold intolerance However she is noticing more hair loss Weight is about the same or less  She is taking levothyroxine daily in the morning before breakfast regularly Not taking any vitamins or calcium with this  She has not missed any doses of levothyroxine  but her TSH is markedly increased at 21.6, previously normal in March  TSH history:  Lab Results  Component Value Date   TSH 21.57 (H) 03/11/2023   TSH 1.077 11/24/2022   TSH 1.57 09/10/2022   FREET4 0.87 03/11/2023   FREET4 1.24 09/10/2022   FREET4 1.35 04/10/2022   ' Wt Readings from Last 3 Encounters:  03/13/23 120 lb (54.4 kg)  02/11/23 123 lb 9.6 oz (56.1 kg)  12/26/22 124 lb (56.2 kg)     HYPERTENSION: She has been on treatment since 2009 and had previously been on Lotrel for quite some time with good control.   Blood pressure was low normal in 11/2017 and her Lotrel was stopped  Now taking lisinopril 5 mg daily along with metoprolol from cardiology    BP Readings from Last 3 Encounters:  03/13/23 138/70  02/11/23 (!) 160/70  12/26/22 130/60     Allergies as of 03/13/2023       Reactions   Erythromycin Base Other (See Comments), Anaphylaxis   Other Other, Other   Hydrocodone-acetaminophen    Patient passed out with this medication   Hydrocodone-acetaminophen Nausea And Vomiting   Patient passed out with this medication   Carbamide Peroxide    Lipitor [atorvastatin]    Myalgias        Medication List  Accurate as of March 13, 2023  8:42 AM. If you have any questions, ask your nurse or doctor.          aspirin EC 81 MG tablet Take 1 tablet (81 mg total) by mouth daily. Swallow whole.   Brewers Yeast 487.5 MG Tabs two tablets Orally once a day   CALCIUM + D PO Take 1 tablet by mouth daily.   ezetimibe 10 MG tablet Commonly known as: ZETIA TAKE 1 TABLET BY MOUTH EVERY DAY   fish oil-omega-3 fatty acids 1000 MG capsule Take 1 g by mouth daily.   Garlic 500 MG Caps 1 capsule Orally once a day   ibuprofen 800 MG tablet Commonly known as: ADVIL Take 800 mg by mouth every 6 (six) hours as needed.   LEG CRAMPS PO Take 2 tablets by mouth 2 (two) times daily as needed (leg/hand cramps).   levothyroxine 75 MCG tablet Commonly known  as: SYNTHROID Take 1 tablet (75 mcg total) by mouth daily.   nitroGLYCERIN 0.4 MG SL tablet Commonly known as: NITROSTAT Place 1 tablet (0.4 mg total) under the tongue every 5 (five) minutes as needed for chest pain (may take up to 3 tablets).   phenazopyridine 100 MG tablet Commonly known as: Pyridium Take 1 tablet (100 mg total) by mouth 3 (three) times daily as needed for pain.   pravastatin 20 MG tablet Commonly known as: PRAVACHOL TAKE 1 TABLET BY MOUTH EVERY DAY IN THE EVENING   PreserVision AREDS Caps 2 capsules Orally once a day   Probiotic Daily Caps Take 1 capsule by mouth daily.   Sodium Fluoride 5000 Plus 1.1 % Crea dental cream Generic drug: sodium fluoride as directed Dental daily   valACYclovir 500 MG tablet Commonly known as: VALTREX Take 500 mg by mouth daily.   Vitamin C 500 MG Caps 1 tablet Orally Once a day   VITAMIN E PO Take 1 tablet by mouth daily. Unsure of dose        Past Medical History:  Diagnosis Date   Aortic insufficiency 02/11/2023   TTE 01/23/23: EF 65-70, no RWMA, mod LVH, Gr 1 DD, NL RVSF, NL PASP, RVSP 29.4, mod AI, RAP 3   Breast cancer (HCC) 1997   Depression    GERD (gastroesophageal reflux disease)    Graves disease    Hemorrhoids    Hypertension    Migraines    Osteoporosis    Personal history of radiation therapy 1998   Vitamin D deficiency     Past Surgical History:  Procedure Laterality Date   BREAST BIOPSY Right 08/11/1996   BREAST LUMPECTOMY Right 1998   CESAREAN SECTION     CHOLECYSTECTOMY     SPINE SURGERY      Right breast cancer treated with lumpectomy and radiation 2000, lumbar spine surgery     Family History  Problem Relation Age of Onset   Heart disease Father    Pulmonary fibrosis Father    Hypertension Father    Pancreatic cancer Sister    Diabetes Maternal Aunt    Sleep apnea Son    Breast cancer Neg Hx     Social History:  reports that she has been smoking cigarettes. She has a 12  pack-year smoking history. She has never used smokeless tobacco. She reports current alcohol use. She reports that she does not use drugs.  Allergies:  Allergies  Allergen Reactions   Erythromycin Base Other (See Comments) and Anaphylaxis    Other  Other,  Other   Hydrocodone-Acetaminophen     Patient passed out with this medication   Hydrocodone-Acetaminophen Nausea And Vomiting    Patient passed out with this medication   Carbamide Peroxide    Lipitor [Atorvastatin]     Myalgias    REVIEW of systems:  Weight history:   Wt Readings from Last 3 Encounters:  03/13/23 120 lb (54.4 kg)  02/11/23 123 lb 9.6 oz (56.1 kg)  12/26/22 124 lb (56.2 kg)     Hyperlipidemia: She has had hypercholesterolemia This is managed by cardiology with pravastatin and Zetia   Lab Results  Component Value Date   CHOL 108 03/27/2022   CHOL 188 12/28/2019   CHOL 187 06/24/2019   Lab Results  Component Value Date   HDL 39 (L) 03/27/2022   HDL 46.60 12/28/2019   HDL 48.20 06/24/2019   Lab Results  Component Value Date   LDLCALC 55 03/27/2022   LDLCALC 124 (H) 12/28/2019   LDLCALC 122 (H) 06/24/2019   Lab Results  Component Value Date   TRIG 66 03/27/2022   TRIG 90.0 12/28/2019   TRIG 84.0 06/24/2019   Lab Results  Component Value Date   CHOLHDL 2.8 03/27/2022   CHOLHDL 4 12/28/2019   CHOLHDL 4 06/24/2019   No results found for: "LDLDIRECT"        Examination:   BP 138/70 (BP Location: Left Arm, Patient Position: Sitting, Cuff Size: Small)   Pulse 67   Ht 5\' 7"  (1.702 m)   Wt 120 lb (54.4 kg)   SpO2 98%   BMI 18.79 kg/m       Assessments / PLAN:   HYPOTHYROIDISM, post ablative, long-standing  Currently on 75 mcg since 5/23 TSH is unusually high over 20 although only symptoms appear to be hair loss She has not changed the way she takes her levothyroxine and is consistent with this  Will now increase the dose to 100 mcg and have her follow-up in about 2  months  For osteoporosis she will get another Reclast infusion in 08/2023   Reather Littler 03/13/2023, 8:42 AM

## 2023-03-20 DIAGNOSIS — R87810 Cervical high risk human papillomavirus (HPV) DNA test positive: Secondary | ICD-10-CM | POA: Diagnosis not present

## 2023-03-20 DIAGNOSIS — Z124 Encounter for screening for malignant neoplasm of cervix: Secondary | ICD-10-CM | POA: Diagnosis not present

## 2023-03-20 DIAGNOSIS — Z9189 Other specified personal risk factors, not elsewhere classified: Secondary | ICD-10-CM | POA: Diagnosis not present

## 2023-03-20 DIAGNOSIS — Z1151 Encounter for screening for human papillomavirus (HPV): Secondary | ICD-10-CM | POA: Diagnosis not present

## 2023-03-20 DIAGNOSIS — Z01419 Encounter for gynecological examination (general) (routine) without abnormal findings: Secondary | ICD-10-CM | POA: Diagnosis not present

## 2023-03-21 ENCOUNTER — Encounter: Payer: Self-pay | Admitting: Emergency Medicine

## 2023-03-21 ENCOUNTER — Ambulatory Visit
Admission: EM | Admit: 2023-03-21 | Discharge: 2023-03-21 | Disposition: A | Discharge status: Home / Self Care | Payer: Medicare HMO

## 2023-03-21 DIAGNOSIS — H9202 Otalgia, left ear: Secondary | ICD-10-CM

## 2023-03-21 NOTE — Discharge Instructions (Signed)
Please go to ENT if the pain persists Today your exam is normal

## 2023-03-21 NOTE — ED Triage Notes (Signed)
Left ear pain states ear feels stopped up x 1 month

## 2023-03-21 NOTE — ED Provider Notes (Signed)
[facility]    CSN: [id number] Arrival date & time: 03/21/23  1138      History   Chief Complaint No chief complaint on file.   HPI Victoria Blair is a 77 y.o. female who presents with L ear pain  x 1 month. Has surgery in this ear and just wants to make sure there is nothing more serious. Denies fever or URI or decreased hearing.     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   Myalgia due to statin 06/28/2022   Syncope and collapse 11/23/2021   Aortic atherosclerosis (HCC) 11/23/2021   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      Home Medications    Prior to Admission medications   Medication Sig Start Date End Date Taking? Authorizing Provider  Ascorbic Acid (VITAMIN C) 500 MG CAPS 1 tablet Orally Once a day    [provider]  aspirin EC 81 MG tablet Take 1 tablet (81 mg total) by mouth daily.  Swallow whole. 11/23/21   Christell Constant, MD  Brewers Yeast 487.5 MG TABS two tablets Orally once a day    [provider]  Calcium Carbonate-Vitamin D (CALCIUM + D PO) Take 1 tablet by mouth daily.     [provider]  ezetimibe (ZETIA) 10 MG tablet TAKE 1 TABLET BY MOUTH EVERY DAY 09/18/22   Chandrasekhar, Mahesh A, MD  fish oil-omega-3 fatty acids 1000 MG capsule Take 1 g by mouth daily.     [provider]  Garlic 500 MG CAPS 1 capsule Orally once a day    [provider]  Homeopathic Products (LEG CRAMPS PO) Take 2 tablets by mouth 2 (two) times daily as needed (leg/hand cramps).    [provider]  ibuprofen (ADVIL) 800 MG tablet Take 800 mg by mouth every 6 (six) hours as needed. 10/23/21   [provider]  levothyroxine (SYNTHROID) 100 MCG tablet Take 1 tablet (100 mcg total) by mouth daily. 03/13/23   Reather Littler, MD  Multiple Vitamins-Minerals (PRESERVISION AREDS) CAPS 2 capsules Orally once a day    [provider]  nitroGLYCERIN (NITROSTAT) 0.4 MG SL tablet Place 1 tablet (0.4 mg total) under the tongue every 5 (five) minutes as needed for chest pain (may take up to 3 tablets).  12/15/21   Christell Constant, MD  phenazopyridine (PYRIDIUM) 100 MG tablet Take 1 tablet (100 mg total) by mouth 3 (three) times daily as needed for pain. 10/30/22   Valentino Nose, NP  pravastatin (PRAVACHOL) 20 MG tablet TAKE 1 TABLET BY MOUTH EVERY DAY IN THE EVENING 09/18/22   Christell Constant, MD  Probiotic Product (PROBIOTIC DAILY) CAPS Take 1 capsule by mouth daily.    [provider]  sodium fluoride (SODIUM FLUORIDE 5000 PLUS) 1.1 % CREA dental cream as directed Dental daily    [provider]  valACYclovir (VALTREX) 500 MG tablet Take 500 mg by mouth daily. 07/30/22   [provider]  VITAMIN E PO Take 1 tablet by mouth daily. Unsure of dose    [provider]    Family History Family  History  Problem Relation Age of Onset   Heart disease Father    Pulmonary fibrosis Father    Hypertension Father    Pancreatic cancer Sister    Diabetes Maternal Aunt    Sleep apnea Son    Breast cancer Neg Hx     Social History Social History   Tobacco Use   Smoking status: Every Day    Current packs/day: 0.30    Average packs/day: 0.3 packs/day for 40.0 years (12.0 ttl pk-yrs)    Types: Cigarettes   Smokeless tobacco: Never   Tobacco comments:    trying to quit. Pt states she is smoking about 5 cigarettes a day  Substance Use Topics   Alcohol use: Yes    Alcohol/week: 0.0 standard drinks of alcohol    Comment: seldom   Drug use: No     Allergies   Erythromycin base, Hydrocodone-acetaminophen, Hydrocodone-acetaminophen, Carbamide peroxide, and Lipitor [atorvastatin]   Review of Systems Review of Systems As noted in HPI  Physical Exam Triage Vital Signs ED Triage Vitals  Encounter Vitals Group     BP 03/21/23 1144 (!) 149/81     Systolic BP Percentile --      Diastolic BP Percentile --      Pulse Rate 03/21/23 1144 84     Resp 03/21/23 1144 18     Temp 03/21/23 1144 97.6 F (36.4 C)     Temp Source 03/21/23 1144 Oral     SpO2 03/21/23 1144 96 %     Weight --      Height --      Head Circumference --      Peak Flow --      Pain Score 03/21/23 1146 5     Pain Loc --      Pain Education --      Exclude from Growth Chart --    No data found.  Updated Vital Signs BP (!) 149/81 (BP Location: Right Arm)   Pulse 84   Temp 97.6 F (36.4 C) (Oral)   Resp 18   SpO2 96%   Visual Acuity Right Eye Distance:   Left Eye Distance:   Bilateral Distance:    Right Eye Near:   Left Eye Near:    Bilateral Near:     Physical Exam Vitals and nursing note reviewed.  HENT:     Right Ear: Tympanic membrane, ear canal and external ear normal.     Left Ear: Tympanic membrane, ear canal and external ear normal.  Eyes:     General: No scleral icterus.     Conjunctiva/sclera: Conjunctivae normal.  Pulmonary:     Effort: Pulmonary effort  is normal.  Musculoskeletal:     Cervical back: Neck supple.  Skin:    General: Skin is warm and dry.  Neurological:     Mental Status: She is alert and oriented to person, place, and time.     Gait: Gait normal.  Psychiatric:        Mood and Affect: Mood normal.        Behavior: Behavior normal.        Thought Content: Thought content normal.        Judgment: Judgment normal.      UC Treatments / Results  Labs (all labs ordered are listed, but only abnormal results are displayed) Labs Reviewed - No data to display  EKG   Radiology No results found.  Procedures Procedures (including critical care time)  Medications Ordered in UC Medications - No data to display  Initial Impression / Assessment and Plan / UC Course  I have reviewed the triage vital signs and the nursing notes.  L Otalgia with no  disease found  Needs to FU with ENT if the pain persists.  Final Clinical Impressions(s) / UC Diagnoses   Final diagnoses:  Otalgia, left     Discharge Instructions      Please go to ENT if the pain persists Today your exam is normal     ED Prescriptions   None    PDMP not reviewed this encounter.   Garey Ham, [place] 03/21/23 1208

## 2023-03-24 ENCOUNTER — Ambulatory Visit
Admission: EM | Admit: 2023-03-24 | Discharge: 2023-03-24 | Disposition: A | Discharge status: Home / Self Care | Payer: Medicare HMO | Attending: Family Medicine | Admitting: Family Medicine

## 2023-03-24 ENCOUNTER — Other Ambulatory Visit: Payer: Self-pay

## 2023-03-24 ENCOUNTER — Encounter: Payer: Self-pay | Admitting: Emergency Medicine

## 2023-03-24 DIAGNOSIS — M7989 Other specified soft tissue disorders: Secondary | ICD-10-CM

## 2023-03-24 DIAGNOSIS — T63481A Toxic effect of venom of other arthropod, accidental (unintentional), initial encounter: Secondary | ICD-10-CM

## 2023-03-24 MED ORDER — EPINEPHRINE 0.3 MG/0.3ML IJ SOAJ: 0.3000 mg | INTRAMUSCULAR | 0 refills | Status: AC | PRN

## 2023-03-24 MED ORDER — TRIAMCINOLONE ACETONIDE 0.1 % EX CREA: 1.0000 | TOPICAL_CREAM | Freq: Two times a day (BID) | CUTANEOUS | 0 refills | Status: DC

## 2023-03-24 NOTE — ED Triage Notes (Signed)
Pt reports was stung by a yellow jacket on right index finger this am.

## 2023-03-24 NOTE — Discharge Instructions (Signed)
It does not appear that the sting is causing an overall reaction in your body at this time, however this of course can progress over time so keep a close eye out for symptoms such as a hives rash, throat itching or swelling, difficulty breathing, heart racing.  Taking antihistamine such as Zyrtec or Claritin twice daily, apply the triamcinolone cream to the swollen area of the sting and ice off-and-on.  If your symptoms start worsening at any time, you will have access to the EpiPen that was sent over but should also go immediately to the emergency department.

## 2023-03-25 ENCOUNTER — Telehealth: Payer: Self-pay | Admitting: Internal Medicine

## 2023-03-25 NOTE — Telephone Encounter (Signed)
Patient calling to see if she is able to start driving again. Please advise

## 2023-03-25 NOTE — Telephone Encounter (Signed)
Returned call to patient and informed her that she cannot drive for 6 months following her syncopal episode which happened 10/29/22. She will not be cleared to drive until 25/95/63.  Patient verbalized understanding and expressed appreciation for call.

## 2023-03-27 NOTE — ED Provider Notes (Signed)
[facility]    CSN: [id number] Arrival date & time: 03/24/23  1213      History   Chief Complaint Chief Complaint  Patient presents with   Insect Bite    HPI Victoria Blair is a 77 y.o. female.   Patient presenting today with a sting from a yellowjacket that occurred to the right index finger distally this morning.  She states the area is red, painful and slightly swollen.  Denies rashes elsewhere, throat itching or swelling, chest tightness, shortness of breath, nausea, vomiting.  So far taken Benadryl with no relief.  States she once had a multiple yellowjacket stings at the same time and had a reaction and was given an EpiPen at that time.  Has never had a similar reaction in the past since then.    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   Myalgia due to statin 06/28/2022   Syncope and collapse 11/23/2021   Aortic atherosclerosis (HCC) 11/23/2021   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      Home Medications    Prior to Admission medications   Medication Sig Start Date End Date Taking? Authorizing Provider  EPINEPHrine 0.3 mg/0.3 mL IJ SOAJ injection Inject 0.3 mg into the muscle as needed for anaphylaxis. 03/24/23  Yes Particia Nearing, PA-C  triamcinolone cream (KENALOG) 0.1 % Apply 1 Application topically 2 (two) times daily. 03/24/23  Yes Particia Nearing, PA-C  Ascorbic Acid (VITAMIN C) 500 MG CAPS 1 tablet Orally Once a day    [provider]  aspirin EC 81 MG tablet Take 1 tablet (81 mg total) by mouth daily. Swallow whole. 11/23/21   Christell Constant, MD  Brewers Yeast 487.5 MG TABS two tablets Orally once a day    [provider]  Calcium Carbonate-Vitamin D (CALCIUM + D PO) Take 1 tablet by mouth daily.     [provider]  ezetimibe (ZETIA) 10 MG tablet TAKE 1 TABLET BY MOUTH EVERY DAY 09/18/22   Chandrasekhar, Mahesh A, MD  fish oil-omega-3 fatty acids 1000 MG capsule Take 1 g by mouth daily.     [provider]  Garlic 500 MG CAPS 1 capsule Orally once a day    [provider]  Homeopathic Products (LEG CRAMPS PO) Take 2  tablets by mouth 2 (two) times daily as needed (leg/hand cramps).    [provider]  ibuprofen (ADVIL) 800 MG tablet Take 800 mg by mouth every 6 (six) hours as needed. 10/23/21   [provider]  levothyroxine (SYNTHROID) 100 MCG tablet Take 1 tablet (100 mcg total) by mouth daily. 03/13/23   Reather Littler, MD  Multiple Vitamins-Minerals (PRESERVISION AREDS) CAPS 2 capsules Orally once a day    [provider]  nitroGLYCERIN (NITROSTAT) 0.4 MG SL tablet Place 1 tablet (0.4 mg total) under the tongue every 5 (five) minutes as needed for chest pain (may take up to 3 tablets). 12/15/21   Christell Constant, MD  phenazopyridine (PYRIDIUM) 100 MG tablet Take 1 tablet (100 mg  total) by mouth 3 (three) times daily as needed for pain. 10/30/22   Valentino Nose, NP  pravastatin (PRAVACHOL) 20 MG tablet TAKE 1 TABLET BY MOUTH EVERY DAY IN THE EVENING 09/18/22   Christell Constant, MD  Probiotic Product (PROBIOTIC DAILY) CAPS Take 1 capsule by mouth daily.    [provider]  sodium fluoride (SODIUM FLUORIDE 5000 PLUS) 1.1 % CREA dental cream as directed Dental daily    [provider]  valACYclovir (VALTREX) 500 MG tablet Take 500 mg by mouth daily. 07/30/22   [provider]  VITAMIN E PO Take 1 tablet by mouth daily. Unsure of dose    [provider]    Family History Family History  Problem Relation Age of Onset   Heart disease Father    Pulmonary fibrosis Father    Hypertension Father    Pancreatic cancer Sister    Diabetes Maternal Aunt    Sleep apnea Son    Breast cancer Neg Hx     Social History Social History   Tobacco Use   Smoking status: Every Day    Current packs/day: 0.30    Average packs/day: 0.3 packs/day for 40.0 years (12.0 ttl pk-yrs)    Types: Cigarettes   Smokeless tobacco: Never   Tobacco comments:    trying to quit. Pt states she is smoking about 5 cigarettes a day  Substance Use Topics   Alcohol use: Yes    Alcohol/week: 0.0 standard drinks of alcohol    Comment: seldom   Drug use: No     Allergies   Erythromycin base, Hydrocodone-acetaminophen, Hydrocodone-acetaminophen, Carbamide peroxide, and Lipitor [atorvastatin]   Review of Systems Review of Systems PER HPI  Physical Exam Triage Vital Signs ED Triage Vitals  Encounter Vitals Group     BP 03/24/23 1224 (!) 163/74     Systolic BP Percentile --      Diastolic BP Percentile --      Pulse Rate 03/24/23 1224 81     Resp 03/24/23 1224 20     Temp 03/24/23 1224 97.8 F (36.6 C)     Temp Source 03/24/23 1224 Oral     SpO2 03/24/23 1224 94 %     Weight --      Height --      Head Circumference --      Peak Flow --       Pain Score 03/24/23 1222 2     Pain Loc --      Pain Education --      Exclude from Growth Chart --    No data found.  Updated Vital Signs BP (!) 163/74 (BP Location: Right Arm)   Pulse 81   Temp 97.8 F (36.6  C) (Oral)   Resp 20   SpO2 94%   Visual Acuity Right Eye Distance:   Left Eye Distance:   Bilateral Distance:    Right Eye Near:   Left Eye Near:    Bilateral Near:     Physical Exam Vitals and nursing note reviewed.  Constitutional:      Appearance: Normal appearance. She is not ill-appearing.  HENT:     Head: Atraumatic.     Mouth/Throat:     Mouth: Mucous membranes are moist.     Pharynx: Oropharynx is clear.     Comments: Oral airway patent and benign Eyes:     Extraocular Movements: Extraocular movements intact.     Conjunctiva/sclera: Conjunctivae normal.  Cardiovascular:     Rate and Rhythm: Normal rate and regular rhythm.     Heart sounds: Normal heart sounds.  Pulmonary:     Effort: Pulmonary effort is normal. No respiratory distress.     Breath sounds: Normal breath sounds. No wheezing or rales.  Musculoskeletal:        General: Swelling present. Normal range of motion.     Cervical back: Normal range of motion and neck supple.  Skin:    General: Skin is warm and dry.     Findings: Erythema present.     Comments: Distal right index finger mildly erythematous, with trace edema.  Mildly tender to palpation.  No extension to edema and no rashes elsewhere  Neurological:     Mental Status: She is alert and oriented to person, place, and time.     Comments: Bilateral upper extremities neurovascularly intact  Psychiatric:        Mood and Affect: Mood normal.        Thought Content: Thought content normal.        Judgment: Judgment normal.      UC Treatments / Results  Labs (all labs ordered are listed, but only abnormal results are displayed) Labs Reviewed - No data to display  EKG   Radiology No results found.  Procedures Procedures  (including critical care time)  Medications Ordered in UC Medications - No data to display  Initial Impression / Assessment and Plan / UC Course  I have reviewed the triage vital signs and the nursing notes.  Pertinent labs & imaging results that were available during my care of the patient were reviewed by me and considered in my medical decision making (see chart for details).     Mild local reaction to yellowjacket sting to the distal fingertip.  No evidence of a systemic reaction at this time, discussed antihistamines, triamcinolone, ice, elevation.  Given her historical reaction to multiple stings at 1 time, will provide an EpiPen in case symptoms are escalating quickly.  Discussed ED follow-up for worsening symptoms at any time.  Final Clinical Impressions(s) / UC Diagnoses   Final diagnoses:  Insect stings, accidental or unintentional, initial encounter  Finger swelling     Discharge Instructions      It does not appear that the sting is causing an overall reaction in your body at this time, however this of course can progress over time so keep a close eye out for symptoms such as a hives rash, throat itching or swelling, difficulty breathing, heart racing.  Taking antihistamine such as Zyrtec or Claritin twice daily, apply the triamcinolone cream to the swollen area of the sting and ice off-and-on.  If your symptoms start worsening at any time, you will have access to the  EpiPen that was sent over but should also go immediately to the emergency department.    ED Prescriptions     Medication Sig Dispense Auth. Provider   triamcinolone cream (KENALOG) 0.1 % Apply 1 Application topically 2 (two) times daily. 30 g Particia Nearing, [place]   EPINEPHrine 0.3 mg/0.3 mL IJ SOAJ injection Inject 0.3 mg into the muscle as needed for anaphylaxis. 1 each Particia Nearing, PA-C      PDMP not reviewed this encounter.   Particia Nearing, [place] 03/27/23 1050

## 2023-04-08 ENCOUNTER — Other Ambulatory Visit: Payer: Self-pay | Admitting: Family Medicine

## 2023-04-08 DIAGNOSIS — Z1231 Encounter for screening mammogram for malignant neoplasm of breast: Secondary | ICD-10-CM

## 2023-04-10 NOTE — Telephone Encounter (Signed)
Patient wants to confirm when she will be able to drive.

## 2023-04-10 NOTE — Telephone Encounter (Signed)
Spoke with patient and reminded her that she is not cleared to drive again until 16/10/96. Patient verbalized understanding and expressed appreciation for call.

## 2023-04-12 ENCOUNTER — Other Ambulatory Visit: Payer: Self-pay | Admitting: Endocrinology

## 2023-04-24 ENCOUNTER — Other Ambulatory Visit (INDEPENDENT_AMBULATORY_CARE_PROVIDER_SITE_OTHER): Payer: Medicare HMO

## 2023-04-24 DIAGNOSIS — E890 Postprocedural hypothyroidism: Secondary | ICD-10-CM | POA: Diagnosis not present

## 2023-04-24 LAB — TSH: TSH: 0.16 u[IU]/mL — ABNORMAL LOW (ref 0.35–5.50)

## 2023-04-24 LAB — T4, FREE: Free T4: 1.39 ng/dL (ref 0.60–1.60)

## 2023-04-26 ENCOUNTER — Other Ambulatory Visit: Payer: Self-pay | Admitting: Endocrinology

## 2023-05-01 ENCOUNTER — Encounter: Payer: Self-pay | Admitting: Endocrinology

## 2023-05-01 ENCOUNTER — Ambulatory Visit: Payer: Medicare HMO | Admitting: Endocrinology

## 2023-05-01 VITALS — BP 145/85 | HR 68 | Ht 67.0 in | Wt 116.4 lb

## 2023-05-01 DIAGNOSIS — E559 Vitamin D deficiency, unspecified: Secondary | ICD-10-CM

## 2023-05-01 DIAGNOSIS — M810 Age-related osteoporosis without current pathological fracture: Secondary | ICD-10-CM

## 2023-05-01 DIAGNOSIS — E890 Postprocedural hypothyroidism: Secondary | ICD-10-CM | POA: Diagnosis not present

## 2023-05-01 DIAGNOSIS — Z8639 Personal history of other endocrine, nutritional and metabolic disease: Secondary | ICD-10-CM

## 2023-05-01 MED ORDER — LEVOTHYROXINE SODIUM 88 MCG PO TABS: 88.0000 ug | ORAL_TABLET | Freq: Every day | ORAL | 4 refills | Status: DC

## 2023-05-01 NOTE — Progress Notes (Signed)
Outpatient Endocrinology Note [place] Christphor Groft, MD  05/01/23  Patient's Name: Victoria Blair    DOB: [birth year]    MRN: [id number]  REASON OF VISIT: Follow-up for hypothyroidism /osteoporosis.  REFERRING PROVIDER:   PCP: Joycelyn Rua, MD  HISTORY OF PRESENT ILLNESS:   Victoria Blair is a 77 y.o. old female with past medical history as listed below is presented for a follow up of hypothyroidism /osteoporosis/vitamin D deficiency.   Pertinent History: # Hypothyroidism -Patient has postablative hypothyroidism was diagnosed in 1978 after radioactive iodine I-131 ablation for Graves' disease.  She has been on thyroid hormone replacement/levothyroxine and dose had been adjusted periodically.  # Osteoporosis Patient has osteoporosis T-score at the spine was -2.8 in October 2018.  She was previously given Actonel but he stopped because of difficulty with compliance.  She had taken Evista but did not take it regularly, started in October 2019, was subsequently stopped due to cost. T-score at the spine was -3.1 and declined by 4.6% in November 2020. -She was started on Reclast 5 mg IV infusion annually.  First dose was in December 2020.  -DEXA scan in November 2022 with lowest T-score of -2.7 at the spine, improvement on bone density at the spine and some decline in the hips. -She has been taking calcium and vitamin D supplement.  Vitamin D level is 32.69 in January 2024.  Results:   Lumbar spine L1-L4 Femoral neck (FN) 33% distal radius  T-score -2.7 RFN: -1.9 LFN: -2.6 n/a  Change in BMD from previous DXA test (%) Up 5.9% Down 5.3% n/a  (*) statistically significant   Assessment: Patient has OSTEOPOROSIS according to the [facility] classification for osteoporosis (see below).   Interval history 05/01/23 Patient has been taking levothyroxine 100 mg daily, increased in July due to elevated TSH.  Complains of hair loss otherwise denies palpitation or heat intolerance.  Recent thyroid lab  with low TSH.  She has not fallen fracture.  She has been taking calcium and vitamin D supplement.    TSH 0.35 - 5.50 uIU/mL 21.57 (H) 0.16 (L)  T4,Free(Direct) 0.60 - 1.60 ng/dL 1.76 1.60  (H): Data is abnormally high (L): Data is abnormally low  REVIEW OF SYSTEMS:  As per history of present illness.   PAST MEDICAL HISTORY: Past Medical History:  Diagnosis Date   Aortic insufficiency 02/11/2023   TTE 01/23/23: EF 65-70, no RWMA, mod LVH, Gr 1 DD, NL RVSF, NL PASP, RVSP 29.4, mod AI, RAP 3   Breast cancer (HCC) 1997   Depression    GERD (gastroesophageal reflux disease)    Graves disease    Hemorrhoids    Hypertension    Migraines    Osteoporosis    Personal history of radiation therapy 1998   Vitamin D deficiency     PAST SURGICAL HISTORY: Past Surgical History:  Procedure Laterality Date   BREAST BIOPSY Right 08/11/1996   BREAST LUMPECTOMY Right 1998   CESAREAN SECTION     CHOLECYSTECTOMY     SPINE SURGERY      ALLERGIES: Allergies  Allergen Reactions   Erythromycin Base Other (See Comments) and Anaphylaxis    Other  Other, Other   Hydrocodone-Acetaminophen     Patient passed out with this medication   Hydrocodone-Acetaminophen Nausea And Vomiting    Patient passed out with this medication   Carbamide Peroxide    Lipitor [Atorvastatin]     Myalgias    FAMILY HISTORY:  Family History  Problem Relation Age of  Onset   Heart disease Father    Pulmonary fibrosis Father    Hypertension Father    Pancreatic cancer Sister    Diabetes Maternal Aunt    Sleep apnea Son    Breast cancer Neg Hx     SOCIAL HISTORY: Social History   Socioeconomic History   Marital status: Divorced    Spouse name: Not on file   Number of children: Not on file   Years of education: Not on file   Highest education level: Not on file  Occupational History   Not on file  Tobacco Use   Smoking status: Every Day    Current packs/day: 0.30    Average packs/day: 0.3 packs/day  for 40.0 years (12.0 ttl pk-yrs)    Types: Cigarettes   Smokeless tobacco: Never   Tobacco comments:    trying to quit. Pt states she is smoking about 5 cigarettes a day  Substance and Sexual Activity   Alcohol use: Yes    Alcohol/week: 0.0 standard drinks of alcohol    Comment: seldom   Drug use: No   Sexual activity: Yes  Other Topics Concern   Not on file  Social History Narrative   ** Merged History Encounter **       Social Determinants of Health   Financial Resource Strain: Not on file  Food Insecurity: Not on file  Transportation Needs: Not on file  Physical Activity: Not on file  Stress: Not on file  Social Connections: Not on file    MEDICATIONS:  Current Outpatient Medications  Medication Sig Dispense Refill   Ascorbic Acid (VITAMIN C) 500 MG CAPS 1 tablet Orally Once a day     aspirin EC 81 MG tablet Take 1 tablet (81 mg total) by mouth daily. Swallow whole. 30 tablet 11   Brewers Yeast 487.5 MG TABS two tablets Orally once a day     Calcium Carbonate-Vitamin D (CALCIUM + D PO) Take 1 tablet by mouth daily.      EPINEPHrine 0.3 mg/0.3 mL IJ SOAJ injection Inject 0.3 mg into the muscle as needed for anaphylaxis. 1 each 0   ezetimibe (ZETIA) 10 MG tablet TAKE 1 TABLET BY MOUTH EVERY DAY 90 tablet 3   fish oil-omega-3 fatty acids 1000 MG capsule Take 1 g by mouth daily.      Garlic 500 MG CAPS 1 capsule Orally once a day     Homeopathic Products (LEG CRAMPS PO) Take 2 tablets by mouth 2 (two) times daily as needed (leg/hand cramps).     ibuprofen (ADVIL) 800 MG tablet Take 800 mg by mouth every 6 (six) hours as needed.     Multiple Vitamins-Minerals (PRESERVISION AREDS) CAPS 2 capsules Orally once a day     nitroGLYCERIN (NITROSTAT) 0.4 MG SL tablet Place 1 tablet (0.4 mg total) under the tongue every 5 (five) minutes as needed for chest pain (may take up to 3 tablets). 25 tablet 1   pravastatin (PRAVACHOL) 20 MG tablet TAKE 1 TABLET BY MOUTH EVERY DAY IN THE EVENING  90 tablet 3   Probiotic Product (PROBIOTIC DAILY) CAPS Take 1 capsule by mouth daily.     sodium fluoride (SODIUM FLUORIDE 5000 PLUS) 1.1 % CREA dental cream as directed Dental daily     triamcinolone cream (KENALOG) 0.1 % Apply 1 Application topically 2 (two) times daily. 30 g 0   valACYclovir (VALTREX) 500 MG tablet Take 500 mg by mouth daily.     VITAMIN E PO Take  1 tablet by mouth daily. Unsure of dose     levothyroxine (SYNTHROID) 88 MCG tablet Take 1 tablet (88 mcg total) by mouth daily. 90 tablet 4   phenazopyridine (PYRIDIUM) 100 MG tablet Take 1 tablet (100 mg total) by mouth 3 (three) times daily as needed for pain. (Patient not taking: Reported on 05/01/2023) 10 tablet 0   No current facility-administered medications for this visit.    PHYSICAL EXAM: Vitals:   05/01/23 0833  BP: (!) 145/85  Pulse: 68  SpO2: 99%  Weight: 116 lb 6.4 oz (52.8 kg)  Height: 5\' 7"  (1.702 m)   Body mass index is 18.23 kg/m.  Wt Readings from Last 3 Encounters:  05/01/23 116 lb 6.4 oz (52.8 kg)  03/13/23 120 lb (54.4 kg)  02/11/23 123 lb 9.6 oz (56.1 kg)     General: Well developed, well nourished female in no apparent distress.  HEENT: AT/[place], no external lesions. Hearing intact to the spoken word Eyes: Conjunctiva clear and no icterus. Neck: Trachea midline, neck supple without appreciable thyromegaly or lymphadenopathy and no palpable thyroid nodules Lungs: Clear to auscultation, no wheeze. Respirations not labored Heart: S1S2, Regular in rate and rhythm.  Abdomen: Soft, non tender, non distended, no masses, no striae Neurologic: Alert, oriented, normal speech, deep tendon biceps reflexes normal,  no gross focal neurological deficit Extremities: No pedal pitting edema, no tremors of outstretched hands Skin: Warm, color good.  Psychiatric: Does not appear depressed or anxious  PERTINENT HISTORIC LABORATORY AND IMAGING STUDIES:  All pertinent laboratory results were reviewed. Please see HPI  also for further details.   TSH  Date Value Ref Range Status  04/24/2023 0.16 (L) 0.35 - 5.50 uIU/mL Final  03/11/2023 21.57 (H) 0.35 - 5.50 uIU/mL Final  11/24/2022 1.077 0.350 - 4.500 uIU/mL Final    Comment:    Performed by a 3rd Generation assay with a functional sensitivity of <=0.01 uIU/mL. Performed at [facility], [address] [place], [place] [address]      ASSESSMENT / PLAN  1. Hypothyroidism, postradioiodine therapy   2. Osteoporosis, post-menopausal   3. Vitamin D deficiency   4. H/O Graves' disease    -She has postablative hypothyroidism.  Currently taking levothyroxine 100 mg daily.  Recent lab with low TSH.  Plan: -Decrease levothyroxine to 88 mcg daily. -Check TSH, free T4 in 2 months.  # Osteoporosis. -Last DEXA scan in November 2022, consistent with osteoporosis with lowest T-score of -2.7 at lumbar spine. -She is currently on Reclast 5 mg IV infusion annually. -Next dose of Reclast infusion in January 2025. -Continue current dose of calcium and vitamin D supplement.  Discussed fall precautions.  Weightbearing exercise as tolerated.   We discussed the medical need for compliance with levothyroxine therapy, that it is a hormone necessary for life, and that serious consequences may result from noncompliance. Discussed the proper method of levothyroxine administration: take on an empty stomach in the morning, with water, waiting thirty to sixty minutes before taking any other beverages or food. Also reviewed the need to take calcium or iron supplements or multivitamin (that may contain iron or calcium) at least 4 hours after levothyroxine administration.   Victoria "Myrene Buddy" was seen today for hypothyroidism.  Diagnoses and all orders for this visit:  Hypothyroidism, postradioiodine therapy -     T4, free; Future -     TSH; Future  Osteoporosis, post-menopausal  Vitamin D deficiency  H/O Graves' disease  Other orders -  levothyroxine (SYNTHROID) 88 MCG tablet; Take 1 tablet (88 mcg total) by mouth daily.    DISPOSITION Follow up in clinic in 2 months suggested.  Thyroid labs 1 week prior to visit.  All questions answered and patient verbalized understanding of the plan.  [place] Jessia Kief, MD [facility] Endocrinology [facility] [address] [place], [address] [place], [place] [address] Phone # [phone]  At least part of this note was generated using voice recognition software. Inadvertent word errors may have occurred, which were not recognized during the proofreading process.

## 2023-05-03 ENCOUNTER — Ambulatory Visit
Admission: RE | Admit: 2023-05-03 | Discharge: 2023-05-03 | Disposition: A | Discharge status: Home / Self Care | Payer: Medicare HMO | Source: Ambulatory Visit | Attending: Family Medicine | Admitting: Family Medicine

## 2023-05-03 DIAGNOSIS — Z1231 Encounter for screening mammogram for malignant neoplasm of breast: Secondary | ICD-10-CM | POA: Diagnosis not present

## 2023-05-07 ENCOUNTER — Other Ambulatory Visit: Payer: Self-pay | Admitting: Family Medicine

## 2023-05-07 DIAGNOSIS — R928 Other abnormal and inconclusive findings on diagnostic imaging of breast: Secondary | ICD-10-CM

## 2023-05-13 DIAGNOSIS — N952 Postmenopausal atrophic vaginitis: Secondary | ICD-10-CM | POA: Diagnosis not present

## 2023-05-13 DIAGNOSIS — R87810 Cervical high risk human papillomavirus (HPV) DNA test positive: Secondary | ICD-10-CM | POA: Diagnosis not present

## 2023-05-29 DIAGNOSIS — H26491 Other secondary cataract, right eye: Secondary | ICD-10-CM | POA: Diagnosis not present

## 2023-05-29 DIAGNOSIS — H0100A Unspecified blepharitis right eye, upper and lower eyelids: Secondary | ICD-10-CM | POA: Diagnosis not present

## 2023-05-29 DIAGNOSIS — H43813 Vitreous degeneration, bilateral: Secondary | ICD-10-CM | POA: Diagnosis not present

## 2023-05-29 DIAGNOSIS — H0100B Unspecified blepharitis left eye, upper and lower eyelids: Secondary | ICD-10-CM | POA: Diagnosis not present

## 2023-05-29 DIAGNOSIS — H5213 Myopia, bilateral: Secondary | ICD-10-CM | POA: Diagnosis not present

## 2023-05-29 DIAGNOSIS — H52203 Unspecified astigmatism, bilateral: Secondary | ICD-10-CM | POA: Diagnosis not present

## 2023-05-29 DIAGNOSIS — H524 Presbyopia: Secondary | ICD-10-CM | POA: Diagnosis not present

## 2023-05-29 DIAGNOSIS — H35372 Puckering of macula, left eye: Secondary | ICD-10-CM | POA: Diagnosis not present

## 2023-06-05 ENCOUNTER — Telehealth: Payer: Self-pay | Admitting: Internal Medicine

## 2023-06-05 NOTE — Telephone Encounter (Signed)
Patient called again to follow-up on discussing results from chiropractor testing.

## 2023-06-05 NOTE — Telephone Encounter (Signed)
Pt is requesting a callback from nurse regarding her being seen at chiropractor office today with Dr. Manson Passey and she'd like to share her xray results. Please advise

## 2023-06-05 NOTE — Telephone Encounter (Signed)
Called pt to f/u reports chiropractor saw something with Aorta on Xray performed.  Pt does not know what testing showed.  Advised pt to have office to fax over results to [phone].  Pt will have faxed over no further concerns at this time.

## 2023-06-14 ENCOUNTER — Telehealth: Payer: Self-pay

## 2023-06-14 NOTE — Telephone Encounter (Signed)
Patient requested call back to verify next appointment times

## 2023-06-18 DIAGNOSIS — K1329 Other disturbances of oral epithelium, including tongue: Secondary | ICD-10-CM | POA: Diagnosis not present

## 2023-06-24 ENCOUNTER — Other Ambulatory Visit: Payer: Self-pay | Admitting: Internal Medicine

## 2023-06-24 NOTE — Telephone Encounter (Signed)
Called pt back in regards to fax from Chiropractor.  Pt reports has an OV this week.  Is unsure if she has our fax number unable to write it down.  Will call  our office back if fax number is needed.

## 2023-06-26 ENCOUNTER — Other Ambulatory Visit (INDEPENDENT_AMBULATORY_CARE_PROVIDER_SITE_OTHER): Payer: Medicare HMO

## 2023-06-26 DIAGNOSIS — E890 Postprocedural hypothyroidism: Secondary | ICD-10-CM | POA: Diagnosis not present

## 2023-06-26 LAB — TSH: TSH: 22.51 u[IU]/mL — ABNORMAL HIGH (ref 0.35–5.50)

## 2023-06-26 LAB — T4, FREE: Free T4: 0.63 ng/dL (ref 0.60–1.60)

## 2023-06-27 DIAGNOSIS — K1329 Other disturbances of oral epithelium, including tongue: Secondary | ICD-10-CM | POA: Diagnosis not present

## 2023-07-03 ENCOUNTER — Encounter: Payer: Self-pay | Admitting: Endocrinology

## 2023-07-03 ENCOUNTER — Ambulatory Visit: Payer: Medicare HMO | Admitting: Endocrinology

## 2023-07-03 VITALS — BP 112/80 | HR 79 | Resp 20 | Ht 67.0 in | Wt 124.8 lb

## 2023-07-03 DIAGNOSIS — E890 Postprocedural hypothyroidism: Secondary | ICD-10-CM

## 2023-07-03 DIAGNOSIS — M810 Age-related osteoporosis without current pathological fracture: Secondary | ICD-10-CM | POA: Diagnosis not present

## 2023-07-03 NOTE — Patient Instructions (Signed)
Keep same dose of levothyroxine 88 mcg daily.  Do not take levothyroxine with calcium and vitamins in the morning. You can take calcium and Vitamins after eating.   Always take levothyroxine 30 minutes before breakfast in empty stomach.

## 2023-07-03 NOTE — Progress Notes (Signed)
Outpatient Endocrinology Note Victoria Charlei Ramsaran, MD  07/03/23  Patient's Name: Victoria Blair    DOB: 1946-03-26    MRN: [id number]  REASON OF VISIT: Follow-up for hypothyroidism /osteoporosis.  PCP: Joycelyn Rua, MD  HISTORY OF PRESENT ILLNESS:   Victoria Blair is a 77 y.o. old female with past medical history as listed below is presented for a follow up of hypothyroidism /osteoporosis/vitamin D deficiency.   Pertinent History: # Hypothyroidism -Patient has postablative hypothyroidism was diagnosed in 1978 after radioactive iodine I-131 ablation for Graves' disease.  She has been on thyroid hormone replacement/levothyroxine and dose had been adjusted periodically.  # Osteoporosis Patient has osteoporosis T-score at the spine was -2.8 in October 2018.  She was previously given Actonel but he stopped because of difficulty with compliance.  She had taken Evista but did not take it regularly, started in October 2019, was subsequently stopped due to cost. T-score at the spine was -3.1 and declined by 4.6% in November 2020. -She was started on Reclast 5 mg IV infusion annually.  First dose was in December 2020.  -DEXA scan in November 2022 with lowest T-score of -2.7 at the spine, improvement on bone density at the spine and some decline in the hips. -She has been taking calcium and vitamin D supplement.  Vitamin D level is 32.69 in January 2024.  Results:   Lumbar spine L1-L4 Femoral neck (FN) 33% distal radius  T-score -2.7 RFN: -1.9 LFN: -2.6 n/a  Change in BMD from previous DXA test (%) Up 5.9% Down 5.3% n/a  (*) statistically significant   Assessment: Patient has OSTEOPOROSIS according to the [facility] classification for osteoporosis (see below).   Interval history 07/03/23 Patient presented for follow-up of hypothyroidism.  She has been taking levothyroxine 88 mcg daily.  In August levothyroxine was decreased from 100 mcg daily to 88 mcg daily when TSH was low 0.16.  Patient  reports compliance with taking levothyroxine however taking with calcium in the morning before breakfast.  She reports normal energy.  No change in bowel habit.  No new complaints today.  Recent lab with high TSH of 22 and normal free T4 of 0.63 as follows.   Latest Reference Range & Units 06/26/23 08:39  TSH 0.35 - 5.50 uIU/mL 22.51 (H)  T4,Free(Direct) 0.60 - 1.60 ng/dL 4.09  (H): Data is abnormally high   REVIEW OF SYSTEMS:  As per history of present illness.   PAST MEDICAL HISTORY: Past Medical History:  Diagnosis Date   Aortic insufficiency 02/11/2023   TTE 01/23/23: EF 65-70, no RWMA, mod LVH, Gr 1 DD, NL RVSF, NL PASP, RVSP 29.4, mod AI, RAP 3   Breast cancer (HCC) 1997   Depression    GERD (gastroesophageal reflux disease)    Graves disease    Hemorrhoids    Hypertension    Migraines    Osteoporosis    Personal history of radiation therapy 1998   Vitamin D deficiency     PAST SURGICAL HISTORY: Past Surgical History:  Procedure Laterality Date   BREAST BIOPSY Right 08/11/1996   BREAST LUMPECTOMY Right 1998   CESAREAN SECTION     CHOLECYSTECTOMY     SPINE SURGERY      ALLERGIES: Allergies  Allergen Reactions   Erythromycin Base Other (See Comments) and Anaphylaxis    Other  Other, Other   Hydrocodone-Acetaminophen     Patient passed out with this medication   Hydrocodone-Acetaminophen Nausea And Vomiting    Patient passed out with  this medication   Carbamide Peroxide    Lipitor [Atorvastatin]     Myalgias    FAMILY HISTORY:  Family History  Problem Relation Age of Onset   Heart disease Father    Pulmonary fibrosis Father    Hypertension Father    Pancreatic cancer Sister    Diabetes Maternal Aunt    Sleep apnea Son    Breast cancer Neg Hx     SOCIAL HISTORY: Social History   Socioeconomic History   Marital status: Divorced    Spouse name: Not on file   Number of children: Not on file   Years of education: Not on file   Highest  education level: Not on file  Occupational History   Not on file  Tobacco Use   Smoking status: Every Day    Current packs/day: 0.30    Average packs/day: 0.3 packs/day for 40.0 years (12.0 ttl pk-yrs)    Types: Cigarettes   Smokeless tobacco: Never   Tobacco comments:    trying to quit. Pt states she is smoking about 5 cigarettes a day  Substance and Sexual Activity   Alcohol use: Yes    Alcohol/week: 0.0 standard drinks of alcohol    Comment: seldom   Drug use: No   Sexual activity: Yes  Other Topics Concern   Not on file  Social History Narrative   ** Merged History Encounter **       Social Determinants of Health   Financial Resource Strain: Not on file  Food Insecurity: Not on file  Transportation Needs: Not on file  Physical Activity: Not on file  Stress: Not on file  Social Connections: Not on file    MEDICATIONS:  Current Outpatient Medications  Medication Sig Dispense Refill   Ascorbic Acid (VITAMIN C) 500 MG CAPS 1 tablet Orally Once a day     aspirin EC 81 MG tablet Take 1 tablet (81 mg total) by mouth daily. Swallow whole. 30 tablet 11   Brewers Yeast 487.5 MG TABS two tablets Orally once a day     Calcium Carbonate-Vitamin D (CALCIUM + D PO) Take 1 tablet by mouth daily.      EPINEPHrine 0.3 mg/0.3 mL IJ SOAJ injection Inject 0.3 mg into the muscle as needed for anaphylaxis. 1 each 0   ezetimibe (ZETIA) 10 MG tablet TAKE 1 TABLET BY MOUTH EVERY DAY 90 tablet 2   fish oil-omega-3 fatty acids 1000 MG capsule Take 1 g by mouth daily.      Garlic 500 MG CAPS 1 capsule Orally once a day     Homeopathic Products (LEG CRAMPS PO) Take 2 tablets by mouth 2 (two) times daily as needed (leg/hand cramps).     ibuprofen (ADVIL) 800 MG tablet Take 800 mg by mouth every 6 (six) hours as needed.     levothyroxine (SYNTHROID) 88 MCG tablet Take 1 tablet (88 mcg total) by mouth daily. 90 tablet 4   Multiple Vitamins-Minerals (PRESERVISION AREDS) CAPS 2 capsules Orally once a  day     nitroGLYCERIN (NITROSTAT) 0.4 MG SL tablet Place 1 tablet (0.4 mg total) under the tongue every 5 (five) minutes as needed for chest pain (may take up to 3 tablets). 25 tablet 1   phenazopyridine (PYRIDIUM) 100 MG tablet Take 1 tablet (100 mg total) by mouth 3 (three) times daily as needed for pain. 10 tablet 0   pravastatin (PRAVACHOL) 20 MG tablet TAKE 1 TABLET BY MOUTH EVERY DAY IN THE EVENING 90 tablet  3   Probiotic Product (PROBIOTIC DAILY) CAPS Take 1 capsule by mouth daily.     sodium fluoride (SODIUM FLUORIDE 5000 PLUS) 1.1 % CREA dental cream as directed Dental daily     triamcinolone cream (KENALOG) 0.1 % Apply 1 Application topically 2 (two) times daily. 30 g 0   valACYclovir (VALTREX) 500 MG tablet Take 500 mg by mouth daily.     VITAMIN E PO Take 1 tablet by mouth daily. Unsure of dose     No current facility-administered medications for this visit.    PHYSICAL EXAM: Vitals:   07/03/23 0950  BP: 112/80  Pulse: 79  Resp: 20  SpO2: 99%  Weight: 124 lb 12.8 oz (56.6 kg)  Height: 5\' 7"  (1.702 m)   Body mass index is 19.55 kg/m.  Wt Readings from Last 3 Encounters:  07/03/23 124 lb 12.8 oz (56.6 kg)  05/01/23 116 lb 6.4 oz (52.8 kg)  03/13/23 120 lb (54.4 kg)     General: Well developed, well nourished female in no apparent distress.  HEENT: AT/[place], no external lesions. Hearing intact to the spoken word Eyes: Conjunctiva clear and no icterus. Abdomen: Soft, non tender, non distended Neurologic: Alert, oriented, normal speech, deep tendon biceps reflexes normal,  no gross focal neurological deficit Extremities: No pedal pitting edema, no tremors of outstretched hands Skin: Warm, color good.  Dry skin. Psychiatric: Does not appear depressed or anxious  PERTINENT HISTORIC LABORATORY AND IMAGING STUDIES:  All pertinent laboratory results were reviewed. Please see HPI also for further details.   TSH  Date Value Ref Range Status  06/26/2023 22.51 (H) 0.35 - 5.50  uIU/mL Final  04/24/2023 0.16 (L) 0.35 - 5.50 uIU/mL Final  03/11/2023 21.57 (H) 0.35 - 5.50 uIU/mL Final     ASSESSMENT / PLAN  1. Hypothyroidism, postradioiodine therapy   2. Osteoporosis, post-menopausal     -She has postablative hypothyroidism.  Currently taking levothyroxine daily.  Recent lab with high TSH.  She has been taking together with calcium.  Also concerned about compliance however patient reports compliance and taking in the morning.  Plan: -Will continue current dose of levothyroxine 88 mcg daily. -Asked patient not to take calcium to be done with levothyroxine.  Okay to take calcium after breakfast.  Asked not to take any vitamin supplement with levothyroxine as well. -Discussed about proper intake of levothyroxine in the morning before breakfast, advised to take as soon as she wakes up in the empty stomach.  Asked to take every day without missing. -Will check thyroid function test in 6 weeks.  If she continues to have abnormal TSH we will adjust the dose of levothyroxine at that time.  # Osteoporosis. -Last DEXA scan in November 2022, consistent with osteoporosis with lowest T-score of -2.7 at lumbar spine. -She is currently on Reclast 5 mg IV infusion annually. -Next dose of Reclast infusion in January 2025. -Continue current dose of calcium and vitamin D supplement.  Discussed fall precautions.  Weightbearing exercise as tolerated.   Diagnoses and all orders for this visit:  Hypothyroidism, postradioiodine therapy -     T4, free; Future -     TSH; Future  Osteoporosis, post-menopausal     DISPOSITION Follow up in clinic in 6 weeks suggested.  Thyroid labs 2-3 days prior to visit.  All questions answered and patient verbalized understanding of the plan.  Victoria Chong Wojdyla, MD [facility] Endocrinology [facility] [address] [place], [address] [place], [place] [address] Phone #  [phone]  At least part of this note was generated using voice  recognition software. Inadvertent word errors may have occurred, which were not recognized during the proofreading process.

## 2023-07-10 ENCOUNTER — Other Ambulatory Visit: Payer: Self-pay

## 2023-07-19 DIAGNOSIS — I10 Essential (primary) hypertension: Secondary | ICD-10-CM | POA: Diagnosis not present

## 2023-07-20 ENCOUNTER — Other Ambulatory Visit: Payer: Medicare HMO

## 2023-07-31 ENCOUNTER — Other Ambulatory Visit: Payer: Self-pay | Admitting: Family Medicine

## 2023-07-31 DIAGNOSIS — F17200 Nicotine dependence, unspecified, uncomplicated: Secondary | ICD-10-CM

## 2023-07-31 DIAGNOSIS — F17210 Nicotine dependence, cigarettes, uncomplicated: Secondary | ICD-10-CM

## 2023-08-08 ENCOUNTER — Other Ambulatory Visit: Payer: Self-pay

## 2023-08-08 ENCOUNTER — Ambulatory Visit
Admission: RE | Admit: 2023-08-08 | Discharge: 2023-08-08 | Disposition: A | Discharge status: Home / Self Care | Payer: Medicare HMO | Source: Ambulatory Visit | Attending: Family Medicine | Admitting: Family Medicine

## 2023-08-08 ENCOUNTER — Other Ambulatory Visit: Payer: Self-pay | Admitting: Family Medicine

## 2023-08-08 DIAGNOSIS — E890 Postprocedural hypothyroidism: Secondary | ICD-10-CM

## 2023-08-08 DIAGNOSIS — R928 Other abnormal and inconclusive findings on diagnostic imaging of breast: Secondary | ICD-10-CM

## 2023-08-08 DIAGNOSIS — N6311 Unspecified lump in the right breast, upper outer quadrant: Secondary | ICD-10-CM | POA: Diagnosis not present

## 2023-08-08 DIAGNOSIS — R921 Mammographic calcification found on diagnostic imaging of breast: Secondary | ICD-10-CM | POA: Diagnosis not present

## 2023-08-08 DIAGNOSIS — N6310 Unspecified lump in the right breast, unspecified quadrant: Secondary | ICD-10-CM

## 2023-08-13 ENCOUNTER — Ambulatory Visit
Admission: RE | Admit: 2023-08-13 | Discharge: 2023-08-13 | Disposition: A | Discharge status: Home / Self Care | Payer: Medicare HMO | Source: Ambulatory Visit | Attending: Family Medicine | Admitting: Family Medicine

## 2023-08-13 DIAGNOSIS — N6310 Unspecified lump in the right breast, unspecified quadrant: Secondary | ICD-10-CM

## 2023-08-13 DIAGNOSIS — N6311 Unspecified lump in the right breast, upper outer quadrant: Secondary | ICD-10-CM | POA: Diagnosis not present

## 2023-08-13 DIAGNOSIS — C50411 Malignant neoplasm of upper-outer quadrant of right female breast: Secondary | ICD-10-CM | POA: Diagnosis not present

## 2023-08-13 HISTORY — PX: BREAST BIOPSY: SHX20

## 2023-08-14 ENCOUNTER — Ambulatory Visit: Payer: Medicare HMO | Admitting: Internal Medicine

## 2023-08-14 ENCOUNTER — Other Ambulatory Visit: Payer: Medicare HMO

## 2023-08-14 LAB — SURGICAL PATHOLOGY

## 2023-08-14 NOTE — Progress Notes (Deleted)
Cardiology Office Note:  .    Date:  08/14/2023  ID:  Theresia Lo, DOB March 11, 1946, MRN [id number] PCP: Joycelyn Rua, MD  [facility] Providers Cardiologist:  Christell Constant, MD { Click to update primary MD,subspecialty MD or APP then REFRESH:1}    CC: *** Consulted for the evaluation of *** at the behest of ***   History of Present Illness: .    DARIE CARNAHAN is a 77 y.o. female ***  @Discussed  the use of AI scribe software for clinical note transcription with the patient, who gave verbal consent to proceed.  History of Present Illness             Relevant histories: .  Social *** ROS: As per HPI.   Studies Reviewed: .   Cardiac Studies & Procedures     STRESS TESTS  MYOCARDIAL PERFUSION IMAGING 01/03/2022  Narrative   The study is normal. The study is low risk.   No ST deviation was noted.   LV perfusion is normal. There is no evidence of ischemia. There is no evidence of infarction.   Left ventricular function is normal. Nuclear stress EF: 67 %. The left ventricular ejection fraction is hyperdynamic (>65%). End diastolic cavity size is normal. End systolic cavity size is normal.  ECHOCARDIOGRAM  ECHOCARDIOGRAM COMPLETE 01/23/2023  Narrative ECHOCARDIOGRAM REPORT    Patient Name:   Victoria Blair Date of Exam: 01/23/2023 Medical Rec #:  [id number]          Height:       67.5 in Accession #:    [id number]         Weight:       124.0 lb Date of Birth:  17-Apr-1946           BSA:          1.660 m Patient Age:    76 years           BP:           130/60 mmHg Patient Gender: F                  HR:           83 bpm. Exam Location:  [address]  Procedure: 2D Echo, Cardiac Doppler and Color Doppler  Indications:    R55 Syncope  History:        Patient has prior history of Echocardiogram examinations, most recent 12/11/2021. Risk Factors:Hypertension. History of breast cancer.  Sonographer:    Daphine Deutscher  RDCS Referring Phys: [id number] Jonita Albee  IMPRESSIONS   1. Left ventricular ejection fraction, by estimation, is 65 to 70%. The left ventricle has normal function. The left ventricle has no regional wall motion abnormalities. There is moderate concentric left ventricular hypertrophy. Left ventricular diastolic parameters are consistent with Grade I diastolic dysfunction (impaired relaxation). 2. Right ventricular systolic function is normal. The right ventricular size is normal. There is normal pulmonary artery systolic pressure. The estimated right ventricular systolic pressure is 29.4 mmHg. 3. The mitral valve is normal in structure. No evidence of mitral valve regurgitation. No evidence of mitral stenosis. 4. The aortic valve is tricuspid. There is mild calcification of the aortic valve. Aortic valve regurgitation is moderate. No aortic stenosis is present. 5. The inferior vena cava is normal in size with greater than 50% respiratory variability, suggesting right atrial pressure of 3 mmHg.  FINDINGS Left Ventricle: Left ventricular ejection fraction, by estimation, is 65 to  70%. The left ventricle has normal function. The left ventricle has no regional wall motion abnormalities. The left ventricular internal cavity size was normal in size. There is moderate concentric left ventricular hypertrophy. Left ventricular diastolic parameters are consistent with Grade I diastolic dysfunction (impaired relaxation).  Right Ventricle: The right ventricular size is normal. No increase in right ventricular wall thickness. Right ventricular systolic function is normal. There is normal pulmonary artery systolic pressure. The tricuspid regurgitant velocity is 2.57 m/s, and with an assumed right atrial pressure of 3 mmHg, the estimated right ventricular systolic pressure is 29.4 mmHg.  Left Atrium: Left atrial size was normal in size.  Right Atrium: Right atrial size was normal in  size.  Pericardium: There is no evidence of pericardial effusion.  Mitral Valve: The mitral valve is normal in structure. There is mild calcification of the mitral valve leaflet(s). Mild mitral annular calcification. No evidence of mitral valve regurgitation. No evidence of mitral valve stenosis.  Tricuspid Valve: The tricuspid valve is normal in structure. Tricuspid valve regurgitation is mild.  Aortic Valve: The aortic valve is tricuspid. There is mild calcification of the aortic valve. Aortic valve regurgitation is moderate. Aortic regurgitation PHT measures 323 msec. No aortic stenosis is present.  Pulmonic Valve: The pulmonic valve was normal in structure. Pulmonic valve regurgitation is not visualized.  Aorta: The aortic root is normal in size and structure.  Venous: The inferior vena cava is normal in size with greater than 50% respiratory variability, suggesting right atrial pressure of 3 mmHg.  IAS/Shunts: No atrial level shunt detected by color flow Doppler.   LEFT VENTRICLE PLAX 2D LVIDd:         3.90 cm   Diastology LVIDs:         1.80 cm   LV e' medial:    5.08 cm/s LV PW:         1.00 cm   LV E/e' medial:  11.4 LV IVS:        1.00 cm   LV e' lateral:   5.77 cm/s LVOT diam:     1.90 cm   LV E/e' lateral: 10.0 LV SV:         70 LV SV Index:   42 LVOT Area:     2.84 cm   RIGHT VENTRICLE RV Basal diam:  3.60 cm RV S prime:     16.07 cm/s TAPSE (M-mode): 2.9 cm  LEFT ATRIUM             Index        RIGHT ATRIUM           Index LA diam:        3.10 cm 1.87 cm/m   RA Area:     12.50 cm LA Vol (A2C):   21.9 ml 13.20 ml/m  RA Volume:   29.40 ml  17.72 ml/m LA Vol (A4C):   26.8 ml 16.15 ml/m LA Biplane Vol: 26.2 ml 15.79 ml/m AORTIC VALVE LVOT Vmax:   118.33 cm/s LVOT Vmean:  80.400 cm/s LVOT VTI:    0.245 m AI PHT:      323 msec  AORTA Ao Root diam: 3.20 cm Ao Asc diam:  3.40 cm  MITRAL VALVE                TRICUSPID VALVE MV Area (PHT): 3.21 cm     TR  Peak grad:   26.4 mmHg MV Decel Time: 236 msec     TR Vmax:  257.00 cm/s MV E velocity: 57.65 cm/s MV A velocity: 100.50 cm/s  SHUNTS MV E/A ratio:  0.57         Systemic VTI:  0.25 m Systemic Diam: 1.90 cm  Dalton McleanMD Electronically signed by Wilfred Lacy Signature Date/Time: 01/23/2023/3:57:37 PM    Final   MONITORS  LONG TERM MONITOR (3-14 DAYS) 01/22/2023  Narrative   Patient had a minimum heart rate of 54 bpm, maximum heart rate of 182 bpm, and average heart rate of 80 bpm.   Predominant underlying rhythm was sinus rhythm.   One run of NSVT that lasted 6 beats.   Frequent paroxsymal SVT lasting 13 seconds at longest.   Isolated PACs were rare (<1.0%).   Isolated PVCs were rare (<1.0%).   No triggered and diary events.  Asymptomatic SVT.  CT SCANS  CT CORONARY FRACTIONAL FLOW RESERVE DATA PREP 12/12/2021  Narrative EXAM: CT FFR ANALYSIS  CLINICAL DATA:  Coronary artery disease (CAD) (Ped 0-17y)  FINDINGS: FFRct analysis was performed on the original cardiac CT angiogram dataset. Diagrammatic representation of the FFRct analysis is provided in a separate PDF document in PACS. This dictation was created using the PDF document and an interactive 3D model of the results. 3D model is not available in the EMR/PACS. Normal FFR range is >0.80. Indeterminate (grey) zone is 0.76-0.80.  1. Left Main: FFR = 1.00  2. LAD: Proximal FFR = 0.98, mid FFR = 0.95, distal FFR = 0.88 3. LCX: Proximal FFR = 0.98, distal FFR = not analyzed 4. RCA: Proximal FFR = Unable to be analyzed due to significant artifact.  IMPRESSION: 1.  CT FFR analysis showed no significant stenosis.  RECOMMENDATIONS: Guideline-directed medical therapy and aggressive risk factor modification for secondary prevention of coronary artery disease.   Electronically Signed By: Thomasene Ripple D.O. On: 12/14/2021 19:59   CT CORONARY MORPH W/CTA COR W/SCORE 12/12/2021  Addendum 12/12/2021  3:06  PM ADDENDUM REPORT: 12/12/2021 15:04  CLINICAL DATA:  This is a 77 year old female with anginal symptoms.  EXAM: Cardiac/Coronary  CTA  TECHNIQUE: The patient was scanned on a [employer].  FINDINGS: A 100 kV prospective scan was triggered in the descending thoracic aorta at 111 HU's. Axial non-contrast 3 mm slices were carried out through the heart. The data set was analyzed on a dedicated work station and scored using the Agatson method. Gantry rotation speed was 250 msecs and collimation was .6 mm. No beta blockade and 0.8 mg of sl NTG was given. The 3D data set was reconstructed in 5% intervals of the 67-82 % of the R-R cycle. Diastolic phases were analyzed on a dedicated work station using MPR, MIP and VRT modes. The patient received 80 cc of contrast.  Image Quality: Fair, with misregistration artifact.  Aorta: Normal size.  No calcifications.  No dissection.  Aortic Valve:  Trileaflet.  No calcifications.  Coronary Arteries:  Normal coronary origin.  Right dominance.  RCA is a large dominant artery that gives rise to PDA and PLA. The RCA is diffusely calcified. There is a long (27 mm) calcified plaque which starts as a mild (25-49%) in the ostium of the RCA and terminates as a moderate (50-69%) plaque. The mid RCA with a moderate calcified plaque in the mid RCA. Due to multiple stair-step artifact the distal RCA is not well visualized- there is calcified plaque present that can not be accurately quantified.  Left main is a large artery that gives rise to LAD and  LCX arteries.  LAD is a large vessel. The proximal LAD with mild calcified lesions. The mid LAD with 2 separate plaques: There is a focal mild soft plaque and right below this is a long 14.4 mm moderate calcified plaque. The distal LAD with minimal luminal irregularities. D1 with mild calcified plaque in the proximal portion of the vessel.  LCX is a non-dominant artery that gives rise to one  large OM1 branch. There is moderate mixed plaque in the proximal LCX. The mid and distal LCX with no plaques.  Coronary Calcium Score:  Left main: 0  Left anterior descending artery: 540  Left circumflex artery: 49  Right coronary artery: 854  Total: 1443  Percentile: 99  Other findings:  Normal pulmonary vein drainage into the left atrium.  Normal left atrial appendage without a thrombus.  Normal size of the pulmonary artery.  Mild mitral annular calcification  IMPRESSION: 1. Coronary calcium score of 1443. This was 45 percentile for age and sex matched control.  2. Normal coronary origin with right dominance.  3. CAD-RADS 3. Moderate stenosis. Consider symptom-guided anti-ischemic pharmacotherapy as well as risk factor modification per guideline directed care. Additional analysis with CT FFR will be submitted.  The noncardiac portion of this study will be interpreted in separate report by the radiologist.   Electronically Signed By: Thomasene Ripple D.O. On: 12/12/2021 15:04  Narrative EXAM: OVER-READ INTERPRETATION  CT CHEST  The following report is an over-read performed by radiologist Dr. Allegra Lai of [facility] on 12/12/2021. This over-read does not include interpretation of cardiac or coronary anatomy or pathology. The coronary calcium score/coronary CTA interpretation by the cardiologist is attached.  COMPARISON:  Multiple priors, most recent chest CT dated January 25, 2017  FINDINGS: Vascular: Normal heart size. Normal caliber thoracic aorta with moderate atherosclerotic disease. No suspicious filling defects of the visualized pulmonary arteries.  Mediastinum/Nodes: Esophagus is unremarkable. No pathologically enlarged lymph nodes seen in the chest.  Lungs/Pleura: Central airways are patent. Right lower lobe patchy consolidations. Left lower lobe nodule measuring 5 mm on series 11, image 30, unchanged size of compared with  priors dating back to 2015, favored to be benign. Centrilobular emphysema. No evidence of pleural effusion or pneumothorax.  Upper Abdomen: No acute abnormality.  Musculoskeletal: No chest wall mass or suspicious bone lesions identified.  IMPRESSION: 1. Patchy right lower lobe consolidations, likely due to infection or aspiration. Recommend follow-up chest CT in 3 months to ensure resolution. 2. Aortic Atherosclerosis (ICD10-I70.0) and Emphysema (ICD10-J43.9).  Electronically Signed: By: Allegra Lai M.D. On: 12/12/2021 12:14          Results          *** Risk Assessment/Calculations:    {Does this patient have ATRIAL FIBRILLATION?:[phone]}  {This patient may be at risk for Amyloid. She has one or more dx on the problem list or PMH from the following list - Abnormal EKG, CHF, Aortic Stenosis, Proteinuria, LVH, Carpal Tunnel Syndrome, Biceps Tendon Rupture, Syncope. See list below or review PMH.  Diagnoses From Problem List           Noted     Syncope and collapse 11/23/2021    Click HERE to open Cardiac Amyloid Screening SmartSet to order screening OR Click HERE to defer testing for 1 year or permanently :1}    Physical Exam:    VS:  There were no vitals taken for this visit.   Wt Readings from Last 3 Encounters:  07/03/23 124 lb  12.8 oz (56.6 kg)  05/01/23 116 lb 6.4 oz (52.8 kg)  03/13/23 120 lb (54.4 kg)    Gen: *** distress, *** obese/well nourished/malnourished   Neck: No JVD, *** carotid bruit Ears: *** Frank Sign Cardiac: No Rubs or Gallops, *** Murmur, ***cardia, *** radial pulses Respiratory: Clear to auscultation bilaterally, *** effort, ***  respiratory rate GI: Soft, nontender, non-distended *** MS: No *** edema; *** moves all extremities Integument: Skin feels *** Neuro:  At time of evaluation, alert and oriented to person/place/time/situation *** Psych: Normal affect, patient feels ***   ASSESSMENT AND PLAN: .    ***   Riley Lam, MD FASE [facility] Cardiologist [facility]  [address] [place], [address] [place], [place] [address] [phone]  8:57 AM

## 2023-08-20 IMAGING — MG MM DIGITAL SCREENING BILAT W/ TOMO AND CAD
8 series · 9 of 24 positions shown · non-contrast
Comparison: Previous exam(s).

CLINICAL DATA: Screening.

EXAM:
DIGITAL SCREENING BILATERAL MAMMOGRAM WITH TOMOSYNTHESIS AND CAD
TECHNIQUE: Bilateral screening digital craniocaudal and mediolateral oblique
mammograms were obtained. Bilateral screening digital breast
tomosynthesis was performed. The images were evaluated with
computer-aided detection.

[R CC synth-2D]
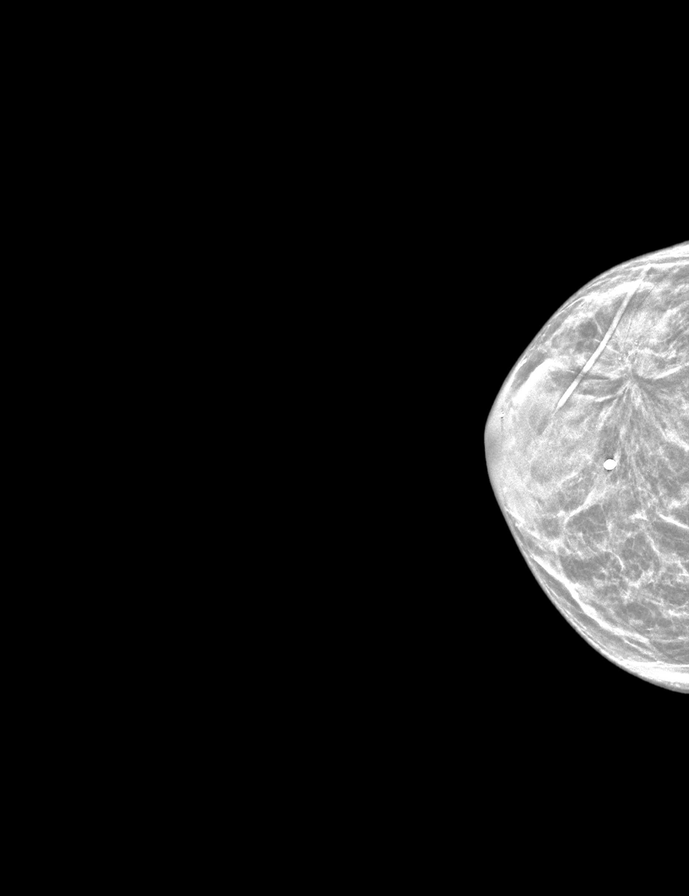

[R MLO synth-2D]
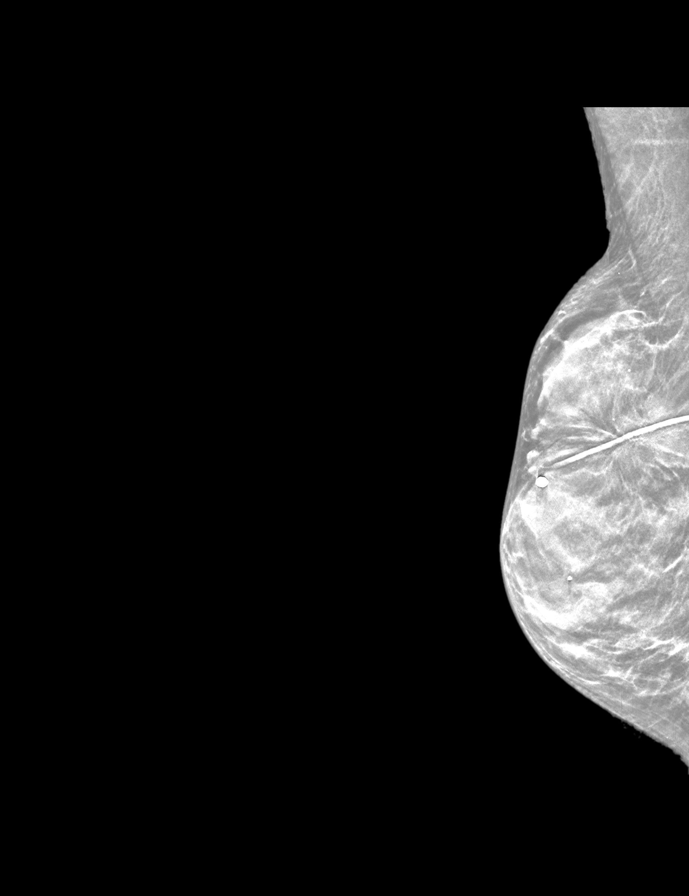

[L CC synth-2D]
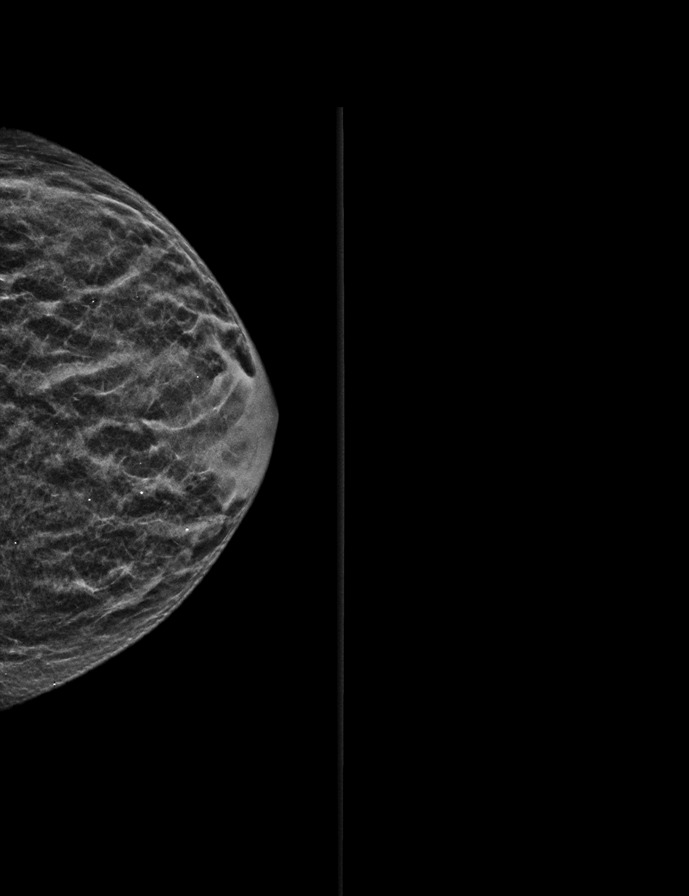

[L MLO synth-2D]
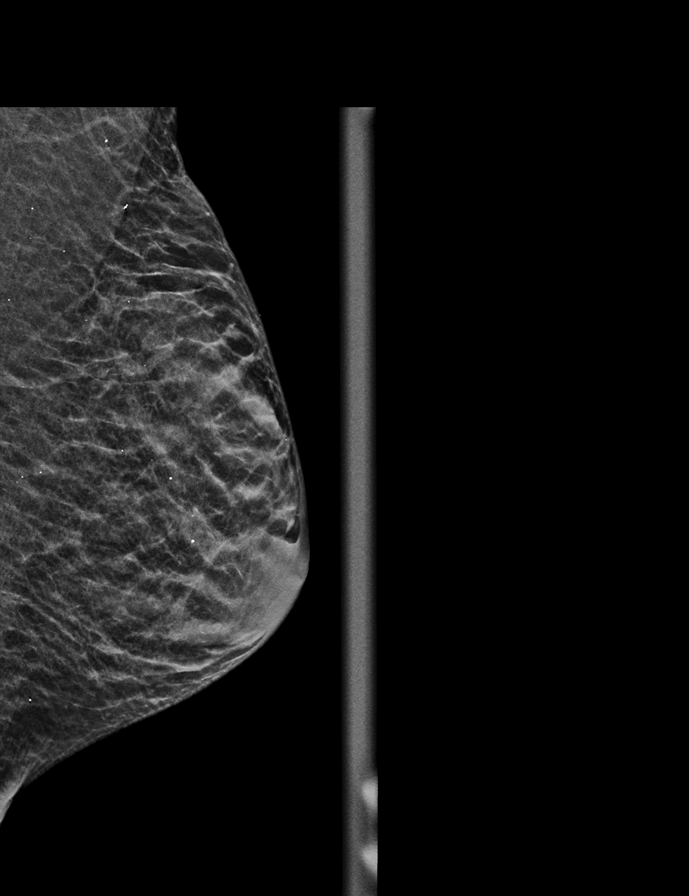

[L MLO tomo · 2 of 30 frames shown]
[frame 10/30]
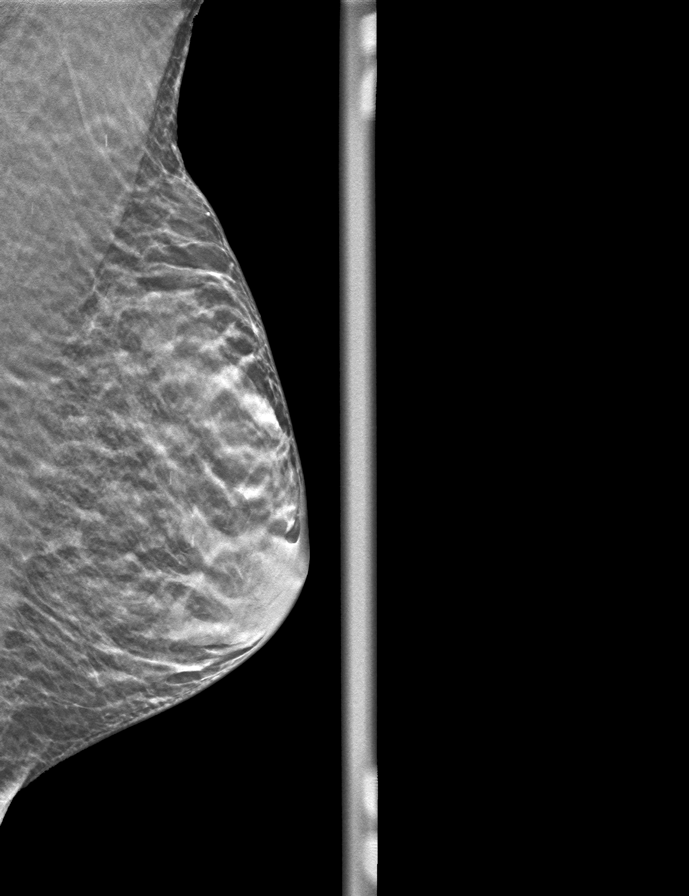
[frame 15/30]
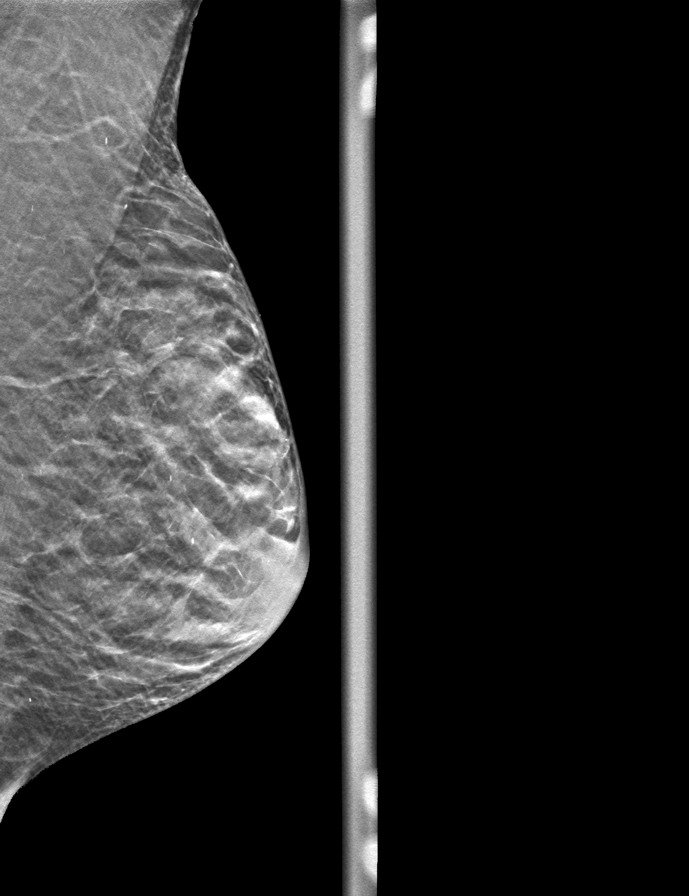

[R CC tomo · tomo slice 15/30.0]
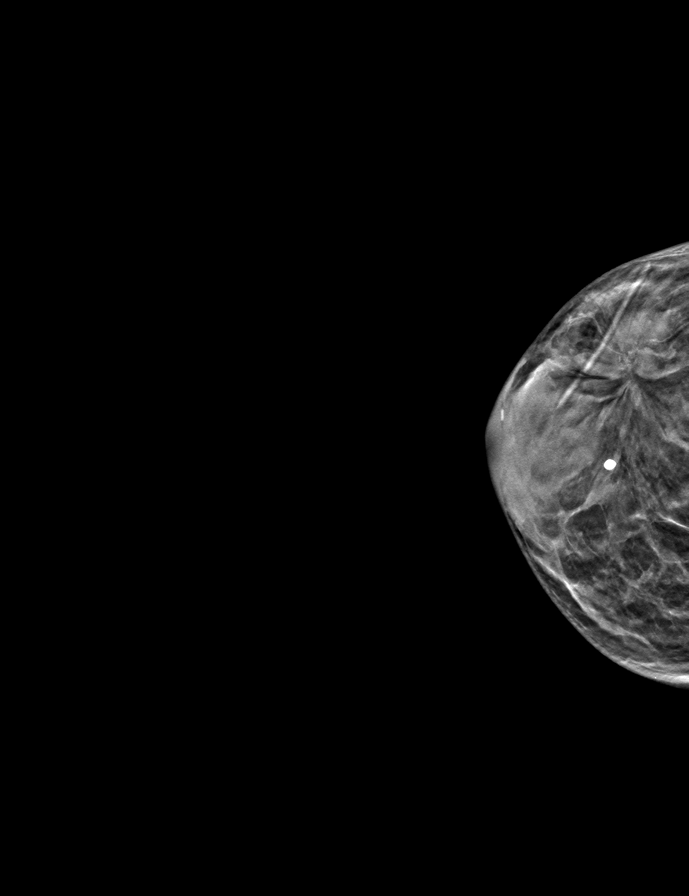

[R MLO tomo · tomo slice 19/36.0]
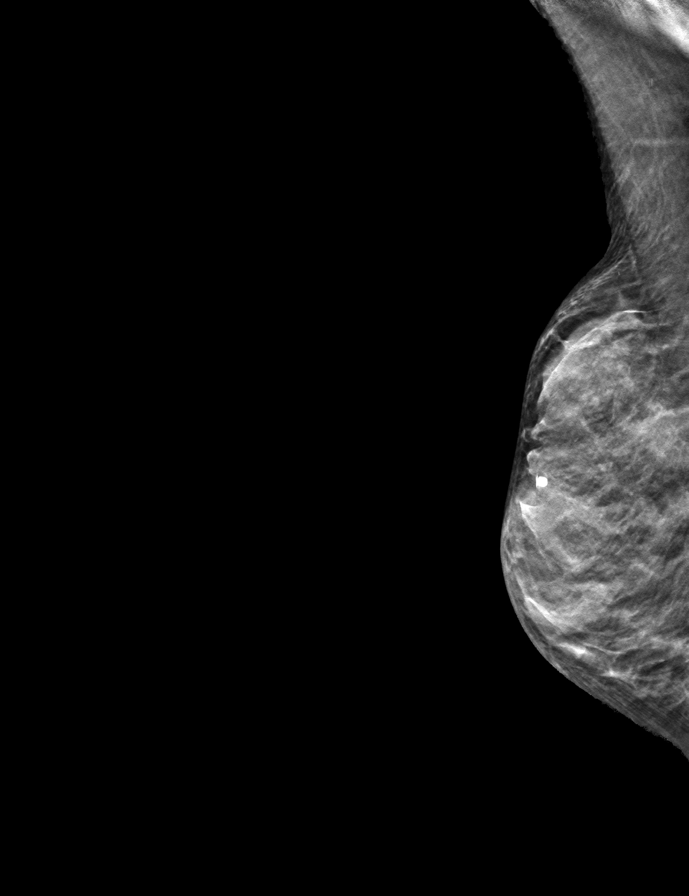

[L CC tomo · tomo slice 15/29.0]
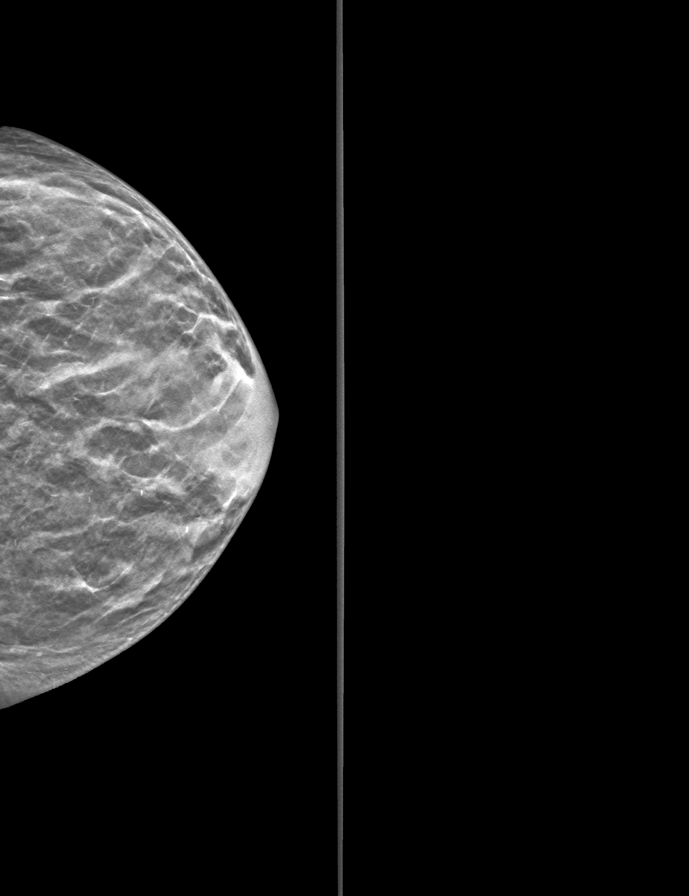

[9 of 24 positions shown; findings below may reference images not displayed]

ACR Breast Density Category c: The breast tissue is heterogeneously
dense, which may obscure small masses.
FINDINGS: There are no findings suspicious for malignancy.
IMPRESSION: No mammographic evidence of malignancy. A result letter of this
screening mammogram will be mailed directly to the patient.

RECOMMENDATION:
Screening mammogram in one year. (Code:Q3-W-BC3)

BI-RADS CATEGORY  1: Negative.

## 2023-09-04 ENCOUNTER — Other Ambulatory Visit: Payer: Self-pay | Admitting: Surgery

## 2023-09-04 ENCOUNTER — Ambulatory Visit: Payer: Self-pay | Admitting: Surgery

## 2023-09-04 ENCOUNTER — Telehealth: Payer: Self-pay | Admitting: Hematology and Oncology

## 2023-09-04 DIAGNOSIS — C50911 Malignant neoplasm of unspecified site of right female breast: Secondary | ICD-10-CM | POA: Diagnosis not present

## 2023-09-04 NOTE — Telephone Encounter (Signed)
 Scheduled appointments per referral. Patient is aware of the appointment time and date as well as the address. Patient was informed to arrive 10-15 minutes prior with updated insurance information. All questions were answered.

## 2023-09-04 NOTE — H&P (Addendum)
 Subjective    Chief Complaint: Breast Cancer       History of Present Illness: Victoria Blair is a 78 y.o. female who is seen today as an office consultation at the request of Dr. Frederik for evaluation of Breast Cancer .   This is a 78 year old female with a history of right breast cancer status postlumpectomy and radiation therapy in 1998 who presents with a recent diagnosis of recurrent right breast cancer.  The patient is accompanied by her niece who states that the patient has begun to develop some memory loss issues.  The patient is status post thyroidectomy as well as cholecystectomy.   Recent screening mammogram revealed a possible mass in the area of her previous lumpectomy.  She underwent further workup that revealed a 1.9 x 1.3 x 1.8 cm hypoechoic irregular mass in the right breast at 10:00 located 4 cm from the nipple.  She underwent biopsy of this area that revealed invasive ductal carcinoma grade 2, ER/PR positive, H ER positive, Ki-67 20%.   Review of Systems: A complete review of systems was obtained from the patient.  I have reviewed this information and discussed as appropriate with the patient.  See HPI as well for other ROS.   Review of Systems  Constitutional:  Positive for weight loss.  HENT: Negative.    Eyes: Negative.   Respiratory: Negative.    Cardiovascular: Negative.   Gastrointestinal: Negative.   Genitourinary: Negative.   Musculoskeletal: Negative.   Skin: Negative.   Neurological: Negative.   Endo/Heme/Allergies: Negative.   Psychiatric/Behavioral:  Positive for memory loss.         Medical History: Past Medical History      Past Medical History:  Diagnosis Date   Anemia     History of cancer     Thyroid  disease          Problem List     Patient Active Problem List  Diagnosis   Invasive ductal carcinoma of breast, female, right (CMS/HHS-HCC)   Aortic atherosclerosis (CMS-HCC)   Aortic insufficiency   Coronary artery disease of  native artery of native heart with stable angina pectoris (CMS-HCC)   Essential hypertension   H/O Graves' disease   Hypothyroidism   Osteoporosis, post-menopausal   Pure hypercholesterolemia   Tobacco abuse        Past Surgical History       Past Surgical History:  Procedure Laterality Date   CESAREAN SECTION       CHOLECYSTECTOMY       THYROIDECTOMY TOTAL            Allergies  No Known Allergies     Medications Ordered Prior to Encounter        Current Outpatient Medications on File Prior to Visit  Medication Sig Dispense Refill   aspirin  81 MG EC tablet Take 81 mg by mouth once daily       EPINEPHrine  (EPIPEN ) 0.3 mg/0.3 mL auto-injector Inject 0.3 mg into the muscle       ibuprofen (MOTRIN) 800 MG tablet Take 800 mg by mouth every 6 (six) hours as needed       levothyroxine  (SYNTHROID ) 88 MCG tablet Take 88 mcg by mouth every morning before breakfast (0630)       nitroGLYcerin  (NITROSTAT ) 0.4 MG SL tablet Place 0.4 mg under the tongue       phenazopyridine  (PYRIDIUM ) 100 MG tablet Take 100 mg by mouth       pravastatin  (PRAVACHOL ) 20 MG tablet  Take 1 tablet by mouth every evening       triamcinolone  0.1 % cream Apply 1 Application topically 2 (two) times daily       valACYclovir  (VALTREX ) 500 MG tablet Take 500 mg by mouth once daily       ascorbic acid, vitamin C, 500 mg Cap 1 tablet Orally Once a day       ezetimibe  (ZETIA ) 10 mg tablet Take 10 mg by mouth once daily       garlic 500 mg Cap Take 1 capsule by mouth once daily       L. acidophilus/Bifid. animalis 32 billion cell Cap Take 1 capsule by mouth once daily       omega-3-dha-epa-fish oil 300-1,000 mg capsule Take 1 g by mouth once daily       yeast,dried, S. cerevisiae, (BREWER'S YEAST ORAL) two tablets Orally once a day        No current facility-administered medications on file prior to visit.        Family History       Family History  Problem Relation Age of Onset   Stroke Mother     High blood  pressure (Hypertension) Father     Hyperlipidemia (Elevated cholesterol) Father     Coronary Artery Disease (Blocked arteries around heart) Father     Alzheimer's disease Sister     Diabetes Brother          Tobacco Use History  Social History        Tobacco Use  Smoking Status Every Day   Types: Cigarettes  Smokeless Tobacco Never        Social History  Social History         Socioeconomic History   Marital status: Unknown  Tobacco Use   Smoking status: Every Day      Types: Cigarettes   Smokeless tobacco: Never  Vaping Use   Vaping status: Never Used  Substance and Sexual Activity   Alcohol use: Never   Drug use: Never    Social Drivers of Health        Housing Stability: Unknown (09/04/2023)    Housing Stability Vital Sign     Homeless in the Last Year: No        Objective:          Vitals:    09/04/23 1634 09/04/23 1635  BP: (!) 142/80    Pulse: 80    Temp: 37 C (98.6 F)    SpO2: 98%    Weight: 58.4 kg (128 lb 11.2 oz)    Height: 170.2 cm (5' 7)    PainSc:   0-No pain    Body mass index is 20.16 kg/m.   Physical Exam    Constitutional:  WDWN in NAD, conversant, no obvious deformities; lying in bed comfortably Eyes:  Pupils equal, round; sclera anicteric; moist conjunctiva; no lid lag HENT:  Oral mucosa moist; good dentition  Neck:  No masses palpated, trachea midline; no thyromegaly Lungs:  CTA bilaterally; normal respiratory effort Breasts:  symmetric, no nipple changes; no palpable masses or lymphadenopathy on left side.  Right breast show a palpable mess (2.5 cm in diameter) in the RUOQ at 10:00.  There is an overlying 1 cm area of skin thickening that the patient states was not present prior to biopsy. CV:  Regular rate and rhythm; no murmurs; extremities well-perfused with no edema Abd:  +bowel sounds, soft, non-tender, no palpable organomegaly; no palpable hernias Musc:  normal  gait; no apparent clubbing or cyanosis in  extremities Lymphatic:  No palpable cervical or axillary lymphadenopathy Skin:  Warm, dry; no sign of jaundice Psychiatric - alert and oriented x 4; calm mood and affect     Labs, Imaging and Diagnostic Testing:   Diagnosis Breast, right, needle core biopsy, 10:00 4 cmfn INVASIVE DUCTAL CARCINOMA DUCTAL CARCINOMA IN SITU, SOLID TYPE, INTERMEDIATE NUCLEAR GRADE TUBULE FORMATION: SCORE 3 NUCLEAR PLEOMORPHISM: SCORE 2 MITOTIC COUNT: SCORE 1 TOTAL SCORE: 6 OVERALL GRADE: 2 LYMPHOVASCULAR INVASION: NOT IDENTIFIED CANCER LENGTH: 7 MM CALCIFICATIONS: NOT IDENTIFIED OTHER FINDINGS: NONE SEE NOTE Diagnosis Note Dr. Reed reviewed the case and concurs with the interpretation. A breast prognostic profile (ER and PR) is pending and will be reported in an addendum. The [facility] was notified on 08/14/2023. Pepper Dutton MD Pathologist, Electronic Signature (Case signed 08/14/2023)   PROGNOSTIC INDICATORS Results: IMMUNOHISTOCHEMICAL AND MORPHOMETRIC ANALYSIS PERFORMED MANUALLY The tumor cells are positive for Her2 (3+). Estrogen Receptor: 95%, POSITIVE, STRONG STAINING INTENSITY Progesterone Receptor: 95%, POSITIVE, STRONG STAINING INTENSITY Proliferation Marker Ki67: 20% REFERENCE RANGE ESTROGEN RECEPTOR NEGATIVE 0% POSITIVE =>1% REFERENCE RANGE PROGESTERONE RECEPTOR NEGATIVE 0% POSITIVE =>1% All controls stained appropriately Zhaoli Lane MD Pathologist, Electronic Signature ( Signed 08/15/2023)   CLINICAL DATA:  Patient recalled from screening for right breast mass and calcifications.   EXAM: DIGITAL DIAGNOSTIC UNILATERAL RIGHT MAMMOGRAM WITH TOMOSYNTHESIS AND CAD; ULTRASOUND RIGHT BREAST LIMITED   TECHNIQUE: Right digital diagnostic mammography and breast tomosynthesis was performed. The images were evaluated with computer-aided detection. ; Targeted ultrasound examination of the right breast was performed   COMPARISON:  Previous exam(s).    ACR Breast Density Category c: The breasts are heterogeneously dense, which may obscure small masses.   FINDINGS: Within the upper-outer right breast near the lumpectomy site there is a spiculated mass with associated punctate calcifications.   Targeted ultrasound is performed, showing a 1.9 x 1.3 x 1.8 cm irregular hypoechoic mass right breast 10 o'clock position 4 cm from nipple at the lumpectomy site. No right axillary adenopathy.   IMPRESSION: Suspicious mass right breast 10 o'clock position 4 cm from nipple at the lumpectomy site. There are associated suspicious calcifications which appear to be localized within the mass on mammography.   RECOMMENDATION: Ultrasound-guided core needle biopsy right breast mass 10 o'clock position 4 cm from nipple.   I have discussed the findings and recommendations with the patient. If applicable, a reminder letter will be sent to the patient regarding the next appointment.   BI-RADS CATEGORY  4: Suspicious.     Electronically Signed   By: Bard Moats M.D.   On: 08/08/2023 14:40   Assessment and Plan:  Diagnoses and all orders for this visit:   Invasive ductal carcinoma of breast, female, right (CMS/HHS-HCC) -     Ambulatory Referral to Oncology-Medical   I had a long discussion with the patient and her family members regarding her recurrent right breast cancer.  We discussed potential treatment options.  She is not a candidate for repeat breast conserving therapy since she has had radiation to that right breast.  Her surgical approach should be a right mastectomy with sentinel lymph node biopsy.  However, the patient is ER/PR/HER2 positive.  It is possible that she would benefit from neoadjuvant treatment which may improve the success of her surgical approach.  There does not seem to be a role for radiation therapy at this time.  We will expedite a urgent referral to oncology.  After they have had their oncology consultation, we will  develop a definitive plan on the timing of surgery.   Remijio Holleran KAI Takima Encina, MD  09/04/2023 5:26 PM

## 2023-09-06 ENCOUNTER — Inpatient Hospital Stay: Payer: Medicare HMO | Attending: Hematology and Oncology | Admitting: Hematology and Oncology

## 2023-09-06 ENCOUNTER — Inpatient Hospital Stay: Payer: Medicare HMO

## 2023-09-06 ENCOUNTER — Telehealth: Payer: Self-pay | Admitting: *Deleted

## 2023-09-06 DIAGNOSIS — M898X9 Other specified disorders of bone, unspecified site: Secondary | ICD-10-CM | POA: Insufficient documentation

## 2023-09-06 DIAGNOSIS — Z79899 Other long term (current) drug therapy: Secondary | ICD-10-CM | POA: Insufficient documentation

## 2023-09-06 DIAGNOSIS — Z170 Estrogen receptor positive status [ER+]: Secondary | ICD-10-CM | POA: Insufficient documentation

## 2023-09-06 DIAGNOSIS — Z923 Personal history of irradiation: Secondary | ICD-10-CM | POA: Insufficient documentation

## 2023-09-06 DIAGNOSIS — F17210 Nicotine dependence, cigarettes, uncomplicated: Secondary | ICD-10-CM | POA: Insufficient documentation

## 2023-09-06 DIAGNOSIS — Z1731 Human epidermal growth factor receptor 2 positive status: Secondary | ICD-10-CM | POA: Insufficient documentation

## 2023-09-06 DIAGNOSIS — Z800 Family history of malignant neoplasm of digestive organs: Secondary | ICD-10-CM | POA: Insufficient documentation

## 2023-09-06 DIAGNOSIS — Z9012 Acquired absence of left breast and nipple: Secondary | ICD-10-CM | POA: Insufficient documentation

## 2023-09-06 DIAGNOSIS — C50411 Malignant neoplasm of upper-outer quadrant of right female breast: Secondary | ICD-10-CM | POA: Insufficient documentation

## 2023-09-06 DIAGNOSIS — Z1721 Progesterone receptor positive status: Secondary | ICD-10-CM | POA: Insufficient documentation

## 2023-09-06 NOTE — Telephone Encounter (Signed)
   Name: Victoria Blair  DOB: Feb 12, 1946  MRN: [id number]  Primary Cardiologist: Victoria DELENA Leavens, MD  Chart reviewed as part of pre-operative protocol coverage. Because of Victoria Blair past medical history and time since last visit, she will require a follow-up in-office visit in order to better assess preoperative cardiovascular risk.  Pre-op covering staff: - Please schedule appointment and call patient to inform them. If patient already had an upcoming appointment within acceptable timeframe, please add pre-op clearance to the appointment notes so provider is aware. - Please contact requesting surgeon's office via preferred method (i.e, phone, fax) to inform them of need for appointment prior to surgery.  Nonobstructive CAD, she can hold ASA x 5-7 day prior to procedure as long as no new symptoms at the time of office visit.   Orren LOISE Fabry, PA-C  09/06/2023, 10:57 AM

## 2023-09-06 NOTE — Assessment & Plan Note (Deleted)
 08/13/2023: History of prior lumpectomy and radiation.  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 After reviewing the pathology in detail, we proceeded to discuss the different treatment options between surgery, radiation, chemotherapy, antiestrogen therapies.  Treatment plan: Neoadjuvant versus adjuvant chemotherapy with Taxol Herceptin Mastectomy Antiestrogen therapy No role of radiation since she had prior radiation therapy  Chemo counseling: I discussed with the patient the risks and benefits of Taxol and Herceptin including the risk of neuropathy, fatigue, nausea, blood count changes, hair loss.  We also discussed the risks of Herceptin including cardiotoxicity's as well as GI issues.  Patient understands these risks and is willing to proceed.

## 2023-09-06 NOTE — Telephone Encounter (Signed)
 Pt has been scheduled to see Edd Fabian, FNP 09/13/23 @ 10:30. Pt will keep her f/u with Dr. Izora Ribas 09/2023.   Will update all parties involved.

## 2023-09-06 NOTE — Telephone Encounter (Signed)
   Pre-operative Risk Assessment    Patient Name: Victoria Blair  DOB: 12/29/1945 MRN: [id number]   Date of last office visit: 03/13/2023 Date of next office visit: 10/21/2023  Request for Surgical Clearance    Procedure:   Mastectomy  Date of Surgery:  Clearance TBD                                 Surgeon:  Dr. Donnice Lima Surgeon's Group or Practice Name:  [facility] Phone number:  [phone] Fax number:  [phone]   Type of Clearance Requested:   - Medical  - Pharmacy:  Hold Aspirin  Not Indicated   Type of Anesthesia:  General    Additional requests/questions:    Signed, Edsel Grayce Sanders   09/06/2023, 10:41 AM

## 2023-09-09 ENCOUNTER — Ambulatory Visit: Payer: Medicare HMO | Admitting: Endocrinology

## 2023-09-10 ENCOUNTER — Encounter: Payer: Self-pay | Admitting: *Deleted

## 2023-09-10 ENCOUNTER — Telehealth: Payer: Self-pay | Admitting: Genetic Counselor

## 2023-09-10 DIAGNOSIS — Z170 Estrogen receptor positive status [ER+]: Secondary | ICD-10-CM

## 2023-09-10 NOTE — Progress Notes (Deleted)
 Cardiology Clinic Note   Patient Name: Victoria Blair Date of Encounter: 09/10/2023  Primary Care Provider:  Joycelyn Rua, MD Primary Cardiologist:  Christell Constant, MD  Patient Profile    Victoria Blair 78 year old female presents the clinic today for preoperative cardiac evaluation and coronary artery disease.  Past Medical History    Past Medical History:  Diagnosis Date   Aortic insufficiency 02/11/2023   TTE 01/23/23: EF 65-70, no RWMA, mod LVH, Gr 1 DD, NL RVSF, NL PASP, RVSP 29.4, mod AI, RAP 3   Breast cancer (HCC) 1997   Depression    GERD (gastroesophageal reflux disease)    Graves disease    Hemorrhoids    Hypertension    Migraines    Osteoporosis    Personal history of radiation therapy 1998   Vitamin D deficiency    Past Surgical History:  Procedure Laterality Date   BREAST BIOPSY Right 08/11/1996   BREAST BIOPSY Right 08/13/2023   [place] RT BREAST BX W LOC DEV 1ST LESION IMG BX SPEC US GUIDE 08/13/2023 [facility]   BREAST LUMPECTOMY Right 1998   CESAREAN SECTION     CHOLECYSTECTOMY     SPINE SURGERY      Allergies  Allergies  Allergen Reactions   Erythromycin Base Other (See Comments) and Anaphylaxis    Other  Other, Other   Hydrocodone-Acetaminophen     Patient passed out with this medication   Hydrocodone-Acetaminophen Nausea And Vomiting    Patient passed out with this medication   Carbamide Peroxide    Lipitor [Atorvastatin]     Myalgias    History of Present Illness    Victoria Blair has a PMH of coronary artery disease, hyperlipidemia, essential hypertension, aortic insufficiency, and tobacco use.  Coronary CTA 12/12/2021 showed coronary calcium score of 1443, ostial RCA 25-49%, medial RCA 50 to 69%, LAD soft plaque, circumflex moderate plaque, FFR normal.  Nuclear stress testing 5/23 showed no ischemia and low risk.  Echocardiogram 5/24 showed an EF of 65-70%, G1 DD, moderate AI.  Her PMH also includes  syncope.  She wore a cardiac event monitor 5/24 which showed an average heart rate of 80, normal sinus rhythm, 1 run of NSVT 6 beats, frequent paroxysmal SVT with the longest lasting 13 seconds which was asymptomatic and isolated PACs and PVCs.  She was previously seen and evaluated by cardiology for coronary artery disease and a syncopal event.  She was noted to be hypotensive and she was treated with IV fluids.  Her lisinopril and metoprolol were discontinued.  On evaluation she had no further episodes of syncope.  She was seen and evaluated by Tereso Newcomer, PA-C on 02/11/2023.  During that time she presented by herself.  She denied further episodes of syncope.  She denied chest pain and shortness of breath.  She denied lower extremity swelling.  She presents to the clinic today for follow-up evaluation and preoperative cardiac evaluation.  She states***.  *** denies chest pain, shortness of breath, lower extremity edema, fatigue, palpitations, melena, hematuria, hemoptysis, diaphoresis, weakness, presyncope, syncope, orthopnea, and PND.  Coronary artery disease-no chest pain today.  Coronary CTA 4/13 showed coronary calcium score of 1443.  Details above.  FFR normal. Continue current medical therapy Heart healthy low-sodium diet Increase physical activity as tolerated  Hyperlipidemia, Aortic atherosclerosis-LDL***. High-fiber diet Continue aspirin, pravastatin, ezetimibe  Syncope and collapse-denies further episodes of lightheadedness, presyncope or syncope Maintain p.o. hydration Change positions slowly  Essential hypertension-BP today***.  Lisinopril and metoprolol discontinued in the setting of hypotension. Maintain blood pressure log Heart healthy low-sodium diet  Disposition: Follow-up with Dr. Izora Ribas or APP in 6 months.  Home Medications    Prior to Admission medications   Medication Sig Start Date End Date Taking? Authorizing Provider  Ascorbic Acid (VITAMIN C) 500 MG  CAPS 1 tablet Orally Once a day    [provider]  aspirin EC 81 MG tablet Take 1 tablet (81 mg total) by mouth daily. Swallow whole. 11/23/21   Christell Constant, MD  Brewers Yeast 487.5 MG TABS two tablets Orally once a day    [provider]  Calcium Carbonate-Vitamin D (CALCIUM + D PO) Take 1 tablet by mouth daily.     [provider]  EPINEPHrine 0.3 mg/0.3 mL IJ SOAJ injection Inject 0.3 mg into the muscle as needed for anaphylaxis. 03/24/23   Particia Nearing, PA-C  ezetimibe (ZETIA) 10 MG tablet TAKE 1 TABLET BY MOUTH EVERY DAY 06/26/23   Chandrasekhar, Mahesh A, MD  fish oil-omega-3 fatty acids 1000 MG capsule Take 1 g by mouth daily.     [provider]  Garlic 500 MG CAPS 1 capsule Orally once a day    [provider]  Homeopathic Products (LEG CRAMPS PO) Take 2 tablets by mouth 2 (two) times daily as needed (leg/hand cramps).    [provider]  ibuprofen (ADVIL) 800 MG tablet Take 800 mg by mouth every 6 (six) hours as needed. 10/23/21   [provider]  levothyroxine (SYNTHROID) 88 MCG tablet Take 1 tablet (88 mcg total) by mouth daily. 05/01/23   Thapa, [place], MD  Multiple Vitamins-Minerals (PRESERVISION AREDS) CAPS 2 capsules Orally once a day    [provider]  nitroGLYCERIN (NITROSTAT) 0.4 MG SL tablet Place 1 tablet (0.4 mg total) under the tongue every 5 (five) minutes as needed for chest pain (may take up to 3 tablets). 12/15/21   Christell Constant, MD  phenazopyridine (PYRIDIUM) 100 MG tablet Take 1 tablet (100 mg total) by mouth 3 (three) times daily as needed for pain. 10/30/22   Valentino Nose, NP  pravastatin (PRAVACHOL) 20 MG tablet TAKE 1 TABLET BY MOUTH EVERY DAY IN THE EVENING 09/18/22   Christell Constant, MD  Probiotic Product (PROBIOTIC DAILY) CAPS Take 1 capsule by mouth daily.    [provider]  sodium fluoride (SODIUM FLUORIDE 5000 PLUS) 1.1 % CREA dental cream as  directed Dental daily    [provider]  triamcinolone cream (KENALOG) 0.1 % Apply 1 Application topically 2 (two) times daily. 03/24/23   Particia Nearing, PA-C  valACYclovir (VALTREX) 500 MG tablet Take 500 mg by mouth daily. 07/30/22   [provider]  VITAMIN E PO Take 1 tablet by mouth daily. Unsure of dose    [provider]    Family History    Family History  Problem Relation Age of Onset   Heart disease Father    Pulmonary fibrosis Father    Hypertension Father    Pancreatic cancer Sister    Diabetes Maternal Aunt    Sleep apnea Son    Breast cancer Neg Hx    She indicated that her mother is deceased. She indicated that her father is deceased. She indicated that her sister is deceased. She indicated that her maternal grandmother is deceased. She indicated that her maternal grandfather is deceased. She indicated that her paternal grandmother is deceased. She indicated that  her paternal grandfather is deceased. She indicated that her son is deceased. She indicated that the status of her maternal aunt is unknown. She indicated that the status of her neg hx is unknown.  Social History    Social History   Socioeconomic History   Marital status: Divorced    Spouse name: Not on file   Number of children: Not on file   Years of education: Not on file   Highest education level: Not on file  Occupational History   Not on file  Tobacco Use   Smoking status: Every Day    Current packs/day: 0.30    Average packs/day: 0.3 packs/day for 40.0 years (12.0 ttl pk-yrs)    Types: Cigarettes   Smokeless tobacco: Never   Tobacco comments:    trying to quit. Pt states she is smoking about 5 cigarettes a day  Substance and Sexual Activity   Alcohol use: Yes    Alcohol/week: 0.0 standard drinks of alcohol    Comment: seldom   Drug use: No   Sexual activity: Yes  Other Topics Concern   Not on file  Social History Narrative   ** Merged History Encounter  **       Social Drivers of Corporate investment banker Strain: Not on file  Food Insecurity: Not on file  Transportation Needs: Not on file  Physical Activity: Not on file  Stress: Not on file  Social Connections: Not on file  Intimate Partner Violence: Not on file     Review of Systems    General:  No chills, fever, night sweats or weight changes.  Cardiovascular:  No chest pain, dyspnea on exertion, edema, orthopnea, palpitations, paroxysmal nocturnal dyspnea. Dermatological: No rash, lesions/masses Respiratory: No cough, dyspnea Urologic: No hematuria, dysuria Abdominal:   No nausea, vomiting, diarrhea, bright red blood per rectum, melena, or hematemesis Neurologic:  No visual changes, wkns, changes in mental status. All other systems reviewed and are otherwise negative except as noted above.  Physical Exam    VS:  There were no vitals taken for this visit. , BMI There is no height or weight on file to calculate BMI. GEN: Well nourished, well developed, in no acute distress. HEENT: normal. Neck: Supple, no JVD, carotid bruits, or masses. Cardiac: RRR, no murmurs, rubs, or gallops. No clubbing, cyanosis, edema.  Radials/DP/PT 2+ and equal bilaterally.  Respiratory:  Respirations regular and unlabored, clear to auscultation bilaterally. GI: Soft, nontender, nondistended, BS + x 4. MS: no deformity or atrophy. Skin: warm and dry, no rash. Neuro:  Strength and sensation are intact. Psych: Normal affect.  Accessory Clinical Findings    Recent Labs: 11/24/2022: ALT 22 12/26/2022: Hemoglobin 11.4; Platelets 245 03/11/2023: BUN 16; Creatinine, Ser 1.03; Potassium 3.9; Sodium 139 06/26/2023: TSH 22.51   Recent Lipid Panel    Component Value Date/Time   CHOL 108 03/27/2022 0743   TRIG 66 03/27/2022 0743   HDL 39 (L) 03/27/2022 0743   CHOLHDL 2.8 03/27/2022 0743   CHOLHDL 4 12/28/2019 0820   VLDL 18.0 12/28/2019 0820   LDLCALC 55 03/27/2022 0743    No BP recorded.   {Refresh Note OR Click here to enter BP  :1}***    ECG personally reviewed by me today- ***     Echocardiogram 01/23/2023  IMPRESSIONS     1. Left ventricular ejection fraction, by estimation, is 65 to 70%. The  left ventricle has normal function. The left ventricle has no regional  wall motion abnormalities. There  is moderate concentric left ventricular  hypertrophy. Left ventricular  diastolic parameters are consistent with Grade I diastolic dysfunction  (impaired relaxation).   2. Right ventricular systolic function is normal. The right ventricular  size is normal. There is normal pulmonary artery systolic pressure. The  estimated right ventricular systolic pressure is 29.4 mmHg.   3. The mitral valve is normal in structure. No evidence of mitral valve  regurgitation. No evidence of mitral stenosis.   4. The aortic valve is tricuspid. There is mild calcification of the  aortic valve. Aortic valve regurgitation is moderate. No aortic stenosis  is present.   5. The inferior vena cava is normal in size with greater than 50%  respiratory variability, suggesting right atrial pressure of 3 mmHg.   FINDINGS   Left Ventricle: Left ventricular ejection fraction, by estimation, is 65  to 70%. The left ventricle has normal function. The left ventricle has no  regional wall motion abnormalities. The left ventricular internal cavity  size was normal in size. There is   moderate concentric left ventricular hypertrophy. Left ventricular  diastolic parameters are consistent with Grade I diastolic dysfunction  (impaired relaxation).   Right Ventricle: The right ventricular size is normal. No increase in  right ventricular wall thickness. Right ventricular systolic function is  normal. There is normal pulmonary artery systolic pressure. The tricuspid  regurgitant velocity is 2.57 m/s, and   with an assumed right atrial pressure of 3 mmHg, the estimated right  ventricular systolic pressure  is 29.4 mmHg.   Left Atrium: Left atrial size was normal in size.   Right Atrium: Right atrial size was normal in size.   Pericardium: There is no evidence of pericardial effusion.   Mitral Valve: The mitral valve is normal in structure. There is mild  calcification of the mitral valve leaflet(s). Mild mitral annular  calcification. No evidence of mitral valve regurgitation. No evidence of  mitral valve stenosis.   Tricuspid Valve: The tricuspid valve is normal in structure. Tricuspid  valve regurgitation is mild.   Aortic Valve: The aortic valve is tricuspid. There is mild calcification  of the aortic valve. Aortic valve regurgitation is moderate. Aortic  regurgitation PHT measures 323 msec. No aortic stenosis is present.   Pulmonic Valve: The pulmonic valve was normal in structure. Pulmonic valve  regurgitation is not visualized.   Aorta: The aortic root is normal in size and structure.   Venous: The inferior vena cava is normal in size with greater than 50%  respiratory variability, suggesting right atrial pressure of 3 mmHg.   IAS/Shunts: No atrial level shunt detected by color flow Doppler.       Assessment & Plan   1.  ***   Preoperative cardiac evaluation-mastectomy, Dr. Manus Rudd, [facility] surgery, fax #[phone]     Primary Cardiologist: Christell Constant, MD  Chart reviewed as part of pre-operative protocol coverage. Given past medical history and time since last visit, based on ACC/AHA guidelines, Victoria Blair would be at acceptable risk for the planned procedure without further cardiovascular testing.   Her RCRI is***  Her aspirin may be held for 5-7 days prior to her procedure.  Please resume aspirin as soon as hemostasis is achieved.  Patient was advised that if he/she*** develops new symptoms prior to surgery to contact our office to arrange a follow-up appointment.  He verbalized understanding.  I will route this  recommendation to the requesting party via Epic fax function and remove from pre-op  pool.      Thomasene Ripple. Korena Nass NP-C     09/10/2023, 8:19 AM [facility] [address] Office [phone] Fax [phone]    I spent***minutes examining this patient, reviewing medications, and using patient centered shared decision making involving their cardiac care.   I spent greater than 20 minutes reviewing their past medical history,  medications, and prior cardiac tests.

## 2023-09-10 NOTE — Telephone Encounter (Signed)
 Scheduled appointments per scheduling message. Patient is aware of the made appointments and will be mailed an appointment reminder.

## 2023-09-11 ENCOUNTER — Other Ambulatory Visit: Payer: Medicare HMO

## 2023-09-11 ENCOUNTER — Encounter: Payer: Self-pay | Admitting: *Deleted

## 2023-09-11 ENCOUNTER — Inpatient Hospital Stay (HOSPITAL_BASED_OUTPATIENT_CLINIC_OR_DEPARTMENT_OTHER): Payer: Medicare HMO | Admitting: Hematology and Oncology

## 2023-09-11 ENCOUNTER — Other Ambulatory Visit: Payer: Self-pay

## 2023-09-11 ENCOUNTER — Encounter: Payer: Self-pay | Admitting: Physical Therapy

## 2023-09-11 ENCOUNTER — Other Ambulatory Visit: Payer: Self-pay | Admitting: *Deleted

## 2023-09-11 ENCOUNTER — Ambulatory Visit: Payer: Medicare HMO | Attending: Hematology and Oncology | Admitting: Physical Therapy

## 2023-09-11 VITALS — BP 205/85 | HR 89 | Temp 97.8°F | Resp 18 | Ht 67.0 in | Wt 133.3 lb

## 2023-09-11 DIAGNOSIS — Z79899 Other long term (current) drug therapy: Secondary | ICD-10-CM | POA: Diagnosis not present

## 2023-09-11 DIAGNOSIS — Z1721 Progesterone receptor positive status: Secondary | ICD-10-CM | POA: Diagnosis not present

## 2023-09-11 DIAGNOSIS — Z170 Estrogen receptor positive status [ER+]: Secondary | ICD-10-CM

## 2023-09-11 DIAGNOSIS — F17210 Nicotine dependence, cigarettes, uncomplicated: Secondary | ICD-10-CM

## 2023-09-11 DIAGNOSIS — M898X9 Other specified disorders of bone, unspecified site: Secondary | ICD-10-CM

## 2023-09-11 DIAGNOSIS — C50411 Malignant neoplasm of upper-outer quadrant of right female breast: Secondary | ICD-10-CM | POA: Insufficient documentation

## 2023-09-11 DIAGNOSIS — Z923 Personal history of irradiation: Secondary | ICD-10-CM | POA: Diagnosis not present

## 2023-09-11 DIAGNOSIS — Z800 Family history of malignant neoplasm of digestive organs: Secondary | ICD-10-CM | POA: Diagnosis not present

## 2023-09-11 DIAGNOSIS — Z853 Personal history of malignant neoplasm of breast: Secondary | ICD-10-CM | POA: Diagnosis not present

## 2023-09-11 DIAGNOSIS — Z9012 Acquired absence of left breast and nipple: Secondary | ICD-10-CM

## 2023-09-11 DIAGNOSIS — R293 Abnormal posture: Secondary | ICD-10-CM | POA: Insufficient documentation

## 2023-09-11 DIAGNOSIS — Z1731 Human epidermal growth factor receptor 2 positive status: Secondary | ICD-10-CM | POA: Diagnosis not present

## 2023-09-11 DIAGNOSIS — R531 Weakness: Secondary | ICD-10-CM

## 2023-09-11 MED ORDER — ANASTROZOLE 1 MG PO TABS: 1.0000 mg | ORAL_TABLET | Freq: Every day | ORAL | 3 refills | Status: DC

## 2023-09-11 NOTE — Progress Notes (Signed)
 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 The patient reports that the recurrence was detected during a routine mammogram and confirmed with subsequent ultrasounds and biopsies. The patient denies needing to take any medication for five years following her initial cancer treatment.  In addition to her cancer history, the patient has experienced episodes of passing out in public places such as [employer] and [employer]. These episodes began approximately a year ago and have occurred sporadically, with the last episode occurring three months ago. The patient reports that she has had a head scan, which did not reveal any abnormalities. The patient's niece suspects that these episodes may be due to dehydration and has been encouraging the patient to drink more water. The patient also tested positive for a urinary infection following one of these episodes.  The patient is generally active and enjoys walking. She lives with her sister and receives regular check-ins from her niece and her son. The patient's niece is her power of attorney.         I reviewed her records extensively and collaborated the history with the patient.  SUMMARY OF ONCOLOGIC HISTORY: Oncology History   No history exists.     MEDICAL HISTORY:  Past Medical History:  Diagnosis Date   Aortic insufficiency 02/11/2023   TTE 01/23/23: EF 65-70, no RWMA, mod LVH, Gr 1 DD, NL RVSF, NL PASP, RVSP 29.4, mod AI, RAP 3   Breast cancer  (HCC) 1997   Depression    GERD (gastroesophageal reflux disease)    Graves disease    Hemorrhoids    Hypertension    Migraines    Osteoporosis    Personal history of radiation therapy 1998   Vitamin D  deficiency     SURGICAL HISTORY: Past Surgical History:  Procedure Laterality Date   BREAST BIOPSY Right 08/11/1996   BREAST BIOPSY Right 08/13/2023   US  RT BREAST BX W LOC DEV 1ST LESION IMG BX SPEC US  GUIDE 08/13/2023 [facility]   BREAST LUMPECTOMY Right 1998   CESAREAN SECTION     CHOLECYSTECTOMY     SPINE SURGERY      SOCIAL HISTORY: Social History   Socioeconomic History   Marital status: Divorced    Spouse name: Not on file   Number of children: Not on file   Years of education: Not on file   Highest education level: Not on file  Occupational History   Not on file  Tobacco Use   Smoking status: Every Day    Current packs/day: 0.30    Average packs/day: 0.3 packs/day for 40.0 years (12.0 ttl pk-yrs)    Types: Cigarettes   Smokeless tobacco: Never   Tobacco comments:    trying to quit. Pt states she is smoking about 5 cigarettes a day  Substance and Sexual Activity   Alcohol use: Yes    Alcohol/week: 0.0 standard drinks of alcohol    Comment: seldom   Drug use: No   Sexual activity: Yes  Other Topics Concern   Not on file  Social History Narrative   ** Merged History Encounter **       Social Drivers of Corporate investment banker Strain: Not on file  Food Insecurity: Not on file  Transportation Needs: Not on file  Physical Activity: Not on file  Stress: Not on file  Social Connections: Not on file  Intimate Partner Violence: Not on file    FAMILY HISTORY: Family History  Problem Relation Age of Onset   Heart disease Father    Pulmonary fibrosis Father    Hypertension Father    Pancreatic cancer Sister    Diabetes Maternal Aunt    Sleep apnea Son    Breast cancer Neg Hx     ALLERGIES:  is allergic to erythromycin base,  hydrocodone -acetaminophen , hydrocodone -acetaminophen , carbamide peroxide, and lipitor [atorvastatin].  MEDICATIONS:  Current Outpatient Medications  Medication Sig Dispense Refill   anastrozole  (ARIMIDEX ) 1 MG tablet Take 1 tablet (1 mg total) by mouth daily. 90 tablet 3   Ascorbic Acid (VITAMIN C) 500 MG CAPS 1 tablet Orally Once a day     aspirin  EC 81 MG tablet Take 1 tablet (81 mg total) by mouth daily. Swallow whole. 30 tablet 11   Brewers Yeast 487.5 MG TABS two tablets Orally once a day     Calcium Carbonate-Vitamin D  (CALCIUM + D PO) Take 1 tablet by mouth daily.      EPINEPHrine  0.3 mg/0.3 mL IJ SOAJ injection Inject 0.3 mg into the muscle as needed for anaphylaxis. 1 each 0   ezetimibe  (ZETIA ) 10 MG tablet TAKE 1 TABLET BY MOUTH EVERY DAY 90 tablet 2   fish oil-omega-3 fatty acids 1000 MG capsule Take 1 g by mouth daily.      Garlic 500 MG CAPS 1 capsule Orally once a day     Homeopathic Products (LEG CRAMPS PO) Take 2 tablets by mouth 2 (two) times daily as needed (leg/hand cramps).     ibuprofen (ADVIL) 800 MG tablet Take 800 mg by mouth every 6 (six) hours as needed.     levothyroxine  (SYNTHROID ) 88 MCG tablet Take 1 tablet (88 mcg total) by mouth daily. 90 tablet 4   Multiple Vitamins-Minerals (PRESERVISION AREDS) CAPS 2 capsules Orally once a day     nitroGLYCERIN  (NITROSTAT ) 0.4 MG SL tablet Place 1 tablet (0.4 mg total) under the tongue every 5 (five) minutes as needed for chest pain (may take up to 3 tablets). 25 tablet 1   phenazopyridine  (PYRIDIUM ) 100 MG tablet Take 1 tablet (100 mg total) by mouth 3 (three) times daily as needed for pain. 10 tablet 0   pravastatin  (PRAVACHOL ) 20 MG tablet TAKE 1 TABLET BY MOUTH EVERY DAY IN THE EVENING 90 tablet 3   Probiotic Product (PROBIOTIC DAILY) CAPS Take 1 capsule by mouth daily.     sodium fluoride  (SODIUM FLUORIDE  5000 PLUS) 1.1 % CREA dental cream as directed Dental daily     triamcinolone  cream (KENALOG ) 0.1 % Apply 1  Application topically 2 (two) times daily. 30 g 0   valACYclovir  (VALTREX ) 500 MG tablet Take 500 mg by mouth daily.     VITAMIN E PO Take 1 tablet by mouth daily. Unsure of dose     No current facility-administered medications for this visit.    REVIEW OF SYSTEMS:   Constitutional: Denies fevers, chills or abnormal night sweats  All other systems were reviewed with the patient and are negative.  PHYSICAL EXAMINATION: ECOG PERFORMANCE STATUS: 1 - Symptomatic but completely ambulatory  Vitals:   09/11/23 1022 09/11/23 1023  BP: (!) 189/108 (!) 205/85  Pulse: 89   Resp: 18   Temp: 97.8 F (36.6 C)   SpO2: 100%    Filed Weights   09/11/23 1022  Weight: 133 lb 4.8 oz (60.5 kg)    GENERAL:alert, no distress and comfortable    LABORATORY DATA:  I have reviewed the data as listed Lab Results  Component Value Date   WBC 7.6 12/26/2022   HGB 11.4 12/26/2022   HCT 35.1 12/26/2022   MCV 95 12/26/2022   PLT 245 12/26/2022   Lab Results  Component Value Date   NA 139 03/11/2023   K 3.9 03/11/2023   CL 103 03/11/2023   CO2 29 03/11/2023    RADIOGRAPHIC STUDIES: I have personally reviewed the radiological reports and agreed with the findings in the report.  ASSESSMENT AND PLAN:  Malignant neoplasm of upper-outer quadrant of right breast in female, estrogen receptor positive (HCC) 08/13/2023: History of prior lumpectomy and radiation.  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 After reviewing the pathology in detail, we proceeded to discuss the different treatment options between surgery,  radiation, chemotherapy, antiestrogen therapies.  Treatment plan: Mastectomy Adjuvant Herceptin (ideally we should treat her with Taxol Herceptin but because of her performance status, I did not recommend Taxol.) Antiestrogen therapy No role of radiation since she had prior radiation therapy We did discuss the pros and cons of chemotherapy with Taxol Herceptin but given her generalized weakness, it might be detrimental to her health to give chemotherapy.  Plan: CT scans: Because of recurrent breast cancer and to evaluate the cause of her pains in the bones. We do not need a port because we will plan to do subcutaneous Herceptin.  All questions were answered. The patient knows to call the clinic with any problems, questions or concerns.    Viinay K Derek Laughter, MD 09/11/23

## 2023-09-11 NOTE — Therapy (Signed)
 OUTPATIENT PHYSICAL THERAPY BREAST CANCER BASELINE EVALUATION   Patient Name: Victoria Blair MRN: [id number] DOB:09-29-1945, 78 y.o., female Today's Date: 09/11/2023  END OF SESSION:  PT End of Session - 09/11/23 1433     Visit Number 1    Number of Visits 2    Date for PT Re-Evaluation 11/06/23    PT Start Time 1055    PT Stop Time 1133    PT Time Calculation (min) 38 min    Activity Tolerance Patient tolerated treatment well    Behavior During Therapy WFL for tasks assessed/performed             Past Medical History:  Diagnosis Date   Aortic insufficiency 02/11/2023   TTE 01/23/23: EF 65-70, no RWMA, mod LVH, Gr 1 DD, NL RVSF, NL PASP, RVSP 29.4, mod AI, RAP 3   Breast cancer (HCC) 1997   Depression    GERD (gastroesophageal reflux disease)    Graves disease    Hemorrhoids    Hypertension    Migraines    Osteoporosis    Personal history of radiation therapy 1998   Vitamin D  deficiency    Past Surgical History:  Procedure Laterality Date   BREAST BIOPSY Right 08/11/1996   BREAST BIOPSY Right 08/13/2023   US  RT BREAST BX W LOC DEV 1ST LESION IMG BX SPEC US  GUIDE 08/13/2023 [facility]   BREAST LUMPECTOMY Right 1998   CESAREAN SECTION     CHOLECYSTECTOMY     SPINE SURGERY     Patient Active Problem List   Diagnosis Date Noted   Malignant neoplasm of upper-outer quadrant of right breast in female, estrogen receptor positive (HCC) 09/06/2023   Vitamin D  deficiency 05/01/2023   H/O Graves' disease 05/01/2023   Aortic insufficiency 02/11/2023   Osteoporosis, post-menopausal 09/12/2022   Myalgia due to statin 06/28/2022   Syncope and collapse 11/23/2021   Aortic atherosclerosis (HCC) 11/23/2021   Coronary artery disease of native artery of native heart with stable angina pectoris (HCC) 11/23/2021   Lung nodule 12/31/2013   Tobacco abuse 12/31/2013   Essential hypertension 09/03/2013   Hypothyroidism, postradioiodine therapy 05/26/2013   Pure  hypercholesterolemia 05/26/2013   DEPRESSION 04/26/2007   Osteopenia 04/26/2007   BREAST CANCER, HX OF 04/26/2007    REFERRING PROVIDER: Dr. Cameron Cea  REFERRING DIAG: Right breast cancer  THERAPY DIAG:  Malignant neoplasm of upper-outer quadrant of right breast in female, estrogen receptor positive (HCC)  Abnormal posture  Rationale for Evaluation and Treatment: Rehabilitation  ONSET DATE: 08/13/2023  SUBJECTIVE:  SUBJECTIVE STATEMENT: Patient reports she is here today to be seen by her medical team for her newly diagnosed right breast cancer.   PERTINENT HISTORY:  Patient was diagnosed on 08/13/2023 with right grade 2 invasive ductal carcinoma breast cancer. It measures 1.9 cm and is located in the upper outer quadrant. It is triple positive with a Ki67 of 20%. She has a history of right breast cancer in 1998 with a lumpectomy and radiation.   PATIENT GOALS:   reduce lymphedema risk and learn post op HEP.   PAIN:  Are you having pain? No  PRECAUTIONS: Active CA   RED FLAGS: None   HAND DOMINANCE: right  WEIGHT BEARING RESTRICTIONS: No  FALLS:  Has patient fallen in last 6 months? Yes. Number of falls 3 - due to syncope episodes; pt's niece reports it is due to  dehydration and a UTI  LIVING ENVIRONMENT: Patient lives with: her sister Lives in: House/apartment Has following equipment at home: None (would probably benefit from a cane)  OCCUPATION: Retired  LEISURE: She does not exercise  PRIOR LEVEL OF FUNCTION: Independent with basic ADLs   OBJECTIVE: Note: Objective measures were completed at Evaluation unless otherwise noted.  COGNITION: Overall cognitive status: Within functional limits for tasks assessed but would benefit from further testing    POSTURE:  Forward  head and rounded shoulders posture  UPPER EXTREMITY AROM/PROM:  A/PROM RIGHT   eval   Shoulder extension 42  Shoulder flexion 136  Shoulder abduction 137  Shoulder internal rotation 72  Shoulder external rotation 70    (Blank rows = not tested)  A/PROM LEFT   eval  Shoulder extension 48  Shoulder flexion 131  Shoulder abduction 131  Shoulder internal rotation 76  Shoulder external rotation 78    (Blank rows = not tested)  CERVICAL AROM: All within normal limits  UPPER EXTREMITY STRENGTH: WFL  LYMPHEDEMA ASSESSMENTS (in cm):   LANDMARK RIGHT   eval  10 cm proximal to olecranon process 23.5  Olecranon process 21.8  10 cm proximal to ulnar styloid process 17.2  Just proximal to ulnar styloid process 13.5  Across hand at thumb web space 17  At base of 2nd digit 5.7  (Blank rows = not tested)  LANDMARK LEFT   eval  10 cm proximal to olecranon process 25.2  Olecranon process 22.8  10 cm proximal to ulnar styloid process 17.1  Just proximal to ulnar styloid process 13.9  Across hand at thumb web space 16.8  At base of 2nd digit 5.5  (Blank rows = not tested)  L-DEX LYMPHEDEMA SCREENING:  The patient was assessed using the L-Dex machine today to produce a lymphedema index baseline score. The patient will be reassessed on a regular basis (typically every 3 months) to obtain new L-Dex scores. If the score is > 6.5 points away from his/her baseline score indicating onset of subclinical lymphedema, it will be recommended to wear a compression garment for 4 weeks, 12 hours per day and then be reassessed. If the score continues to be > 6.5 points from baseline at reassessment, we will initiate lymphedema treatment. Assessing in this manner has a 95% rate of preventing clinically significant lymphedema.   L-DEX FLOWSHEETS - 09/11/23 1400       L-DEX LYMPHEDEMA SCREENING   Measurement Type Unilateral    L-DEX MEASUREMENT EXTREMITY Upper Extremity    POSITION  Standing     DOMINANT SIDE Right    At Risk Side Right  BASELINE SCORE (UNILATERAL) -16   Did test twice as pt's score was out of normal limits but both ties the score was out of normal limits            PATIENT EDUCATION:  Education details: Lymphedema risk reduction and post op shoulder/posture HEP Person educated: Patient Education method: Programmer, multimedia, Demonstration, Handout Education comprehension: Patient verbalized understanding and returned demonstration  HOME EXERCISE PROGRAM: Patient was instructed today in a home exercise program today for post op shoulder range of motion. These included active assist shoulder flexion in sitting, scapular retraction, wall walking with shoulder abduction, and hands behind head external rotation.  She was encouraged to do these twice a day, holding 3 seconds and repeating 5 times when permitted by her physician.   ASSESSMENT:  CLINICAL IMPRESSION: Patient was diagnosed on 08/13/2023 with right grade 2 invasive ductal carcinoma breast cancer. It measures 1.9 cm and is located in the upper outer quadrant. It is triple positive with a Ki67 of 20%. She has a history of right breast cancer in 1998 with a lumpectomy and radiation. Her multidisciplinary medical team met prior to her assessments to determine a recommended treatment plan. She is planning to have a right mastectomy and sentinel node biopsy followed by chemotherapy and anti-estrogen therapy. She will benefit from a post op PT reassessment to determine needs and from L-Dex screens every 3 months for 2 years to detect subclinical lymphedema. She will benefit from PT to address general weakness and balance. She is at risk for a decline with chemotherapy if she does not have strength deficits addressed.  Pt will benefit from skilled therapeutic intervention to improve on the following deficits: Decreased knowledge of precautions, impaired UE functional use, pain, decreased ROM, postural dysfunction.   PT  treatment/interventions: ADL/self-care home management, pt/family education, therapeutic exercise  REHAB POTENTIAL: Good  CLINICAL DECISION MAKING: Stable/uncomplicated  EVALUATION COMPLEXITY: Low   GOALS: Goals reviewed with patient? YES  LONG TERM GOALS: (STG=LTG)    Name Target Date Goal status  1 Pt will be able to verbalize understanding of pertinent lymphedema risk reduction practices relevant to her dx specifically related to skin care.  Baseline:  No knowledge 09/11/2023 Achieved at eval  2 Pt will be able to return demo and/or verbalize understanding of the post op HEP related to regaining shoulder ROM. Baseline:  No knowledge 09/11/2023 Achieved at eval  3 Pt will be able to verbalize understanding of the importance of attending the post op After Breast CA Class for further lymphedema risk reduction education and therapeutic exercise.  Baseline:  No knowledge 09/11/2023 Achieved at eval  4 Pt will demo she has regained full shoulder ROM and function post operatively compared to baselines.  Baseline: See objective measurements taken today. 11/06/2023     PLAN:  PT FREQUENCY/DURATION: EVAL and 1 follow up appointment.   PLAN FOR NEXT SESSION: will reassess 3-4 weeks post op to determine needs.   Patient will follow up at outpatient cancer rehab 3-4 weeks following surgery.  If the patient requires physical therapy at that time, a specific plan will be dictated and sent to the referring physician for approval. The patient was educated today on appropriate basic range of motion exercises to begin post operatively and the importance of attending the After Breast Cancer class following surgery.  Patient was educated today on lymphedema risk reduction practices as it pertains to recommendations that will benefit the patient immediately following surgery.  She verbalized good understanding.  Physical Therapy Information for After Breast Cancer Surgery/Treatment:  Lymphedema is a  swelling condition that you may be at risk for in your arm if you have lymph nodes removed from the armpit area.  After a sentinel node biopsy, the risk is approximately 5-9% and is higher after an axillary node dissection.  There is treatment available for this condition and it is not life-threatening.  Contact your physician or physical therapist with concerns. You may begin the 4 shoulder/posture exercises (see additional sheet) when permitted by your physician (typically a week after surgery).  If you have drains, you may need to wait until those are removed before beginning range of motion exercises.  A general recommendation is to not lift your arms above shoulder height until drains are removed.  These exercises should be done to your tolerance and gently.  This is not a "no pain/no gain" type of recovery so listen to your body and stretch into the range of motion that you can tolerate, stopping if you have pain.  If you are having immediate reconstruction, ask your plastic surgeon about doing exercises as he or she may want you to wait. We encourage you to attend the free one time ABC (After Breast Cancer) class offered by [facility].  You will learn information related to lymphedema risk, prevention and treatment and additional exercises to regain mobility following surgery.  You can call [phone] for more information.  This is offered the 1st and 3rd Monday of each month.  You only attend the class one time. While undergoing any medical procedure or treatment, try to avoid blood pressure being taken or needle sticks from occurring on the arm on the side of cancer.   This recommendation begins after surgery and continues for the rest of your life.  This may help reduce your risk of getting lymphedema (swelling in your arm). An excellent resource for those seeking information on lymphedema is the National Lymphedema Network's web site. It can be accessed at  www.lymphnet.org If you notice swelling in your hand, arm or breast at any time following surgery (even if it is many years from now), please contact your doctor or physical therapist to discuss this.  Lymphedema can be treated at any time but it is easier for you if it is treated early on.  If you feel like your shoulder motion is not returning to normal in a reasonable amount of time, please contact your surgeon or physical therapist.  [facility] [phone]. [address], [place] [address]  ABC CLASS After Breast Cancer Class  After Breast Cancer Class is a specially designed exercise class to assist you in a safe recover after having breast cancer surgery.  In this class you will learn how to get back to full function whether your drains were just removed or if you had surgery a month ago.  This one-time class is held the 1st and 3rd Monday of every month from 11:00 a.m. until 12:00 noon virtually.  This class is FREE and space is limited. For more information or to register for the next available class, call [phone].  Class Goals  Understand specific stretches to improve the flexibility of you chest and shoulder. Learn ways to safely strengthen your upper body and improve your posture. Understand the warning signs of infection and why you may be at risk for an arm infection. Learn about Lymphedema and prevention.  ** You do not attend  this class until after surgery.  Drains must be removed to participate  Patient was instructed today in a home exercise program today for post op shoulder range of motion. These included active assist shoulder flexion in sitting, scapular retraction, wall walking with shoulder abduction, and hands behind head external rotation.  She was encouraged to do these twice a day, holding 3 seconds and repeating 5 times when permitted by her physician.   Rollin Clock, [place] 09/11/23 3:07 PM

## 2023-09-11 NOTE — Assessment & Plan Note (Signed)
 08/13/2023: History of prior lumpectomy and radiation.  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 After reviewing the pathology in detail, we proceeded to discuss the different treatment options between surgery, radiation, chemotherapy, antiestrogen therapies.  Treatment plan: Neoadjuvant versus adjuvant chemotherapy with Taxol Herceptin Mastectomy Antiestrogen therapy No role of radiation since she had prior radiation therapy  Chemo counseling: I discussed with the patient the risks and benefits of Taxol and Herceptin including the risk of neuropathy, fatigue, nausea, blood count changes, hair loss.  We also discussed the risks of Herceptin including cardiotoxicity's as well as GI issues.  Patient understands these risks and is willing to proceed.

## 2023-09-13 ENCOUNTER — Ambulatory Visit: Payer: Medicare HMO | Attending: General Practice | Admitting: General Practice

## 2023-09-14 ENCOUNTER — Telehealth: Payer: Self-pay | Admitting: Hematology and Oncology

## 2023-09-14 NOTE — Telephone Encounter (Signed)
Called and left message of future Survivorship Plan appointment scheduled. Provide office number if rescheduling is needed.

## 2023-09-17 ENCOUNTER — Ambulatory Visit: Payer: Medicare HMO

## 2023-09-18 ENCOUNTER — Ambulatory Visit: Payer: Medicare HMO

## 2023-09-19 ENCOUNTER — Encounter: Payer: Self-pay | Admitting: *Deleted

## 2023-09-19 ENCOUNTER — Inpatient Hospital Stay: Payer: Medicare HMO | Admitting: Licensed Clinical Social Worker

## 2023-09-19 DIAGNOSIS — R300 Dysuria: Secondary | ICD-10-CM | POA: Diagnosis not present

## 2023-09-19 NOTE — Progress Notes (Signed)
[facility] Clinical Social Work  Clinical Social Work was referred by new patient protocol for assessment of psychosocial needs.  Clinical Social Worker contacted patient by phone to offer support and assess for needs.   Patient reports doing well right now. She lives with her sister and her sister assists with rides due to episodes of passing out. She also has support from her son and niece. Pt shared some of her life story. She has a strong sense of humor, connection to her spirituality, and love of knitting/sewing.  She is familiar with [facility] programs and will continue to take part in them.  CSW provided direct contact information and encouraged pt to call if any support needs arise.     Julio Zappia E Nyjah Schwake, LCSW  Clinical Social Worker [facility]

## 2023-09-25 ENCOUNTER — Encounter: Payer: Self-pay | Admitting: Endocrinology

## 2023-09-25 ENCOUNTER — Ambulatory Visit (INDEPENDENT_AMBULATORY_CARE_PROVIDER_SITE_OTHER): Payer: Medicare HMO | Admitting: Endocrinology

## 2023-09-25 VITALS — BP 172/82 | HR 83 | Wt 130.2 lb

## 2023-09-25 DIAGNOSIS — E559 Vitamin D deficiency, unspecified: Secondary | ICD-10-CM

## 2023-09-25 DIAGNOSIS — Z8639 Personal history of other endocrine, nutritional and metabolic disease: Secondary | ICD-10-CM | POA: Diagnosis not present

## 2023-09-25 DIAGNOSIS — E890 Postprocedural hypothyroidism: Secondary | ICD-10-CM

## 2023-09-25 DIAGNOSIS — M810 Age-related osteoporosis without current pathological fracture: Secondary | ICD-10-CM | POA: Diagnosis not present

## 2023-09-25 NOTE — Progress Notes (Signed)
Outpatient Endocrinology Note [place] Fahima Cifelli, MD  09/25/23  Patient's Name: Victoria Blair    DOB: [birth year]    MRN: [id number]  REASON OF VISIT: Follow-up for hypothyroidism /osteoporosis.  PCP: Joycelyn Rua, MD  HISTORY OF PRESENT ILLNESS:   Victoria Blair is a 78 y.o. old female with past medical history as listed below is presented for a follow up of hypothyroidism /osteoporosis/vitamin D deficiency.   Pertinent History: # Hypothyroidism -Patient has postablative hypothyroidism was diagnosed in 1978 after radioactive iodine I-131 ablation for Graves' disease.  She has been on thyroid hormone replacement/levothyroxine and dose had been adjusted periodically.  # Osteoporosis Patient has osteoporosis T-score at the spine was -2.8 in October 2018.  She was previously given Actonel but he stopped because of difficulty with compliance.  She had taken Evista but did not take it regularly, started in October 2019, was subsequently stopped due to cost. T-score at the spine was -3.1 and declined by 4.6% in November 2020. -She was started on Reclast 5 mg IV infusion annually.  First dose was in December 2020.  Last Prolia injection was in January 2024.  -DEXA scan in November 2022 with lowest T-score of -2.7 at the spine, improvement on bone density at the spine and some decline in the hips. -She has been taking calcium and vitamin D supplement.  Vitamin D level is 32.69 in January 2024.  Results:   Lumbar spine L1-L4 Femoral neck (FN) 33% distal radius  T-score -2.7 RFN: -1.9 LFN: -2.6 n/a  Change in BMD from previous DXA test (%) Up 5.9% Down 5.3% n/a  (*) statistically significant   Assessment: Patient has OSTEOPOROSIS according to the [facility] classification for osteoporosis (see below).   Interval history  Patient presented for follow-up.  Brought the medication bottle and she is actually taking levothyroxine 100 MCG daily.  Patient did not have a lab as planned in the last  visit in our clinic however patient had lab work with primary care provider last week when she went for evaluation for UTI.  She is currently on antibiotic for UTI as well.  No lab results or records available to review.  Patient denies palpitation or heat intolerance.  No constipation.  Overall fair energy.  She reports compliance with levothyroxine now has pill pack and taking every day.  She has newly diagnosed breast cancer and planning for surgery next month.  Patient is accompanied by her family member in the clinic today.  REVIEW OF SYSTEMS:  As per history of present illness.   PAST MEDICAL HISTORY: Past Medical History:  Diagnosis Date   Aortic insufficiency 02/11/2023   TTE 01/23/23: EF 65-70, no RWMA, mod LVH, Gr 1 DD, NL RVSF, NL PASP, RVSP 29.4, mod AI, RAP 3   Breast cancer (HCC) 1997   Depression    GERD (gastroesophageal reflux disease)    Graves disease    Hemorrhoids    Hypertension    Migraines    Osteoporosis    Personal history of radiation therapy 1998   Vitamin D deficiency     PAST SURGICAL HISTORY: Past Surgical History:  Procedure Laterality Date   BREAST BIOPSY Right 08/11/1996   BREAST BIOPSY Right 08/13/2023   [place] RT BREAST BX W LOC DEV 1ST LESION IMG BX SPEC US GUIDE 08/13/2023 [facility]   BREAST LUMPECTOMY Right 1998   CESAREAN SECTION     CHOLECYSTECTOMY     SPINE SURGERY      ALLERGIES: Allergies  Allergen Reactions   Erythromycin Base Other (See Comments) and Anaphylaxis    Other  Other, Other   Hydrocodone-Acetaminophen     Patient passed out with this medication   Hydrocodone-Acetaminophen Nausea And Vomiting    Patient passed out with this medication   Carbamide Peroxide    Lipitor [Atorvastatin]     Myalgias    FAMILY HISTORY:  Family History  Problem Relation Age of Onset   Heart disease Father    Pulmonary fibrosis Father    Hypertension Father    Pancreatic cancer Sister    Diabetes Maternal Aunt    Sleep  apnea Son    Breast cancer Neg Hx     SOCIAL HISTORY: Social History   Socioeconomic History   Marital status: Divorced    Spouse name: Not on file   Number of children: Not on file   Years of education: Not on file   Highest education level: Not on file  Occupational History   Not on file  Tobacco Use   Smoking status: Every Day    Current packs/day: 0.30    Average packs/day: 0.3 packs/day for 40.0 years (12.0 ttl pk-yrs)    Types: Cigarettes   Smokeless tobacco: Never   Tobacco comments:    trying to quit. Pt states she is smoking about 5 cigarettes a day  Substance and Sexual Activity   Alcohol use: Yes    Alcohol/week: 0.0 standard drinks of alcohol    Comment: seldom   Drug use: No   Sexual activity: Yes  Other Topics Concern   Not on file  Social History Narrative   ** Merged History Encounter **       Social Drivers of Health   Financial Resource Strain: Not on file  Food Insecurity: No Food Insecurity (09/19/2023)   Hunger Vital Sign    Worried About Running Out of Food in the Last Year: Never true    Ran Out of Food in the Last Year: Never true  Transportation Needs: No Transportation Needs (09/19/2023)   PRAPARE - Administrator, Civil Service (Medical): No    Lack of Transportation (Non-Medical): No  Physical Activity: Not on file  Stress: Not on file  Social Connections: Not on file    MEDICATIONS:  Current Outpatient Medications  Medication Sig Dispense Refill   anastrozole (ARIMIDEX) 1 MG tablet Take 1 tablet (1 mg total) by mouth daily. 90 tablet 3   Ascorbic Acid (VITAMIN C) 500 MG CAPS 1 tablet Orally Once a day     aspirin EC 81 MG tablet Take 1 tablet (81 mg total) by mouth daily. Swallow whole. 30 tablet 11   Brewers Yeast 487.5 MG TABS two tablets Orally once a day     Calcium Carbonate-Vitamin D (CALCIUM + D PO) Take 1 tablet by mouth daily.      EPINEPHrine 0.3 mg/0.3 mL IJ SOAJ injection Inject 0.3 mg into the muscle as  needed for anaphylaxis. 1 each 0   ezetimibe (ZETIA) 10 MG tablet TAKE 1 TABLET BY MOUTH EVERY DAY 90 tablet 2   fish oil-omega-3 fatty acids 1000 MG capsule Take 1 g by mouth daily.      Garlic 500 MG CAPS 1 capsule Orally once a day     Homeopathic Products (LEG CRAMPS PO) Take 2 tablets by mouth 2 (two) times daily as needed (leg/hand cramps).     ibuprofen (ADVIL) 800 MG tablet Take 800 mg by mouth every 6 (six)  hours as needed.     levothyroxine (SYNTHROID) 88 MCG tablet Take 1 tablet (88 mcg total) by mouth daily. 90 tablet 4   Multiple Vitamins-Minerals (PRESERVISION AREDS) CAPS 2 capsules Orally once a day     nitroGLYCERIN (NITROSTAT) 0.4 MG SL tablet Place 1 tablet (0.4 mg total) under the tongue every 5 (five) minutes as needed for chest pain (may take up to 3 tablets). 25 tablet 1   phenazopyridine (PYRIDIUM) 100 MG tablet Take 1 tablet (100 mg total) by mouth 3 (three) times daily as needed for pain. 10 tablet 0   pravastatin (PRAVACHOL) 20 MG tablet TAKE 1 TABLET BY MOUTH EVERY DAY IN THE EVENING 90 tablet 3   Probiotic Product (PROBIOTIC DAILY) CAPS Take 1 capsule by mouth daily.     sodium fluoride (SODIUM FLUORIDE 5000 PLUS) 1.1 % CREA dental cream as directed Dental daily     triamcinolone cream (KENALOG) 0.1 % Apply 1 Application topically 2 (two) times daily. 30 g 0   valACYclovir (VALTREX) 500 MG tablet Take 500 mg by mouth daily.     VITAMIN E PO Take 1 tablet by mouth daily. Unsure of dose     No current facility-administered medications for this visit.    PHYSICAL EXAM: Vitals:   09/25/23 1501  BP: (!) 172/82  Pulse: 83  SpO2: 98%  Weight: 130 lb 3.2 oz (59.1 kg)   Body mass index is 20.39 kg/m.  Wt Readings from Last 3 Encounters:  09/25/23 130 lb 3.2 oz (59.1 kg)  09/11/23 133 lb 4.8 oz (60.5 kg)  07/03/23 124 lb 12.8 oz (56.6 kg)     General: Well developed, well nourished female in no apparent distress.  HEENT: AT/[place], no external lesions. Hearing intact  to the spoken word Eyes: Conjunctiva clear and no icterus. Abdomen: Soft, non tender, non distended Neurologic: Alert, oriented, normal speech, deep tendon biceps reflexes normal,  no gross focal neurological deficit Extremities: No pedal pitting edema, no tremors of outstretched hands Skin: Warm, color good.  Psychiatric: Does not appear depressed or anxious  PERTINENT HISTORIC LABORATORY AND IMAGING STUDIES:  All pertinent laboratory results were reviewed. Please see HPI also for further details.   TSH  Date Value Ref Range Status  06/26/2023 22.51 (H) 0.35 - 5.50 uIU/mL Final  04/24/2023 0.16 (L) 0.35 - 5.50 uIU/mL Final  03/11/2023 21.57 (H) 0.35 - 5.50 uIU/mL Final     ASSESSMENT / PLAN  1. Hypothyroidism, postradioiodine therapy   2. Osteoporosis, post-menopausal   3. H/O Graves' disease   4. Vitamin D deficiency      -She has postablative hypothyroidism.  Currently taking levothyroxine 100 mcg daily.  Patient had lab with primary care provider in outside clinic, no records available to review.  Used to be confusion with the dose of levothyroxine and compliance in the past.  Plan: -Asked to fax recent thyroid function test result done at primary care provider to our clinic.  Will adjust dose of levothyroxine based on test result. -Currently on levothyroxine 100 mcg daily.  # Osteoporosis. -Last DEXA scan in November 2022, consistent with osteoporosis with lowest T-score of -2.7 at lumbar spine. -She is currently on Reclast 5 mg IV infusion annually.  Last Reclast was in January 2024. -Next dose of Reclast infusion in January /February 2025. -Continue current dose of calcium and vitamin D supplement.  Discussed fall precautions.  Weightbearing exercise as tolerated.  # Patient has newly diagnosed breast cancer, possible surgery next month, following  with oncology.   Diagnoses and all orders for this visit:  Hypothyroidism, postradioiodine therapy  Osteoporosis,  post-menopausal  H/O Graves' disease  Vitamin D deficiency    DISPOSITION Follow up in clinic in 3 months suggested.   All questions answered and patient verbalized understanding of the plan.  [place] Ruari Mudgett, MD [facility] Endocrinology [facility] [address] [employer] [employer], [address] [place], [place] [address] Phone # [phone]  At least part of this note was generated using voice recognition software. Inadvertent word errors may have occurred, which were not recognized during the proofreading process.

## 2023-10-01 ENCOUNTER — Encounter: Payer: Self-pay | Admitting: *Deleted

## 2023-10-03 ENCOUNTER — Telehealth: Payer: Self-pay | Admitting: *Deleted

## 2023-10-03 NOTE — Telephone Encounter (Signed)
 Surgery scheduler sent staff message asking if we may be able to move pt's appt sooner from 10/21/23. Pt no showed for her appt 09/13/23 with Josefa Beauvais, FNP. Pt is currently rescheduled to see Dr. Santo 10/21/23. I left pt a message this morning that we could see her tomorrow 8:50 with Orren Fabry, PAC or 10/10/23 or Jackee Alberts, NP 2/13 11:30. I did leave on vm that we cannot promise that these appts will be available when she does call back.   Left message to call back and schedule sooner appt per the request of the surgeon's office. I will update the surgeon office as well.

## 2023-10-03 NOTE — Telephone Encounter (Signed)
-----   Message from [place] D sent at 10/03/2023  9:06 AM EST ----- Regarding: FW: clearance Good morning Niels,   This pt has recurrent breast cancer and is needing cardiac clearance. Looks like she no showed her her appt that was previously scheduled. Is there anyway to get her appt moved up from what is currently scheduled? ----- Message ----- From: Glean Stephane BROCKS, RN Sent: 10/03/2023   8:58 AM EST To: Burnard CHRISTELLA Crete; Bascom JONELLE Crazier; # Subject: RE: clearance                                  Do you have a contact over there to see if we can get it moved up? ----- Message ----- From: [place] Burnard CHRISTELLA Sent: 10/03/2023   8:42 AM EST To: Bascom JONELLE Crazier; Nanetta LOISE Lars, RN; # Subject: RE: clearance                                  It is far out and I believe it was an old follow up. ----- Message ----- From: Glean Stephane BROCKS, RN Sent: 10/02/2023   3:34 PM EST To: Burnard CHRISTELLA [place]; Bascom JONELLE Crazier; # Subject: RE: clearance                                  Oh.. I saw that but thought that was an old f/u appt. That's really far out. ----- Message ----- From: [place] Burnard CHRISTELLA Sent: 10/02/2023   3:31 PM EST To: [place] JONELLE Salvadore Nanetta LOISE Lars, RN; # Subject: RE: clearance                                  Its showing she has an appt on 10/21/23. ----- Message ----- From: Glean Stephane BROCKS, RN Sent: 10/02/2023   3:17 PM EST To: Burnard CHRISTELLA [place]; Bascom JONELLE Crazier; # Subject: RE: clearance                                  Oh goodness! Do you know if they are going to try to get her back in? Thanks Dawn ----- Message ----- From: [place] Burnard CHRISTELLA Sent: 10/02/2023   2:27 PM EST To: Bas[place] JONELLE Salvadore Nanetta LOISE Lars, RN; # Subject: RE: clearance                                  We still do not have clearance. She No showed cardiology appt on 09/13/23 ----- Message ----- From: Glean Stephane BROCKS, RN Sent: 10/01/2023   1:16 PM EST To: Burnard CHRISTELLA [place]; Bascom JONELLE Crazier; # Subject: clearance                                       Hi ladies,  Have you all received clearance on this pt yet for sx?  Thanks, [employer]

## 2023-10-08 ENCOUNTER — Encounter: Payer: Self-pay | Admitting: *Deleted

## 2023-10-08 NOTE — Telephone Encounter (Addendum)
Surgeon office sent me a staff message inquiring if we were able to get the pt in sooner. Pt has not returned our calls. Pt cancelled her 08/14/23 with MD and no showed for 09/13/23 with Edd Fabian, FNP.   Clearance came to our office as procedure TBD and we were asked to see if we could move appt sooner. Our office has tried to reach out to the pt but she has not called back.   I s/w surgeon office and sent staff message as well:   Tarri Fuller, CMA  Rebekah Chesterfield, Eileen Stanford, RN; Pershing Proud, RN; Alric Quan I s/w Reinaldo Meeker, RN with Dr. Corliss Skains office. I stated that we have tried to reach pt but she has not returned our calls.  I did tell Toniann Fail that if she can reach the pt we can get see her Monday 10/14/23 with Azalee Course, PAC at our [place] office: [address], same building as [employer] [facility]. Take elevator to 2nd floor and our office is there to the left. Pt to arrive 15 minutes early for registration.  Pt cancelled her 6 month f/u with Dr. Izora Ribas 08/14/23; no showed for her appt 09/13/23 with Edd Fabian, FNP.  If pt agrees to the appt 10/14/23 she will need to be sure to keep appt, otherwise I have stated to surgeon office pt is then scheduled to see Dr. Ronette Deter 10/21/23 as well.  Toniann Fail, states she will call the pt and let her know of appt 10/14/23. Toniann Fail, also states that they have a different # in their system than we do; their office has # [phone], we do not have that # and will need to confirm with pt as well.  Thank you Okey Regal

## 2023-10-10 ENCOUNTER — Ambulatory Visit: Payer: Medicare HMO | Admitting: Endocrinology

## 2023-10-14 ENCOUNTER — Ambulatory Visit: Payer: Medicare HMO | Admitting: Physician Assistant

## 2023-10-14 NOTE — Progress Notes (Signed)
 This encounter was created in error - please disregard.

## 2023-10-15 ENCOUNTER — Encounter: Payer: Self-pay | Admitting: *Deleted

## 2023-10-15 ENCOUNTER — Ambulatory Visit: Payer: Medicare HMO | Admitting: General Practice

## 2023-10-15 DIAGNOSIS — R300 Dysuria: Secondary | ICD-10-CM | POA: Diagnosis not present

## 2023-10-15 NOTE — Progress Notes (Deleted)
 Cardiology Office Note:    Date:  10/15/2023  ID:  Victoria Blair, DOB 24-Feb-1946, MRN [id number] PCP: Joycelyn Rua, MD  [facility] Providers Cardiologist:  Christell Constant, MD { Click to update primary MD,subspecialty MD or APP then REFRESH:1}    {Click to Open Review  :1}   Patient Profile:      Victoria Blair is a 78 y.o. female with visit-pertinent history of hypertension, tobacco abuse, history of breast cancer, depression, GERD, Graves' disease  She established care with cardiology service in March 2023 for evaluation of syncope.  Echocardiogram on 12/11/2021 showed EF 60-65%, no RWMA, moderate asymmetric LVH, normal RV systolic function, normal pulmonary artery systolic pressure.  Coronary CTA on 12/12/2021 showed a coronary calcium score of 1443 (99th percentile), moderate stenosis.  Study was sent for [facility] which showed no significant stenosis.  They were unable to model the RCA.  Patient was started on ASA and Zetia.  She did develop myalgias on Lipitor.  Because the CT is able to model the RCA, patient underwent nuclear stress test on 01/03/2022 that was normal low risk study without ischemia.  She was seen in the ED on 10/29/2022 after a syncopal episode.  It was determined her symptoms were consistent with orthostatic syncope in the setting of hypovolemia.  She was again seen in the ED on 11/24/2022 with near syncope and again she was found to be hypotensive and treated with IV fluids and symptoms improved.  She was seen in clinic on 12/26/2022 and her lisinopril and metoprolol were discontinued in the setting of hypotension/syncope.  Echocardiogram was completed on 01/23/2023 showing LVEF 65-70%, no RWMA, LVH, grade 1 DD, RV function and size normal.  Heart monitor was ordered and completed 01/22/2023 with 1 run of NSVT that lasted 6 beats and frequent paroxysmal SVT lasting 13 seconds at longest.  She was last seen in clinic on 02/11/2023 where she noted no further  syncope.  Syncope No further syncope.  She has done well since stopping metoprolol and lisinopril.  Her recent monitor did not show any bradycardia arrhythmias or pauses.  If she does have recurrent syncope in future that is unexplained consider referral to EP for ILR.  Hypertension  Coronary artery disease/aortic atherosclerosis Nonobstructive CAD on coronary CTA 11/2021.  Myoview in 12/2021 was low risk.  Aortic insufficiency Moderate aortic insufficiency on echocardiogram 12/2022   {There is no content from the last Narrative History section.}       History of Present Illness:  Discussed the use of AI scribe software for clinical note transcription with the patient, who gave verbal consent to proceed.  Victoria Blair is a 78 y.o. female who returns for ***Discussed the use of AI scribe software for clinical note transcription with the patient, who gave verbal consent to proceed.  History of Present Illness            ROS   See HPI ***     Home Medications:    Prior to Admission medications   Medication Sig Start Date End Date Taking? Authorizing Provider  anastrozole (ARIMIDEX) 1 MG tablet Take 1 tablet (1 mg total) by mouth daily. 09/11/23   Serena Croissant, MD  Ascorbic Acid (VITAMIN C) 500 MG CAPS 1 tablet Orally Once a day    [provider]  aspirin EC 81 MG tablet Take 1 tablet (81 mg total) by mouth daily. Swallow whole. 11/23/21   Christell Constant, MD  Brewers Yeast 487.5  MG TABS two tablets Orally once a day    [provider]  Calcium Carbonate-Vitamin D (CALCIUM + D PO) Take 1 tablet by mouth daily.     [provider]  EPINEPHrine 0.3 mg/0.3 mL IJ SOAJ injection Inject 0.3 mg into the muscle as needed for anaphylaxis. 03/24/23   Particia Nearing, PA-C  ezetimibe (ZETIA) 10 MG tablet TAKE 1 TABLET BY MOUTH EVERY DAY 06/26/23   Chandrasekhar, Mahesh A, MD  fish oil-omega-3 fatty acids 1000 MG capsule Take 1 g by mouth daily.      [provider]  Garlic 500 MG CAPS 1 capsule Orally once a day    [provider]  Homeopathic Products (LEG CRAMPS PO) Take 2 tablets by mouth 2 (two) times daily as needed (leg/hand cramps).    [provider]  ibuprofen (ADVIL) 800 MG tablet Take 800 mg by mouth every 6 (six) hours as needed. 10/23/21   [provider]  levothyroxine (SYNTHROID) 88 MCG tablet Take 1 tablet (88 mcg total) by mouth daily. 05/01/23   Thapa, [place], MD  Multiple Vitamins-Minerals (PRESERVISION AREDS) CAPS 2 capsules Orally once a day    [provider]  nitroGLYCERIN (NITROSTAT) 0.4 MG SL tablet Place 1 tablet (0.4 mg total) under the tongue every 5 (five) minutes as needed for chest pain (may take up to 3 tablets). 12/15/21   Christell Constant, MD  phenazopyridine (PYRIDIUM) 100 MG tablet Take 1 tablet (100 mg total) by mouth 3 (three) times daily as needed for pain. 10/30/22   Valentino Nose, NP  pravastatin (PRAVACHOL) 20 MG tablet TAKE 1 TABLET BY MOUTH EVERY DAY IN THE EVENING 09/18/22   Christell Constant, MD  Probiotic Product (PROBIOTIC DAILY) CAPS Take 1 capsule by mouth daily.    [provider]  sodium fluoride (SODIUM FLUORIDE 5000 PLUS) 1.1 % CREA dental cream as directed Dental daily    [provider]  triamcinolone cream (KENALOG) 0.1 % Apply 1 Application topically 2 (two) times daily. 03/24/23   Particia Nearing, PA-C  valACYclovir (VALTREX) 500 MG tablet Take 500 mg by mouth daily. 07/30/22   [provider]  VITAMIN E PO Take 1 tablet by mouth daily. Unsure of dose    [provider]   Studies Reviewed:       *** Risk Assessment/Calculations:   {Does this patient have ATRIAL FIBRILLATION?:[id number]} No BP recorded.  {Refresh Note OR Click here to enter BP  :1}***       Physical Exam:   VS:  There were no vitals taken for this visit.   Wt Readings from Last 3 Encounters:  09/25/23 130 lb 3.2 oz  (59.1 kg)  09/11/23 133 lb 4.8 oz (60.5 kg)  07/03/23 124 lb 12.8 oz (56.6 kg)    Physical Exam***     Assessment and Plan:  Assessment and Plan              {Are you ordering a CV Procedure (e.g. stress test, cath, DCCV, TEE, etc)?   Press F2        :[id number]}  Dispo:  No follow-ups on file.  Signed, Denyce Robert, NP

## 2023-10-21 ENCOUNTER — Ambulatory Visit: Payer: Medicare HMO | Attending: Internal Medicine | Admitting: Internal Medicine

## 2023-10-21 ENCOUNTER — Ambulatory Visit: Payer: Medicare HMO | Admitting: Internal Medicine

## 2023-10-21 DIAGNOSIS — I25118 Atherosclerotic heart disease of native coronary artery with other forms of angina pectoris: Secondary | ICD-10-CM

## 2023-10-21 DIAGNOSIS — Z01818 Encounter for other preprocedural examination: Secondary | ICD-10-CM

## 2023-10-21 NOTE — Progress Notes (Unsigned)
No Show. No Charge.

## 2023-10-21 NOTE — Progress Notes (Deleted)
 Cardiology Office Note:  .    Date:  10/21/2023  ID:  Victoria Blair, DOB 1945/11/01, MRN [id number] PCP: Joycelyn Rua, MD  [facility] Providers Cardiologist:  Christell Constant, MD { Click to update primary MD,subspecialty MD or APP then REFRESH:1}    CC: *** Consulted for the evaluation of *** at the behest of ***   History of Present Illness: .    Victoria Blair is a 78 y.o. female ***  @Discussed  the use of AI scribe software for clinical note transcription with the patient, who gave verbal consent to proceed.  History of Present Illness             Relevant histories: .  Social  2023: - Echo (normal); - CT Mild non obstructive CAD, oRCA ~ 50% unable to model FFR.  Added PRN nitro and discussed potential LHC. No RCA ischemia on NM Stress.  2024: Saw Tereso Newcomer- no issues.  Stable AI ROS: As per HPI.   Studies Reviewed: .   Cardiac Studies & Procedures   ______________________________________________________________________________________________   STRESS TESTS  MYOCARDIAL PERFUSION IMAGING 01/03/2022  Narrative   The study is normal. The study is low risk.   No ST deviation was noted.   LV perfusion is normal. There is no evidence of ischemia. There is no evidence of infarction.   Left ventricular function is normal. Nuclear stress EF: 67 %. The left ventricular ejection fraction is hyperdynamic (>65%). End diastolic cavity size is normal. End systolic cavity size is normal.   ECHOCARDIOGRAM  ECHOCARDIOGRAM COMPLETE 01/23/2023  Narrative ECHOCARDIOGRAM REPORT    Patient Name:   Victoria Blair Date of Exam: 01/23/2023 Medical Rec #:  [id number]          Height:       67.5 in Accession #:    [id number]         Weight:       124.0 lb Date of Birth:  1946-06-06           BSA:          1.660 m Patient Age:    76 years           BP:           130/60 mmHg Patient Gender: F                  HR:           83 bpm. Exam Location:   [address]  Procedure: 2D Echo, Cardiac Doppler and Color Doppler  Indications:    R55 Syncope  History:        Patient has prior history of Echocardiogram examinations, most recent 12/11/2021. Risk Factors:Hypertension. History of breast cancer.  Sonographer:    Daphine Deutscher RDCS Referring Phys: [id number] Jonita Albee  IMPRESSIONS   1. Left ventricular ejection fraction, by estimation, is 65 to 70%. The left ventricle has normal function. The left ventricle has no regional wall motion abnormalities. There is moderate concentric left ventricular hypertrophy. Left ventricular diastolic parameters are consistent with Grade I diastolic dysfunction (impaired relaxation). 2. Right ventricular systolic function is normal. The right ventricular size is normal. There is normal pulmonary artery systolic pressure. The estimated right ventricular systolic pressure is 29.4 mmHg. 3. The mitral valve is normal in structure. No evidence of mitral valve regurgitation. No evidence of mitral stenosis. 4. The aortic valve is tricuspid. There is mild calcification of the aortic valve. Aortic valve  regurgitation is moderate. No aortic stenosis is present. 5. The inferior vena cava is normal in size with greater than 50% respiratory variability, suggesting right atrial pressure of 3 mmHg.  FINDINGS Left Ventricle: Left ventricular ejection fraction, by estimation, is 65 to 70%. The left ventricle has normal function. The left ventricle has no regional wall motion abnormalities. The left ventricular internal cavity size was normal in size. There is moderate concentric left ventricular hypertrophy. Left ventricular diastolic parameters are consistent with Grade I diastolic dysfunction (impaired relaxation).  Right Ventricle: The right ventricular size is normal. No increase in right ventricular wall thickness. Right ventricular systolic function is normal. There is normal pulmonary artery systolic  pressure. The tricuspid regurgitant velocity is 2.57 m/s, and with an assumed right atrial pressure of 3 mmHg, the estimated right ventricular systolic pressure is 29.4 mmHg.  Left Atrium: Left atrial size was normal in size.  Right Atrium: Right atrial size was normal in size.  Pericardium: There is no evidence of pericardial effusion.  Mitral Valve: The mitral valve is normal in structure. There is mild calcification of the mitral valve leaflet(s). Mild mitral annular calcification. No evidence of mitral valve regurgitation. No evidence of mitral valve stenosis.  Tricuspid Valve: The tricuspid valve is normal in structure. Tricuspid valve regurgitation is mild.  Aortic Valve: The aortic valve is tricuspid. There is mild calcification of the aortic valve. Aortic valve regurgitation is moderate. Aortic regurgitation PHT measures 323 msec. No aortic stenosis is present.  Pulmonic Valve: The pulmonic valve was normal in structure. Pulmonic valve regurgitation is not visualized.  Aorta: The aortic root is normal in size and structure.  Venous: The inferior vena cava is normal in size with greater than 50% respiratory variability, suggesting right atrial pressure of 3 mmHg.  IAS/Shunts: No atrial level shunt detected by color flow Doppler.   LEFT VENTRICLE PLAX 2D LVIDd:         3.90 cm   Diastology LVIDs:         1.80 cm   LV e' medial:    5.08 cm/s LV PW:         1.00 cm   LV E/e' medial:  11.4 LV IVS:        1.00 cm   LV e' lateral:   5.77 cm/s LVOT diam:     1.90 cm   LV E/e' lateral: 10.0 LV SV:         70 LV SV Index:   42 LVOT Area:     2.84 cm   RIGHT VENTRICLE RV Basal diam:  3.60 cm RV S prime:     16.07 cm/s TAPSE (M-mode): 2.9 cm  LEFT ATRIUM             Index        RIGHT ATRIUM           Index LA diam:        3.10 cm 1.87 cm/m   RA Area:     12.50 cm LA Vol (A2C):   21.9 ml 13.20 ml/m  RA Volume:   29.40 ml  17.72 ml/m LA Vol (A4C):   26.8 ml 16.15 ml/m LA  Biplane Vol: 26.2 ml 15.79 ml/m AORTIC VALVE LVOT Vmax:   118.33 cm/s LVOT Vmean:  80.400 cm/s LVOT VTI:    0.245 m AI PHT:      323 msec  AORTA Ao Root diam: 3.20 cm Ao Asc diam:  3.40 cm  MITRAL VALVE  TRICUSPID VALVE MV Area (PHT): 3.21 cm     TR Peak grad:   26.4 mmHg MV Decel Time: 236 msec     TR Vmax:        257.00 cm/s MV E velocity: 57.65 cm/s MV A velocity: 100.50 cm/s  SHUNTS MV E/A ratio:  0.57         Systemic VTI:  0.25 m Systemic Diam: 1.90 cm  Dalton McleanMD Electronically signed by Wilfred Lacy Signature Date/Time: 01/23/2023/3:57:37 PM    Final    MONITORS  LONG TERM MONITOR (3-14 DAYS) 01/22/2023  Narrative   Patient had a minimum heart rate of 54 bpm, maximum heart rate of 182 bpm, and average heart rate of 80 bpm.   Predominant underlying rhythm was sinus rhythm.   One run of NSVT that lasted 6 beats.   Frequent paroxsymal SVT lasting 13 seconds at longest.   Isolated PACs were rare (<1.0%).   Isolated PVCs were rare (<1.0%).   No triggered and diary events.  Asymptomatic SVT.   CT SCANS  CT CORONARY FRACTIONAL FLOW RESERVE DATA PREP 12/12/2021  Narrative EXAM: CT FFR ANALYSIS  CLINICAL DATA:  Coronary artery disease (CAD) (Ped 0-17y)  FINDINGS: FFRct analysis was performed on the original cardiac CT angiogram dataset. Diagrammatic representation of the FFRct analysis is provided in a separate PDF document in PACS. This dictation was created using the PDF document and an interactive 3D model of the results. 3D model is not available in the EMR/PACS. Normal FFR range is >0.80. Indeterminate (grey) zone is 0.76-0.80.  1. Left Main: FFR = 1.00  2. LAD: Proximal FFR = 0.98, mid FFR = 0.95, distal FFR = 0.88 3. LCX: Proximal FFR = 0.98, distal FFR = not analyzed 4. RCA: Proximal FFR = Unable to be analyzed due to significant artifact.  IMPRESSION: 1.  CT FFR analysis showed no significant  stenosis.  RECOMMENDATIONS: Guideline-directed medical therapy and aggressive risk factor modification for secondary prevention of coronary artery disease.   Electronically Signed By: Thomasene Ripple D.O. On: 12/14/2021 19:59   CT CORONARY MORPH W/CTA COR W/SCORE 12/12/2021  Addendum 12/12/2021  3:06 PM ADDENDUM REPORT: 12/12/2021 15:04  CLINICAL DATA:  This is a 78 year old female with anginal symptoms.  EXAM: Cardiac/Coronary  CTA  TECHNIQUE: The patient was scanned on a [employer].  FINDINGS: A 100 kV prospective scan was triggered in the descending thoracic aorta at 111 HU's. Axial non-contrast 3 mm slices were carried out through the heart. The data set was analyzed on a dedicated work station and scored using the Agatson method. Gantry rotation speed was 250 msecs and collimation was .6 mm. No beta blockade and 0.8 mg of sl NTG was given. The 3D data set was reconstructed in 5% intervals of the 67-82 % of the R-R cycle. Diastolic phases were analyzed on a dedicated work station using MPR, MIP and VRT modes. The patient received 80 cc of contrast.  Image Quality: Fair, with misregistration artifact.  Aorta: Normal size.  No calcifications.  No dissection.  Aortic Valve:  Trileaflet.  No calcifications.  Coronary Arteries:  Normal coronary origin.  Right dominance.  RCA is a large dominant artery that gives rise to PDA and PLA. The RCA is diffusely calcified. There is a long (27 mm) calcified plaque which starts as a mild (25-49%) in the ostium of the RCA and terminates as a moderate (50-69%) plaque. The mid RCA with a moderate calcified plaque in the  mid RCA. Due to multiple stair-step artifact the distal RCA is not well visualized- there is calcified plaque present that can not be accurately quantified.  Left main is a large artery that gives rise to LAD and LCX arteries.  LAD is a large vessel. The proximal LAD with mild calcified lesions. The mid  LAD with 2 separate plaques: There is a focal mild soft plaque and right below this is a long 14.4 mm moderate calcified plaque. The distal LAD with minimal luminal irregularities. D1 with mild calcified plaque in the proximal portion of the vessel.  LCX is a non-dominant artery that gives rise to one large OM1 branch. There is moderate mixed plaque in the proximal LCX. The mid and distal LCX with no plaques.  Coronary Calcium Score:  Left main: 0  Left anterior descending artery: 540  Left circumflex artery: 49  Right coronary artery: 854  Total: 1443  Percentile: 99  Other findings:  Normal pulmonary vein drainage into the left atrium.  Normal left atrial appendage without a thrombus.  Normal size of the pulmonary artery.  Mild mitral annular calcification  IMPRESSION: 1. Coronary calcium score of 1443. This was 21 percentile for age and sex matched control.  2. Normal coronary origin with right dominance.  3. CAD-RADS 3. Moderate stenosis. Consider symptom-guided anti-ischemic pharmacotherapy as well as risk factor modification per guideline directed care. Additional analysis with CT FFR will be submitted.  The noncardiac portion of this study will be interpreted in separate report by the radiologist.   Electronically Signed By: Thomasene Ripple D.O. On: 12/12/2021 15:04  Narrative EXAM: OVER-READ INTERPRETATION  CT CHEST  The following report is an over-read performed by radiologist Dr. Allegra Lai of [facility] on 12/12/2021. This over-read does not include interpretation of cardiac or coronary anatomy or pathology. The coronary calcium score/coronary CTA interpretation by the cardiologist is attached.  COMPARISON:  Multiple priors, most recent chest CT dated January 25, 2017  FINDINGS: Vascular: Normal heart size. Normal caliber thoracic aorta with moderate atherosclerotic disease. No suspicious filling defects of the visualized  pulmonary arteries.  Mediastinum/Nodes: Esophagus is unremarkable. No pathologically enlarged lymph nodes seen in the chest.  Lungs/Pleura: Central airways are patent. Right lower lobe patchy consolidations. Left lower lobe nodule measuring 5 mm on series 11, image 30, unchanged size of compared with priors dating back to 2015, favored to be benign. Centrilobular emphysema. No evidence of pleural effusion or pneumothorax.  Upper Abdomen: No acute abnormality.  Musculoskeletal: No chest wall mass or suspicious bone lesions identified.  IMPRESSION: 1. Patchy right lower lobe consolidations, likely due to infection or aspiration. Recommend follow-up chest CT in 3 months to ensure resolution. 2. Aortic Atherosclerosis (ICD10-I70.0) and Emphysema (ICD10-J43.9).  Electronically Signed: By: Allegra Lai M.D. On: 12/12/2021 12:14     ______________________________________________________________________________________________      Results           *** Risk Assessment/Calculations:    {Does this patient have ATRIAL FIBRILLATION?:[phone]}  {This patient may be at risk for Amyloid. She has one or more dx on the problem list or PMH from the following list - Abnormal EKG, CHF, Aortic Stenosis, Proteinuria, LVH, Carpal Tunnel Syndrome, Biceps Tendon Rupture, Syncope. See list below or review PMH.  Diagnoses From Problem List           Noted     Syncope and collapse 11/23/2021    Click HERE to open Cardiac Amyloid Screening SmartSet to order screening OR Click  HERE to defer testing for 1 year or permanently :1}    Physical Exam:    VS:  There were no vitals taken for this visit.   Wt Readings from Last 3 Encounters:  09/25/23 130 lb 3.2 oz (59.1 kg)  09/11/23 133 lb 4.8 oz (60.5 kg)  07/03/23 124 lb 12.8 oz (56.6 kg)    Gen: *** distress, *** obese/well nourished/malnourished   Neck: No JVD, *** carotid bruit Ears: *** Frank Sign Cardiac: No Rubs or Gallops,  *** Murmur, ***cardia, *** radial pulses Respiratory: Clear to auscultation bilaterally, *** effort, ***  respiratory rate GI: Soft, nontender, non-distended *** MS: No *** edema; *** moves all extremities Integument: Skin feels *** Neuro:  At time of evaluation, alert and oriented to person/place/time/situation *** Psych: Normal affect, patient feels ***   ASSESSMENT AND PLAN: .    *** An EKG was ordered for *** and shows ***  Riley Lam, MD [facility] Cardiologist [facility]  [address] [place], [place] [address] [phone]  8:10 AM

## 2023-10-24 ENCOUNTER — Other Ambulatory Visit: Payer: Self-pay | Admitting: Internal Medicine

## 2023-10-24 ENCOUNTER — Encounter: Payer: Self-pay | Admitting: *Deleted

## 2023-11-05 ENCOUNTER — Encounter: Payer: Self-pay | Admitting: *Deleted

## 2023-11-12 ENCOUNTER — Encounter: Payer: Self-pay | Admitting: Physician Assistant

## 2023-11-12 ENCOUNTER — Ambulatory Visit: Attending: Physician Assistant | Admitting: Physician Assistant

## 2023-11-12 VITALS — BP 162/70 | HR 81 | Wt 130.8 lb

## 2023-11-12 DIAGNOSIS — I2510 Atherosclerotic heart disease of native coronary artery without angina pectoris: Secondary | ICD-10-CM | POA: Diagnosis not present

## 2023-11-12 DIAGNOSIS — Z01810 Encounter for preprocedural cardiovascular examination: Secondary | ICD-10-CM | POA: Diagnosis not present

## 2023-11-12 DIAGNOSIS — I351 Nonrheumatic aortic (valve) insufficiency: Secondary | ICD-10-CM

## 2023-11-12 DIAGNOSIS — I25118 Atherosclerotic heart disease of native coronary artery with other forms of angina pectoris: Secondary | ICD-10-CM | POA: Diagnosis not present

## 2023-11-12 DIAGNOSIS — I10 Essential (primary) hypertension: Secondary | ICD-10-CM | POA: Diagnosis not present

## 2023-11-12 DIAGNOSIS — E785 Hyperlipidemia, unspecified: Secondary | ICD-10-CM | POA: Diagnosis not present

## 2023-11-12 MED ORDER — CARVEDILOL 6.25 MG PO TABS: 6.2500 mg | ORAL_TABLET | Freq: Two times a day (BID) | ORAL | 3 refills | Status: AC

## 2023-11-12 NOTE — Patient Instructions (Signed)
 Medication Instructions:  START COREG 6.25 MG TWICE A DAY *If you need a refill on your cardiac medications before your next appointment, please call your pharmacy*   Lab Work: NO LABS If you have labs (blood work) drawn today and your tests are completely normal, you will receive your results only by: MyChart Message (if you have MyChart) OR A paper copy in the mail If you have any lab test that is abnormal or we need to change your treatment, we will call you to review the results.   Testing/Procedures: NO TESTING   Follow-Up: At [facility], you and your health needs are our priority.  As part of our continuing mission to provide you with exceptional heart care, we have created designated Provider Care Teams.  These Care Teams include your primary Cardiologist (physician) and Advanced Practice Providers (APPs -  Physician Assistants and Nurse Practitioners) who all work together to provide you with the care you need, when you need it.  Your next appointment:   4-6 week(s)  Provider:   Azalee Course, PA    Other Instructions IF SYSTOLIC BLOOD PRESSURE IS 150 OR HIGHER AFTER 2 WEEKS INCREASE COREG TO 12.5 MG TWICE A DAY

## 2023-11-12 NOTE — Progress Notes (Unsigned)
 Cardiology Office Note:  .   Date:  11/13/2023  ID:  Victoria Blair, DOB 08-13-1946, MRN [id number] PCP: Henrine Screws, MD  [facility] Providers Cardiologist:  Christell Constant, MD     History of Present Illness: .   Victoria Blair is a 78 y.o. female with past medical history of CAD, aortic insufficiency, hypertension, hyperlipidemia intolerant of statins, tobacco use, breast cancer s/p radiation and history of syncope.  Patient previously had a coronary CT on 12/12/2021 which showed calcium score 1443 which places her at 99th percentile, 25 to 49% disease in ostial RCA, 50 to 69% disease in mid RCA, soft plaque in the LAD with distal irregularities, moderate plaque in proximal left circumflex artery.  FFR was normal.  Myoview obtained on 01/03/2022 showed no ischemia, EF 67%, overall low risk study.  She was seen in the ED in May 2024 with syncope.  She was hypotensive at the time and the treated with IV fluid.  Her lisinopril and metoprolol were discontinued.  Echocardiogram obtained on 01/23/2023 showed EF 65 to 70%, no regional wall motion abnormality, moderate LVH, grade 1 DD, RVSP 29.4 mmHg, moderate AI.  Heart monitor showed primarily showed sinus rhythm, 1 run of NSVT, frequent paroxysmal SVT longest 13 seconds.  She was last seen by Tereso Newcomer in June 2024 at which time she was doing well without any further recurrent syncope.  Unfortunately, her blood pressure medication were all stopped in 2024 when she had syncope.  Patient presents today for preoperative clearance after no showed for multiple cardiology visits.  She denies any recent chest pain or shortness of breath.  She has no problem accomplishing more than 4 METS of activity.  She is at acceptable risk to proceed with surgery from the cardiac perspective without further cardiac workup.  Blood pressure however has been uncontrolled.  I will add carvedilol 6.25 mg twice a day to her medical regimen.  She has been  instructed to increase carvedilol to 12.5 mg twice a day after 2 weeks if systolic blood pressure is 150 or higher.  Otherwise, we plan to see the patient back in 4 to 6 weeks.  We plan to repeat echocardiogram around May or June to follow-up on the moderate aortic stenosis.  ROS:   She denies chest pain, palpitations, dyspnea, pnd, orthopnea, n, v, dizziness, syncope, edema, weight gain, or early satiety. All other systems reviewed and are otherwise negative except as noted above.    Studies Reviewed: Marland Kitchen   EKG Interpretation Date/Time:  Tuesday November 12 2023 11:04:06 EDT Ventricular Rate:  81 PR Interval:  136 QRS Duration:  78 QT Interval:  386 QTC Calculation: 448 R Axis:   73  Text Interpretation: Normal sinus rhythm Normal ECG When compared with ECG of 24-Nov-2022 14:07, PREVIOUS ECG IS PRESENT Confirmed by Azalee Course [phone]) on 11/12/2023 11:50:02 AM    Cardiac Studies & Procedures   ______________________________________________________________________________________________   STRESS TESTS  MYOCARDIAL PERFUSION IMAGING 01/03/2022  Narrative   The study is normal. The study is low risk.   No ST deviation was noted.   LV perfusion is normal. There is no evidence of ischemia. There is no evidence of infarction.   Left ventricular function is normal. Nuclear stress EF: 67 %. The left ventricular ejection fraction is hyperdynamic (>65%). End diastolic cavity size is normal. End systolic cavity size is normal.   ECHOCARDIOGRAM  ECHOCARDIOGRAM COMPLETE 01/23/2023  Narrative ECHOCARDIOGRAM REPORT    Patient Name:  Victoria Blair Date of Exam: 01/23/2023 Medical Rec #:  [id number]          Height:       67.5 in Accession #:    [id number]         Weight:       124.0 lb Date of Birth:  1946/01/31           BSA:          1.660 m Patient Age:    76 years           BP:           130/60 mmHg Patient Gender: F                  HR:           83 bpm. Exam Location:  [address]  Procedure: 2D Echo, Cardiac Doppler and Color Doppler  Indications:    R55 Syncope  History:        Patient has prior history of Echocardiogram examinations, most recent 12/11/2021. Risk Factors:Hypertension. History of breast cancer.  Sonographer:    Daphine Deutscher RDCS Referring Phys: [id number] Jonita Albee  IMPRESSIONS   1. Left ventricular ejection fraction, by estimation, is 65 to 70%. The left ventricle has normal function. The left ventricle has no regional wall motion abnormalities. There is moderate concentric left ventricular hypertrophy. Left ventricular diastolic parameters are consistent with Grade I diastolic dysfunction (impaired relaxation). 2. Right ventricular systolic function is normal. The right ventricular size is normal. There is normal pulmonary artery systolic pressure. The estimated right ventricular systolic pressure is 29.4 mmHg. 3. The mitral valve is normal in structure. No evidence of mitral valve regurgitation. No evidence of mitral stenosis. 4. The aortic valve is tricuspid. There is mild calcification of the aortic valve. Aortic valve regurgitation is moderate. No aortic stenosis is present. 5. The inferior vena cava is normal in size with greater than 50% respiratory variability, suggesting right atrial pressure of 3 mmHg.  FINDINGS Left Ventricle: Left ventricular ejection fraction, by estimation, is 65 to 70%. The left ventricle has normal function. The left ventricle has no regional wall motion abnormalities. The left ventricular internal cavity size was normal in size. There is moderate concentric left ventricular hypertrophy. Left ventricular diastolic parameters are consistent with Grade I diastolic dysfunction (impaired relaxation).  Right Ventricle: The right ventricular size is normal. No increase in right ventricular wall thickness. Right ventricular systolic function is normal. There is normal pulmonary artery systolic  pressure. The tricuspid regurgitant velocity is 2.57 m/s, and with an assumed right atrial pressure of 3 mmHg, the estimated right ventricular systolic pressure is 29.4 mmHg.  Left Atrium: Left atrial size was normal in size.  Right Atrium: Right atrial size was normal in size.  Pericardium: There is no evidence of pericardial effusion.  Mitral Valve: The mitral valve is normal in structure. There is mild calcification of the mitral valve leaflet(s). Mild mitral annular calcification. No evidence of mitral valve regurgitation. No evidence of mitral valve stenosis.  Tricuspid Valve: The tricuspid valve is normal in structure. Tricuspid valve regurgitation is mild.  Aortic Valve: The aortic valve is tricuspid. There is mild calcification of the aortic valve. Aortic valve regurgitation is moderate. Aortic regurgitation PHT measures 323 msec. No aortic stenosis is present.  Pulmonic Valve: The pulmonic valve was normal in structure. Pulmonic valve regurgitation is not visualized.  Aorta: The aortic root is normal in size and  structure.  Venous: The inferior vena cava is normal in size with greater than 50% respiratory variability, suggesting right atrial pressure of 3 mmHg.  IAS/Shunts: No atrial level shunt detected by color flow Doppler.   LEFT VENTRICLE PLAX 2D LVIDd:         3.90 cm   Diastology LVIDs:         1.80 cm   LV e' medial:    5.08 cm/s LV PW:         1.00 cm   LV E/e' medial:  11.4 LV IVS:        1.00 cm   LV e' lateral:   5.77 cm/s LVOT diam:     1.90 cm   LV E/e' lateral: 10.0 LV SV:         70 LV SV Index:   42 LVOT Area:     2.84 cm   RIGHT VENTRICLE RV Basal diam:  3.60 cm RV S prime:     16.07 cm/s TAPSE (M-mode): 2.9 cm  LEFT ATRIUM             Index        RIGHT ATRIUM           Index LA diam:        3.10 cm 1.87 cm/m   RA Area:     12.50 cm LA Vol (A2C):   21.9 ml 13.20 ml/m  RA Volume:   29.40 ml  17.72 ml/m LA Vol (A4C):   26.8 ml 16.15 ml/m LA  Biplane Vol: 26.2 ml 15.79 ml/m AORTIC VALVE LVOT Vmax:   118.33 cm/s LVOT Vmean:  80.400 cm/s LVOT VTI:    0.245 m AI PHT:      323 msec  AORTA Ao Root diam: 3.20 cm Ao Asc diam:  3.40 cm  MITRAL VALVE                TRICUSPID VALVE MV Area (PHT): 3.21 cm     TR Peak grad:   26.4 mmHg MV Decel Time: 236 msec     TR Vmax:        257.00 cm/s MV E velocity: 57.65 cm/s MV A velocity: 100.50 cm/s  SHUNTS MV E/A ratio:  0.57         Systemic VTI:  0.25 m Systemic Diam: 1.90 cm  Dalton McleanMD Electronically signed by Wilfred Lacy Signature Date/Time: 01/23/2023/3:57:37 PM    Final    MONITORS  LONG TERM MONITOR (3-14 DAYS) 01/22/2023  Narrative   Patient had a minimum heart rate of 54 bpm, maximum heart rate of 182 bpm, and average heart rate of 80 bpm.   Predominant underlying rhythm was sinus rhythm.   One run of NSVT that lasted 6 beats.   Frequent paroxsymal SVT lasting 13 seconds at longest.   Isolated PACs were rare (<1.0%).   Isolated PVCs were rare (<1.0%).   No triggered and diary events.  Asymptomatic SVT.   CT SCANS  CT CORONARY FRACTIONAL FLOW RESERVE DATA PREP 12/12/2021  Narrative EXAM: CT FFR ANALYSIS  CLINICAL DATA:  Coronary artery disease (CAD) (Ped 0-17y)  FINDINGS: FFRct analysis was performed on the original cardiac CT angiogram dataset. Diagrammatic representation of the FFRct analysis is provided in a separate PDF document in PACS. This dictation was created using the PDF document and an interactive 3D model of the results. 3D model is not available in the EMR/PACS. Normal FFR range is >0.80. Indeterminate (grey) zone is 0.76-0.80.  1. Left Main: FFR = 1.00  2. LAD: Proximal FFR = 0.98, mid FFR = 0.95, distal FFR = 0.88 3. LCX: Proximal FFR = 0.98, distal FFR = not analyzed 4. RCA: Proximal FFR = Unable to be analyzed due to significant artifact.  IMPRESSION: 1.  CT FFR analysis showed no significant  stenosis.  RECOMMENDATIONS: Guideline-directed medical therapy and aggressive risk factor modification for secondary prevention of coronary artery disease.   Electronically Signed By: Thomasene Ripple D.O. On: 12/14/2021 19:59   CT CORONARY MORPH W/CTA COR W/SCORE 12/12/2021  Addendum 12/12/2021  3:06 PM ADDENDUM REPORT: 12/12/2021 15:04  CLINICAL DATA:  This is a 78 year old female with anginal symptoms.  EXAM: Cardiac/Coronary  CTA  TECHNIQUE: The patient was scanned on a [employer].  FINDINGS: A 100 kV prospective scan was triggered in the descending thoracic aorta at 111 HU's. Axial non-contrast 3 mm slices were carried out through the heart. The data set was analyzed on a dedicated work station and scored using the Agatson method. Gantry rotation speed was 250 msecs and collimation was .6 mm. No beta blockade and 0.8 mg of sl NTG was given. The 3D data set was reconstructed in 5% intervals of the 67-82 % of the R-R cycle. Diastolic phases were analyzed on a dedicated work station using MPR, MIP and VRT modes. The patient received 80 cc of contrast.  Image Quality: Fair, with misregistration artifact.  Aorta: Normal size.  No calcifications.  No dissection.  Aortic Valve:  Trileaflet.  No calcifications.  Coronary Arteries:  Normal coronary origin.  Right dominance.  RCA is a large dominant artery that gives rise to PDA and PLA. The RCA is diffusely calcified. There is a long (27 mm) calcified plaque which starts as a mild (25-49%) in the ostium of the RCA and terminates as a moderate (50-69%) plaque. The mid RCA with a moderate calcified plaque in the mid RCA. Due to multiple stair-step artifact the distal RCA is not well visualized- there is calcified plaque present that can not be accurately quantified.  Left main is a large artery that gives rise to LAD and LCX arteries.  LAD is a large vessel. The proximal LAD with mild calcified lesions. The mid  LAD with 2 separate plaques: There is a focal mild soft plaque and right below this is a long 14.4 mm moderate calcified plaque. The distal LAD with minimal luminal irregularities. D1 with mild calcified plaque in the proximal portion of the vessel.  LCX is a non-dominant artery that gives rise to one large OM1 branch. There is moderate mixed plaque in the proximal LCX. The mid and distal LCX with no plaques.  Coronary Calcium Score:  Left main: 0  Left anterior descending artery: 540  Left circumflex artery: 49  Right coronary artery: 854  Total: 1443  Percentile: 99  Other findings:  Normal pulmonary vein drainage into the left atrium.  Normal left atrial appendage without a thrombus.  Normal size of the pulmonary artery.  Mild mitral annular calcification  IMPRESSION: 1. Coronary calcium score of 1443. This was 70 percentile for age and sex matched control.  2. Normal coronary origin with right dominance.  3. CAD-RADS 3. Moderate stenosis. Consider symptom-guided anti-ischemic pharmacotherapy as well as risk factor modification per guideline directed care. Additional analysis with CT FFR will be submitted.  The noncardiac portion of this study will be interpreted in separate report by the radiologist.   Electronically Signed By: Lavona Mound  Tobb D.O. On: 12/12/2021 15:04  Narrative EXAM: OVER-READ INTERPRETATION  CT CHEST  The following report is an over-read performed by radiologist Dr. Allegra Lai of [facility] on 12/12/2021. This over-read does not include interpretation of cardiac or coronary anatomy or pathology. The coronary calcium score/coronary CTA interpretation by the cardiologist is attached.  COMPARISON:  Multiple priors, most recent chest CT dated January 25, 2017  FINDINGS: Vascular: Normal heart size. Normal caliber thoracic aorta with moderate atherosclerotic disease. No suspicious filling defects of the visualized  pulmonary arteries.  Mediastinum/Nodes: Esophagus is unremarkable. No pathologically enlarged lymph nodes seen in the chest.  Lungs/Pleura: Central airways are patent. Right lower lobe patchy consolidations. Left lower lobe nodule measuring 5 mm on series 11, image 30, unchanged size of compared with priors dating back to 2015, favored to be benign. Centrilobular emphysema. No evidence of pleural effusion or pneumothorax.  Upper Abdomen: No acute abnormality.  Musculoskeletal: No chest wall mass or suspicious bone lesions identified.  IMPRESSION: 1. Patchy right lower lobe consolidations, likely due to infection or aspiration. Recommend follow-up chest CT in 3 months to ensure resolution. 2. Aortic Atherosclerosis (ICD10-I70.0) and Emphysema (ICD10-J43.9).  Electronically Signed: By: Allegra Lai M.D. On: 12/12/2021 12:14     ______________________________________________________________________________________________      Risk Assessment/Calculations:            Physical Exam:   VS:  BP (!) 162/70 (BP Location: Left Arm, Patient Position: Sitting, Cuff Size: Normal)   Pulse 81   Wt 130 lb 12.8 oz (59.3 kg)   SpO2 96%   BMI 20.49 kg/m    Wt Readings from Last 3 Encounters:  11/12/23 130 lb 12.8 oz (59.3 kg)  09/25/23 130 lb 3.2 oz (59.1 kg)  09/11/23 133 lb 4.8 oz (60.5 kg)    GEN: Well nourished, well developed in no acute distress NECK: No JVD; No carotid bruits CARDIAC: RRR, no murmurs, rubs, gallops RESPIRATORY:  Clear to auscultation without rales, wheezing or rhonchi  ABDOMEN: Soft, non-tender, non-distended EXTREMITIES:  No edema; No deformity   ASSESSMENT AND PLAN: .     Preoperative Clearance Acceptable to proceed with moderate-risk mastectomy from a cardiac perspective. Blood pressure control required for surgical clearance. -Will need to make sure systolic blood pressure is at least below 160 before proceeding with  surgery.  Hypertension Uncontrolled hypertension with systolic readings in the 160s. Antihypertensives discontinued post-syncope. Current blood pressure too high for surgical clearance. Carvedilol preferred for control. - Initiate carvedilol 6.25 mg twice a day. - Increase carvedilol to 12.5 mg twice a day if systolic blood pressure remains 150 or higher after two weeks. - Reassess blood pressure in four to six weeks.  Coronary Artery Disease (CAD) CAD with significant coronary plaque.  However coronary CT in 2023 showed nonobstructive disease -Denies any recent chest pain.  Myoview obtained 2023 was negative  Aortic insufficiency - Order repeat echocardiogram around May or June to follow up on moderate aortic insufficiency.       Dispo: Follow-up in 4 to 6 weeks  Signed, Azalee Course, [place]

## 2023-11-13 ENCOUNTER — Encounter: Payer: Self-pay | Admitting: *Deleted

## 2023-11-20 ENCOUNTER — Encounter: Payer: Self-pay | Admitting: *Deleted

## 2023-11-20 ENCOUNTER — Telehealth: Payer: Self-pay | Admitting: *Deleted

## 2023-11-20 NOTE — Telephone Encounter (Signed)
 Called pt and left vm regarding calling [facility] surgery scheduling now that pt has received cardiac clearance.  Provided contact name and number. Requested return call with questions or concerns. Provided contact information.

## 2023-11-28 ENCOUNTER — Encounter: Payer: Self-pay | Admitting: *Deleted

## 2023-11-28 NOTE — Progress Notes (Signed)
 Surgical Instructions   Your procedure is scheduled on Thursday, April 10th, 2025. Report to [facility] Main Entrance "A" at 9:30 A.M., then check in with the Admitting office. Any questions or running late day of surgery: call [phone]  Questions prior to your surgery date: call [phone], Monday-Friday, 8am-4pm. If you experience any cold or flu symptoms such as cough, fever, chills, shortness of breath, etc. between now and your scheduled surgery, please notify us at the above number.     Remember:  Do not eat after midnight the night before your surgery   You may drink clear liquids until 8:30 the morning of your surgery.   Clear liquids allowed are: Water, Non-Citrus Juices (without pulp), Carbonated Beverages, Clear Tea (no milk, honey, etc.), Black Coffee Only (NO MILK, CREAM OR POWDERED CREAMER of any kind), and Gatorade.    Take these medicines the morning of surgery with A SIP OF WATER: Carvedilol (Coreg) Levothyroxine (Synthroid) Valacyclovir (Valtrex)   May take these medicines IF NEEDED: Nitroglycerin - please call [phone] after taking this medication    One week prior to surgery, STOP taking any Aspirin (unless otherwise instructed by your surgeon) Aleve, Naproxen, Ibuprofen, Motrin, Advil, Goody's, BC's, all herbal medications, fish oil, and non-prescription vitamins.                     Do NOT Smoke (Tobacco/Vaping) for 24 hours prior to your procedure.  If you use a CPAP at night, you may bring your mask/headgear for your overnight stay.   You will be asked to remove any contacts, glasses, piercing's, hearing aid's, dentures/partials prior to surgery. Please bring cases for these items if needed.    Patients discharged the day of surgery will not be allowed to drive home, and someone needs to stay with them for 24 hours.  SURGICAL WAITING ROOM VISITATION Patients may have no more than 2 support people in the waiting area - these visitors may  rotate.   Pre-op nurse will coordinate an appropriate time for 1 ADULT support person, who may not rotate, to accompany patient in pre-op.  Children under the age of 63 must have an adult with them who is not the patient and must remain in the main waiting area with an adult.  If the patient needs to stay at the hospital during part of their recovery, the visitor guidelines for inpatient rooms apply.  Please refer to the [facility] website for the visitor guidelines for any additional information.   If you received a COVID test during your pre-op visit  it is requested that you wear a mask when out in public, stay away from anyone that may not be feeling well and notify your surgeon if you develop symptoms. If you have been in contact with anyone that has tested positive in the last 10 days please notify you surgeon.      Pre-operative CHG Bathing Instructions   You can play a key role in reducing the risk of infection after surgery. Your skin needs to be as free of germs as possible. You can reduce the number of germs on your skin by washing with CHG (chlorhexidine gluconate) soap before surgery. CHG is an antiseptic soap that kills germs and continues to kill germs even after washing.   DO NOT use if you have an allergy to chlorhexidine/CHG or antibacterial soaps. If your skin becomes reddened or irritated, stop using the CHG and notify one of our RNs at [phone].  TAKE A SHOWER THE NIGHT BEFORE SURGERY AND THE DAY OF SURGERY    Please keep in mind the following:  DO NOT shave, including legs and underarms, 48 hours prior to surgery.   You may shave your face before/day of surgery.  Place clean sheets on your bed the night before surgery Use a clean washcloth (not used since being washed) for each shower. DO NOT sleep with pet's night before surgery.  CHG Shower Instructions:  Wash your face and private area with normal soap. If you choose to wash your hair, wash  first with your normal shampoo.  After you use shampoo/soap, rinse your hair and body thoroughly to remove shampoo/soap residue.  Turn the water OFF and apply half the bottle of CHG soap to a CLEAN washcloth.  Apply CHG soap ONLY FROM YOUR NECK DOWN TO YOUR TOES (washing for 3-5 minutes)  DO NOT use CHG soap on face, private areas, open wounds, or sores.  Pay special attention to the area where your surgery is being performed.  If you are having back surgery, having someone wash your back for you may be helpful. Wait 2 minutes after CHG soap is applied, then you may rinse off the CHG soap.  Pat dry with a clean towel  Put on clean pajamas    Additional instructions for the day of surgery: DO NOT APPLY any lotions, deodorants, cologne, or perfumes.   Do not wear jewelry or makeup Do not wear nail polish, gel polish, artificial nails, or any other type of covering on natural nails (fingers and toes) Do not bring valuables to the hospital. [facility] is not responsible for valuables/personal belongings. Put on clean/comfortable clothes.  Please brush your teeth.  Ask your nurse before applying any prescription medications to the skin.

## 2023-11-29 ENCOUNTER — Encounter (HOSPITAL_COMMUNITY): Payer: Self-pay

## 2023-11-29 ENCOUNTER — Encounter: Payer: Self-pay | Admitting: *Deleted

## 2023-11-29 ENCOUNTER — Other Ambulatory Visit: Payer: Self-pay

## 2023-11-29 ENCOUNTER — Encounter (HOSPITAL_COMMUNITY)
Admission: RE | Admit: 2023-11-29 | Discharge: 2023-11-29 | Disposition: A | Discharge status: Home / Self Care | Source: Ambulatory Visit | Attending: Surgery | Admitting: Surgery

## 2023-11-29 VITALS — BP 139/70 | HR 77 | Temp 98.3°F | Resp 17 | Ht 67.0 in | Wt 128.4 lb

## 2023-11-29 DIAGNOSIS — I472 Ventricular tachycardia, unspecified: Secondary | ICD-10-CM | POA: Diagnosis not present

## 2023-11-29 DIAGNOSIS — I080 Rheumatic disorders of both mitral and aortic valves: Secondary | ICD-10-CM | POA: Diagnosis not present

## 2023-11-29 DIAGNOSIS — Z01818 Encounter for other preprocedural examination: Secondary | ICD-10-CM

## 2023-11-29 DIAGNOSIS — I10 Essential (primary) hypertension: Secondary | ICD-10-CM | POA: Insufficient documentation

## 2023-11-29 DIAGNOSIS — C50911 Malignant neoplasm of unspecified site of right female breast: Secondary | ICD-10-CM | POA: Diagnosis not present

## 2023-11-29 DIAGNOSIS — E0500 Thyrotoxicosis with diffuse goiter without thyrotoxic crisis or storm: Secondary | ICD-10-CM | POA: Diagnosis not present

## 2023-11-29 DIAGNOSIS — Z01812 Encounter for preprocedural laboratory examination: Secondary | ICD-10-CM | POA: Diagnosis not present

## 2023-11-29 DIAGNOSIS — Z87891 Personal history of nicotine dependence: Secondary | ICD-10-CM | POA: Insufficient documentation

## 2023-11-29 DIAGNOSIS — Z79899 Other long term (current) drug therapy: Secondary | ICD-10-CM | POA: Diagnosis not present

## 2023-11-29 DIAGNOSIS — E039 Hypothyroidism, unspecified: Secondary | ICD-10-CM | POA: Diagnosis not present

## 2023-11-29 DIAGNOSIS — I471 Supraventricular tachycardia, unspecified: Secondary | ICD-10-CM | POA: Insufficient documentation

## 2023-11-29 HISTORY — DX: Hypothyroidism, unspecified: E03.9

## 2023-11-29 LAB — BASIC METABOLIC PANEL WITH GFR
Anion gap: 8 (ref 5–15)
BUN: 17 mg/dL (ref 8–23)
CO2: 27 mmol/L (ref 22–32)
Calcium: 9.1 mg/dL (ref 8.9–10.3)
Chloride: 105 mmol/L (ref 98–111)
Creatinine, Ser: 0.83 mg/dL (ref 0.44–1.00)
GFR, Estimated: >60 mL/min (ref >60)
Glucose, Bld: 98 mg/dL (ref 70–99)
Potassium: 4.4 mmol/L (ref 3.5–5.1)
Sodium: 140 mmol/L (ref 135–145)

## 2023-11-29 LAB — CBC
HCT: 38.6 % (ref 36.0–46.0)
Hemoglobin: 12.2 g/dL (ref 12.0–15.0)
MCH: 29.8 pg (ref 26.0–34.0)
MCHC: 31.6 g/dL (ref 30.0–36.0)
MCV: 94.4 fL (ref 80.0–100.0)
Platelets: 274 10*3/uL (ref 150–400)
RBC: 4.09 MIL/uL (ref 3.87–5.11)
RDW: 13.4 % (ref 11.5–15.5)
WBC: 5.9 10*3/uL (ref 4.0–10.5)
nRBC: 0 % (ref 0.0–0.2)

## 2023-11-29 NOTE — Progress Notes (Signed)
 PCP - Henrine Screws, MD  Cardiologist - MD Christell Constant   PPM/ICD - denies Device Orders - n/a Rep Notified - n/a  Chest x-ray -  EKG - 11-12-23 Stress Test - 01-03-22 ECHO - 01-23-23 Cardiac Cath -   Sleep Study - denies CPAP -   DM -denies  Blood Thinner Instructions: denies Aspirin Instructions: n/a  ERAS Protcol - Clear liquids till 8:30  PRE-SURGERY Ensure  COVID TEST- n/a   Anesthesia review: yes  Patient denies shortness of breath, fever, cough and chest pain at PAT appointment   All instructions explained to the patient, with a verbal understanding of the material. Patient agrees to go over the instructions while at home for a better understanding. Patient also instructed to self quarantine after being tested for COVID-19. The opportunity to ask questions was provided.

## 2023-12-02 NOTE — Anesthesia Preprocedure Evaluation (Addendum)
 Anesthesia Evaluation  Patient identified by MRN, date of birth, ID band Patient awake    Reviewed: Allergy & Precautions, H&P , NPO status , Patient's Chart, lab work & pertinent test results  Airway Mallampati: II  TM Distance: >3 FB Neck ROM: Full    Dental no notable dental hx. (+) Teeth Intact, Dental Advisory Given   Pulmonary neg pulmonary ROS, Current Smoker   Pulmonary exam normal breath sounds clear to auscultation       Cardiovascular Exercise Tolerance: Good hypertension, + angina  + CAD  negative cardio ROS Normal cardiovascular exam Rhythm:Regular Rate:Normal     Neuro/Psych  Headaches PSYCHIATRIC DISORDERS  Depression    negative neurological ROS  negative psych ROS   GI/Hepatic negative GI ROS, Neg liver ROS,GERD  ,,  Endo/Other  negative endocrine ROSHypothyroidism    Renal/GU negative Renal ROS  negative genitourinary   Musculoskeletal negative musculoskeletal ROS (+) Arthritis , Osteoarthritis,    Abdominal   Peds negative pediatric ROS (+)  Hematology negative hematology ROS (+)   Anesthesia Other Findings   Reproductive/Obstetrics negative OB ROS                             Anesthesia Physical Anesthesia Plan  ASA: 3  Anesthesia Plan: General and Regional   Post-op Pain Management: Celebrex  PO (pre-op)*, Tylenol  PO (pre-op)*, Minimal or no pain anticipated and Regional block*   Induction: Intravenous  PONV Risk Score and Plan: 1 and Ondansetron , Dexamethasone  and Treatment may vary due to age or medical condition  Airway Management Planned: LMA and Oral ETT  Additional Equipment: None  Intra-op Plan:   Post-operative Plan: Extubation in OR  Informed Consent: I have reviewed the patients History and Physical, chart, labs and discussed the procedure including the risks, benefits and alternatives for the proposed anesthesia with the patient or authorized  representative who has indicated his/her understanding and acceptance.       Plan Discussed with: Anesthesiologist and CRNA  Anesthesia Plan Comments: (PAT note written 12/02/2023 by Ella Gun, PA-C. DISCUSSION: Patient is a 78 year old female scheduled for the above procedure.   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   Last cardiology follow-up was on 11/12/23 by Ervin Heath, PA. Patient had a coronary CT on 12/12/2021 which showed calcium score 1443 (99th percentile) with  25 to 49% disease in ostial RCA, 50 to 69% disease in mid RCA, soft plaque in the LAD with distal irregularities, moderate plaque in proximal left circumflex artery.  FFR showed no significant stenosis. Myoview  obtained on 01/03/2022 showed no ischemia, EF 67%, overall low risk study.  She was seen in the ED in May 2024 with syncope.  She was hypotensive at the time and the treated with IVF.  Her lisinopril  and metoprolol  were discontinued.  Echocardiogram obtained on 01/23/2023 showed EF 65 to 70%, no regional wall motion abnormality, moderate LVH, grade 1 DD, RVSP 29.4 mmHg, moderate AI.  Heart monitor showed primarily showed sinus rhythm, 1 run of NSVT, frequent paroxysmal SVT longest 13 seconds. At recent follow-up, BP 162/70. She no further syncope. Carvedilol  6.25 mg BID added with instructions to increase to 12.5 mg BID if SBP > 150 after 2 weeks. Follow-up echo for moderate AI planned for ~ May/June 2025. She was able to  complete > 4 METS. In regards to preoperative clearance, he wrote, "Acceptable to proceed with moderate-risk mastectomy from a cardiac perspective. Blood pressure control required for surgical clearance. -Will need to make sure systolic blood pressure is at least below 160 before  proceeding with surgery." BP 139/70 at 11/29/23 PAT.    Anesthesia team to evaluate on the day of surgery. CV: Echo 01/23/23: IMPRESSIONS   1. Left ventricular ejection fraction, by estimation, is 65 to 70%. The  left ventricle has normal function. The left ventricle has no regional  wall motion abnormalities. There is moderate concentric left ventricular  hypertrophy. Left ventricular  diastolic parameters are consistent with Grade I diastolic dysfunction  (impaired relaxation).   2. Right ventricular systolic function is normal. The right ventricular  size is normal. There is normal pulmonary artery systolic pressure. The  estimated right ventricular systolic pressure is 29.4 mmHg.   3. The mitral valve is normal in structure. No evidence of mitral valve  regurgitation. No evidence of mitral stenosis.   4. The aortic valve is tricuspid. There is mild calcification of the  aortic valve. Aortic valve regurgitation is moderate. No aortic stenosis  is present.   5. The inferior vena cava is normal in size with greater than 50%  respiratory variability, suggesting right atrial pressure of 3 mmHg.      Long term ZioXT monitor 12/28/22 - 01/10/23:   Patient had a minimum heart rate of 54 bpm, maximum heart rate of 182 bpm, and average heart rate of 80 bpm.   Predominant underlying rhythm was sinus rhythm.   One run of NSVT that lasted 6 beats.   Frequent paroxsymal SVT lasting 13 seconds at longest.   Isolated PACs were rare (<1.0%).   Isolated PVCs were rare (<1.0%).   No triggered and diary events. Asymptomatic SVT.     Nuclear stress test 01/03/22:   The study is normal. The study is low risk.   No ST deviation was noted.   LV perfusion is normal. There is no evidence of ischemia. There is no evidence of infarction.   Left ventricular function is normal. Nuclear stress EF: 67 %. The left ventricular ejection fraction is hyperdynamic (>65%). End diastolic cavity size is  normal. End systolic cavity size is normal.     CT Coronary/FFR 12/12/21: IMPRESSION: 1. Coronary calcium score of 1443. This was 21 percentile for age and sex matched control. 2. Normal coronary origin with right dominance. 3. CAD-RADS 3. Moderate stenosis. Consider symptom-guided anti-ischemic pharmacotherapy as well as risk factor modification per guideline directed care. Additional analysis with CT FFR will be submitted.   FFR 12/12/21: 1. Left Main: FFR = 1.00 2. LAD: Proximal FFR = 0.98, mid FFR = 0.95, distal FFR = 0.88 3. LCX: Proximal FFR = 0.98, distal FFR = not analyzed 4. RCA: Proximal FFR = Unable to be analyzed due to significant artifact.   IMPRESSION: 1.  CT FFR analysis showed no significant stenosis. RECOMMENDATIONS: Guideline-directed medical therapy and aggressive risk factor modification for secondary prevention of coronary artery disease.  )       Anesthesia Quick Evaluation

## 2023-12-02 NOTE — Progress Notes (Signed)
 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  Last cardiology follow-up was on 11/12/23 by Azalee Course, PA. Patient had a coronary CT on 12/12/2021 which showed calcium score 1443 (99th percentile) with  25 to 49% disease in ostial RCA, 50 to 69% disease in mid RCA, soft plaque in the LAD with distal irregularities, moderate plaque in proximal left circumflex artery.  FFR showed no significant stenosis. Myoview obtained on 01/03/2022 showed no ischemia, EF 67%, overall low risk study.  She was seen in the ED in May 2024 with syncope.  She was hypotensive at the time and the treated with IVF.  Her lisinopril and metoprolol were discontinued.  Echocardiogram obtained on 01/23/2023 showed EF 65 to 70%, no regional wall motion abnormality, moderate LVH, grade 1 DD, RVSP 29.4 mmHg, moderate AI.  Heart monitor showed primarily showed sinus rhythm, 1 run of NSVT, frequent paroxysmal SVT longest 13 seconds. At recent follow-up, BP 162/70. She no further syncope. Carvedilol 6.25 mg BID added with instructions to increase to 12.5 mg BID if SBP > 150 after 2 weeks. Follow-up echo for moderate AI planned for ~ May/June 2025. She was able to complete > 4 METS. In regards to preoperative clearance, he wrote,  "Acceptable to proceed with moderate-risk mastectomy from a cardiac perspective. Blood pressure control required for surgical clearance. -Will need to make sure systolic blood pressure is at least below 160 before proceeding with surgery." BP 139/70 at 11/29/23 PAT.    Anesthesia team to evaluate on the day of surgery.   VS: BP 139/70   Pulse 77   Temp 36.8 C   Resp 17   Ht 5\' 7"  (1.702 m)   Wt 58.2 kg   SpO2 97%   BMI 20.11 kg/m    PROVIDERS: Henrine Screws, MD is PCP  Riley Lam, MD is cardiologist [place], [place], MD is endocrinologist Serena Croissant, MD is [facility]   LABS: Labs reviewed: Acceptable for surgery. (all labs ordered are listed, but only abnormal results are displayed)  Labs Reviewed  BASIC METABOLIC PANEL WITH GFR  CBC    EKG: 11/12/23: NSR   CV: Echo 01/23/23: IMPRESSIONS   1. Left ventricular ejection fraction, by estimation, is 65 to 70%. The  left ventricle has normal function. The left ventricle has no regional  wall motion abnormalities. There is moderate concentric left ventricular  hypertrophy. Left ventricular  diastolic parameters are consistent with Grade I diastolic dysfunction  (impaired relaxation).   2. Right ventricular systolic function is normal. The right ventricular  size is normal. There is normal pulmonary artery systolic pressure. The  estimated right ventricular systolic pressure is 29.4 mmHg.   3. The mitral valve is normal in structure. No evidence of mitral valve  regurgitation. No evidence of mitral stenosis.   4. The aortic valve is tricuspid. There is mild  calcification of the  aortic valve. Aortic valve regurgitation is moderate. No aortic stenosis  is present.   5. The inferior vena cava is normal in size with greater than 50%  respiratory variability, suggesting right atrial pressure of 3 mmHg.    Long term ZioXT monitor 12/28/22 - 01/10/23:   Patient had a minimum heart rate of 54 bpm, maximum heart rate of  182 bpm, and average heart rate of 80 bpm.   Predominant underlying rhythm was sinus rhythm.   One run of NSVT that lasted 6 beats.   Frequent paroxsymal SVT lasting 13 seconds at longest.   Isolated PACs were rare (<1.0%).   Isolated PVCs were rare (<1.0%).   No triggered and diary events. Asymptomatic SVT.   Nuclear stress test 01/03/22:   The study is normal. The study is low risk.   No ST deviation was noted.   LV perfusion is normal. There is no evidence of ischemia. There is no evidence of infarction.   Left ventricular function is normal. Nuclear stress EF: 67 %. The left ventricular ejection fraction is hyperdynamic (>65%). End diastolic cavity size is normal. End systolic cavity size is normal.   CT Coronary/FFR 12/12/21: IMPRESSION: 1. Coronary calcium score of 1443. This was 61 percentile for age and sex matched control. 2. Normal coronary origin with right dominance. 3. CAD-RADS 3. Moderate stenosis. Consider symptom-guided anti-ischemic pharmacotherapy as well as risk factor modification per guideline directed care. Additional analysis with CT FFR will be submitted.   FFR 12/12/21: 1. Left Main: FFR = 1.00 2. LAD: Proximal FFR = 0.98, mid FFR = 0.95, distal FFR = 0.88 3. LCX: Proximal FFR = 0.98, distal FFR = not analyzed 4. RCA: Proximal FFR = Unable to be analyzed due to significant artifact.   IMPRESSION: 1.  CT FFR analysis showed no significant stenosis. RECOMMENDATIONS: Guideline-directed medical therapy and aggressive risk factor modification for secondary prevention of coronary artery disease.    Past Medical History:  Diagnosis Date   Aortic insufficiency 02/11/2023   TTE 01/23/23: EF 65-70, no RWMA, mod LVH, Gr 1 DD, NL RVSF, NL PASP, RVSP 29.4, mod AI, RAP 3   Breast cancer (HCC) 1997   Depression    GERD (gastroesophageal reflux disease)    Graves disease    Hemorrhoids    Hypertension    Hypothyroidism    Migraines    Osteoporosis    Personal  history of radiation therapy 1998   Vitamin D deficiency     Past Surgical History:  Procedure Laterality Date   BREAST BIOPSY Right 08/11/1996   BREAST BIOPSY Right 08/13/2023   [place] RT BREAST BX W LOC DEV 1ST LESION IMG BX SPEC US GUIDE 08/13/2023 [facility]   BREAST LUMPECTOMY Right 1998   CESAREAN SECTION     CHOLECYSTECTOMY     SPINE SURGERY      MEDICATIONS:  anastrozole (ARIMIDEX) 1 MG tablet   ascorbic acid (VITAMIN C) 500 MG tablet   BLACK CURRANT SEED OIL PO   carvedilol (COREG) 6.25 MG tablet   cholecalciferol (VITAMIN D3) 25 MCG (1000 UNIT) tablet   EPINEPHrine 0.3 mg/0.3 mL IJ SOAJ injection   ezetimibe (ZETIA) 10 MG tablet   levothyroxine (SYNTHROID) 100 MCG tablet   magnesium oxide (MAG-OX) 400 (240 Mg) MG tablet   nitroGLYCERIN (NITROSTAT) 0.4 MG SL tablet   pravastatin (PRAVACHOL) 20 MG tablet   Probiotic Product (PROBIOTIC DAILY) CAPS   valACYclovir (VALTREX) 500 MG tablet   No  current facility-administered medications for this encounter.    Shonna Chock, PA-C Surgical Short Stay/Anesthesiology [facility] Phone [phone] [facility] Phone [phone] 12/02/2023 2:27 PM

## 2023-12-04 ENCOUNTER — Telehealth: Payer: Self-pay | Admitting: Hematology and Oncology

## 2023-12-04 NOTE — Telephone Encounter (Signed)
 Left vm about scheduled appt date and time.

## 2023-12-05 ENCOUNTER — Inpatient Hospital Stay: Payer: Medicare HMO

## 2023-12-05 ENCOUNTER — Inpatient Hospital Stay: Payer: Medicare HMO | Attending: Hematology and Oncology | Admitting: Genetic Counselor

## 2023-12-05 ENCOUNTER — Other Ambulatory Visit (HOSPITAL_COMMUNITY)

## 2023-12-05 NOTE — Progress Notes (Deleted)
 REFERRING PROVIDER: Serena Croissant, MD [address] [place],  [place] [address]  PRIMARY PROVIDER:  Henrine Screws, MD  PRIMARY REASON FOR VISIT:  No diagnosis found.   HISTORY OF PRESENT ILLNESS:   Victoria Blair, a 78 y.o. female, was seen for a [facility] cancer genetics consultation at the request of Dr. Pamelia Hoit due to a personal history of breast cancer.  Victoria Blair presents to clinic today to discuss the possibility of a hereditary predisposition to cancer, to discuss genetic testing, and to further clarify her future cancer risks, as well as potential cancer risks for family members.   In 1998, in her late 19s, Victoria Blair was diagnosed with right breast cancer s/p lumpectomy and radiation.  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  The treatment plan includes mastectomy, adjuvant Herceptin, and anti-estrogen therapy.     CANCER HISTORY:  Oncology History   No history exists.     SCREENING/RISK FACTORS:  Mammogram within the last year: *** Number of breast biopsies: {Numbers 1-12 multi-select:20307}. Colonoscopy: {Yes/No-Ex:120004}; {normal/abnormal/not examined:14677}. Hysterectomy: {Yes/No-Ex:120004}.  Ovaries intact: {Yes/No-Ex:120004}.  Menarche was at age ***.  First live birth at age ***.  Menopausal status: {Menopause:31378}.  OCP use for approximately {Numbers 1-12 multi-select:20307} years.  HRT use: {Numbers 1-12 multi-select:20307} years. History of radiation therapy to breast tissue before age 24: {Yes/No-Ex:120004} Dermatology screening: {Yes/No-Ex:120004}  Blood transfusion within past 4 weeks: {Yes/No-Ex:120004} History of bone marrow transplant: {Yes/No-Ex:120004}   Past Medical History:  Diagnosis Date   Aortic insufficiency 02/11/2023   TTE 01/23/23: EF 65-70, no RWMA, mod LVH, Gr 1 DD, NL RVSF, NL PASP, RVSP 29.4, mod AI, RAP 3   Breast cancer (HCC)  1997   Depression    GERD (gastroesophageal reflux disease)    Graves disease    Hemorrhoids    Hypertension    Hypothyroidism    Migraines    Osteoporosis    Personal history of radiation therapy 1998   Vitamin D deficiency     Past Surgical History:  Procedure Laterality Date   BREAST BIOPSY Right 08/11/1996   BREAST BIOPSY Right 08/13/2023   [place] RT BREAST BX W LOC DEV 1ST LESION IMG BX SPEC US GUIDE 08/13/2023 [facility]   BREAST LUMPECTOMY Right 1998   CESAREAN SECTION     CHOLECYSTECTOMY     SPINE SURGERY      FAMILY HISTORY:  We obtained a detailed, 4-generation family history.  Significant diagnoses are listed below: Family History  Problem Relation Age of Onset   Heart disease Father    Pulmonary fibrosis Father    Hypertension Father    Pancreatic cancer Sister    Diabetes Maternal Aunt    Sleep apnea Son    Breast cancer Neg Hx     Victoria Blair. Pellot is {aware/unaware} of previous family history of genetic testing for hereditary cancer risks. Patient's maternal ancestors are of *** descent, and paternal ancestors are of *** descent. There {IS NO:12509} reported Ashkenazi Jewish ancestry. There {IS NO:12509} known consanguinity.  GENETIC COUNSELING ASSESSMENT: Victoria Blair is a 78 y.o. female with a {Personal/family:20331} history of {cancer/polyps} which is somewhat suggestive of a {DISEASE} and predisposition to cancer given ***. We, therefore, discussed and recommended the following at today's visit.   DISCUSSION: We discussed that *** - ***% of *** is hereditary.  Most cases of *** associated with ***.  There are other genes that can  be associated with hereditary *** cancer syndromes.  These include ***.  We discussed that testing is beneficial for several reasons including knowing how to follow individuals for their cancer risks, identifying whether potential treatment options *** would be beneficial, and understanding if other family members could be at an  increased risk for cancer and allowing them to undergo genetic testing.   We reviewed the characteristics, features and inheritance patterns of hereditary cancer syndromes. We also discussed genetic testing, including the appropriate family members to test, the process of testing, insurance coverage and turn-around-time for results. We discussed the implications of a negative, positive, carrier and/or variant of uncertain significant result. We recommended Victoria Blair pursue genetic testing for a panel that includes genes associated with *** cancer.   Victoria Blair  was offered a common hereditary cancer panel (~40 genes) and an expanded pan-cancer panel (~70 genes). Victoria Blair was informed of the benefits and limitations of each panel, including that expanded pan-cancer panels contain genes that do not have clear management guidelines at this point in time.  We also discussed that as the number of genes included on a panel increases, the chances of variants of uncertain significance increases.  After considering the benefits and limitations of each gene panel, Victoria Blair  elected to have *** through ***.   Based on Victoria Blair's {Personal/family:20331} history of cancer, she meets medical criteria for genetic testing. Despite that she meets criteria, she may still have an out of pocket cost. We discussed that if her out of pocket cost for testing is over $100, the laboratory should contact her and discuss the self-pay prices and/or patient pay assistance programs.    ***We reviewed the characteristics, features and inheritance patterns of hereditary cancer syndromes. We also discussed genetic testing, including the appropriate family members to test, the process of testing, insurance coverage and turn-around-time for results. We discussed the implications of a negative, positive and/or variant of uncertain significant result. In order to get genetic test results in a timely manner so that Victoria Blair.  Blair can use these genetic test results for surgical decisions, we recommended Victoria Blair pursue genetic testing for the ***. Once complete, we recommend Victoria Blair pursue reflex genetic testing to the *** gene panel.   Based on Victoria Blair's {Personal/family:20331} history of cancer, she meets medical criteria for genetic testing. Despite that she meets criteria, she may still have an out of pocket cost.   ***We discussed with Victoria Blair that the {Personal/family:20331} history does not meet insurance or NCCN criteria for genetic testing and, therefore, is not highly consistent with a familial hereditary cancer syndrome.  We feel she is at low risk to harbor a gene mutation associated with such a condition. Thus, we did not recommend any genetic testing, at this time, and recommended Victoria Blair continue to follow the cancer screening guidelines given by her primary healthcare provider.  ***In order to estimate her chance of having a {CA GENE:62345} mutation, we used statistical models ({GENMODELS:62370}) that consider her personal medical history, family history and ancestry.  Because each model is different, there can be a lot of variability in the risks they give.  Therefore, these numbers must be considered a rough range and not a precise risk of having a {CA GENE:62345} mutation.  These models estimate that she has approximately a ***-***% chance of having a mutation. Based on this assessment of her family and personal history, genetic testing {IS/ISNOT:34056} recommended.  ***Based on the patient's {Personal/family:20331} history, a  statistical model ({GENMODELS:62370}) was used to estimate her risk of developing {CA HX:54794}. This estimates her lifetime risk of developing {CA HX:54794} to be approximately ***%. This estimation does not consider any genetic testing results.  The patient's lifetime breast cancer risk is a preliminary estimate based on available information using one  of several models endorsed by the American Cancer Society (ACS). The ACS recommends consideration of breast MRI screening as an adjunct to mammography for patients at high risk (defined as 20% or greater lifetime risk).   ***Victoria Blair has been determined to be at high risk for breast cancer.  Therefore, we recommend that annual screening with mammography and breast MRI be performed.  ***begin at age 56, or 10 years prior to the age of breast cancer diagnosis in a relative (whichever is earlier).  We discussed that Victoria Blair should discuss her individual situation with her referring physician and determine a breast cancer screening plan with which they are both comfortable.    PLAN: After considering the risks, benefits, and limitations, Victoria Blair provided informed consent to pursue genetic testing and the blood sample was sent to {Lab} Laboratories for analysis of the {test}. Results should be available within approximately {TAT TIME} weeks, at which point they will be disclosed by telephone*** to Victoria Blair. Ige, as will any additional recommendations warranted by these results. Victoria Blair. Caraveo will receive a summary of her genetic counseling visit and a copy of her results once available. This information will also be available in Epic.   *** Despite our recommendation, Victoria Blair. Kuhnert did not wish to pursue genetic testing at today's visit. We understand this decision and remain available to coordinate genetic testing at any time in the future. We, therefore, recommend Victoria Blair. Wanner continue to follow the cancer screening guidelines given by her primary healthcare provider.  ***Based on Victoria Blair. Silveira's family history, we recommended her ***, who was diagnosed with *** at age ***, have genetic counseling and testing. Victoria Blair. Kasparian can let us know if we can be of any assistance in coordinating genetic counseling and/or testing for appropriate relatives.   Lastly, we encouraged Victoria Blair. Heuberger to  remain in contact with cancer genetics annually so that we can continuously update the family history and inform her of any changes in cancer genetics and testing that may be of benefit for this family.   Victoria Blair. Rouser questions were answered to her satisfaction today. Our contact information was provided should additional questions or concerns arise. Thank you for the referral and allowing [place] to share in the care of your patient.   Romuald Mccaslin M. Rennie Plowman, Victoria Blair, [facility] Genetic Counselor [email or web address] (P) [phone]   *** minutes were spent on the date of the encounter in service to the patient including preparation, face-to-face consultation, documentation and care coordination.  ***The patient was accompanied by ***.  ***The patient was seen alone.  Drs. Gunnar Bulla and/or Mosetta Putt were available to discuss this case as needed.    _______________________________________________________________________ For Office Staff:  Number of people involved in session: *** Was an Intern/ student involved with case: {YES/NO:63}

## 2023-12-09 ENCOUNTER — Telehealth: Payer: Self-pay | Admitting: Licensed Clinical Social Worker

## 2023-12-09 NOTE — Telephone Encounter (Signed)
 [facility] Clinical Social Work  CSW received VM from patient with only her phone number and no reason for call.  Attempted to call pt back to assess for needs. No answer. Left VM with direct contact information.    Adaliz Dobis E Caitland Porchia, LCSW

## 2023-12-10 ENCOUNTER — Encounter: Payer: Self-pay | Admitting: *Deleted

## 2023-12-11 ENCOUNTER — Inpatient Hospital Stay: Payer: Medicare HMO | Admitting: Adult Health

## 2023-12-12 ENCOUNTER — Encounter: Payer: Self-pay | Admitting: Physician Assistant

## 2023-12-12 ENCOUNTER — Telehealth: Payer: Self-pay | Admitting: Hematology and Oncology

## 2023-12-12 ENCOUNTER — Ambulatory Visit: Attending: Physician Assistant | Admitting: Physician Assistant

## 2023-12-12 VITALS — BP 168/78 | HR 61 | Ht 67.0 in | Wt 129.0 lb

## 2023-12-12 DIAGNOSIS — I351 Nonrheumatic aortic (valve) insufficiency: Secondary | ICD-10-CM | POA: Diagnosis not present

## 2023-12-12 DIAGNOSIS — I2510 Atherosclerotic heart disease of native coronary artery without angina pectoris: Secondary | ICD-10-CM | POA: Diagnosis not present

## 2023-12-12 DIAGNOSIS — I10 Essential (primary) hypertension: Secondary | ICD-10-CM

## 2023-12-12 MED ORDER — IRBESARTAN 150 MG PO TABS: 150.0000 mg | ORAL_TABLET | Freq: Every day | ORAL | 3 refills | Status: AC

## 2023-12-12 NOTE — Patient Instructions (Signed)
 Medication Instructions:  START IRBESARTAN 150 MG DAILY *If you need a refill on your cardiac medications before your next appointment, please call your pharmacy*  Lab Work: NO LABS If you have labs (blood work) drawn today and your tests are completely normal, you will receive your results only by: MyChart Message (if you have MyChart) OR A paper copy in the mail If you have any lab test that is abnormal or we need to change your treatment, we will call you to review the results.  Testing/Procedures: NO TESTING  Follow-Up: At [facility], you and your health needs are our priority.  As part of our continuing mission to provide you with exceptional heart care, our providers are all part of one team.  This team includes your primary Cardiologist (physician) and Advanced Practice Providers or APPs (Physician Assistants and Nurse Practitioners) who all work together to provide you with the care you need, when you need it.  Your next appointment:   3-4 month(s)  Provider:   Jann Melody, MD   Other Instructions   1st Floor: - Lobby - Registration  - Pharmacy  - Lab - Cafe  2nd Floor: - PV Lab - Diagnostic Testing (echo, CT, nuclear med)  3rd Floor: - Vacant  4th Floor: - TCTS (cardiothoracic surgery) - AFib Clinic - Structural Heart Clinic - Vascular Surgery  - Vascular Ultrasound  5th Floor: - [facility] Cardiology (general and EP) - Clinical Pharmacy for coumadin, hypertension, lipid, weight-loss medications, and med management appointments    Valet parking services will be available as well.

## 2023-12-12 NOTE — Telephone Encounter (Signed)
 Left vm about rescheduled appt date and time.

## 2023-12-12 NOTE — Progress Notes (Signed)
 Cardiology Office Note:  .   Date:  12/12/2023  ID:  Victoria Blair, DOB 23-Jul-1946, MRN [id number] PCP: Ruven Coy, MD  [facility] Providers Cardiologist:  Jann Melody, MD    History of Present Illness: .   Victoria Blair is a 78 y.o. female  with past medical history of CAD, aortic insufficiency, hypertension, hyperlipidemia intolerant of statins, tobacco use, breast cancer s/p radiation and history of syncope.  Patient previously had a coronary CT on 12/12/2021 which showed calcium score 1443 which places her at 99th percentile, 25 to 49% disease in ostial RCA, 50 to 69% disease in mid RCA, soft plaque in the LAD with distal irregularities, moderate plaque in proximal left circumflex artery.  FFR was normal.  Myoview  obtained on 01/03/2022 showed no ischemia, EF 67%, overall low risk study.  She was seen in the ED in May 2024 with syncope.  She was hypotensive at the time and the treated with IV fluid.  Her lisinopril  and metoprolol  were discontinued.  Echocardiogram obtained on 01/23/2023 showed EF 65 to 70%, no regional wall motion abnormality, moderate LVH, grade 1 DD, RVSP 29.4 mmHg, moderate AI.  Heart monitor showed primarily showed sinus rhythm, 1 run of NSVT, frequent paroxysmal SVT longest 13 seconds.  She was last seen by Marlyse Single in June 2024 at which time she was doing well without any further recurrent syncope.  Unfortunately, her blood pressure medication were all stopped in 2024 when she had syncope.   I last saw the patient on 11/12/2023 for preop clearance of her multiple no-shows for cardiology visits.  She has no problem accomplishing more than 4 METS of activity.  I felt she was at acceptable risk to proceed with surgery from the cardiac perspective without further workup.  Her blood pressure was high, therefore I added carvedilol  6.25 mg twice a day to her medical regimen.  She has been instructed to increase carvedilol  further to 12.5 mg twice a day  after 2 weeks if systolic blood pressures remain 150 or higher.  She has an upcoming mastectomy scheduled for 12/18/2023.   Patient presents today for follow-up.  Blood pressure remain elevated on arrival, she is still taking 6.25 mg twice a day carvedilol .  Manual blood pressure by myself was 168/78.  I will add irbesartan  150 mg daily to her medical regimen.  She denies any chest pain or shortness of breath.  I previously already cleared her for the upcoming surgery.  Ideally her systolic blood pressure should be less than 160 before she go through with the surgery.  She can follow-up with Dr. Annabelle Barrack in 3 to 4 months.    ROS:   She denies chest pain, palpitations, dyspnea, pnd, orthopnea, n, v, dizziness, syncope, edema, weight gain, or early satiety. All other systems reviewed and are otherwise negative except as noted above.    Studies Reviewed: .        Cardiac Studies & Procedures   ______________________________________________________________________________________________   STRESS TESTS  MYOCARDIAL PERFUSION IMAGING 01/03/2022  Narrative   The study is normal. The study is low risk.   No ST deviation was noted.   LV perfusion is normal. There is no evidence of ischemia. There is no evidence of infarction.   Left ventricular function is normal. Nuclear stress EF: 67 %. The left ventricular ejection fraction is hyperdynamic (>65%). End diastolic cavity size is normal. End systolic cavity size is normal.   ECHOCARDIOGRAM  ECHOCARDIOGRAM COMPLETE 01/23/2023  Narrative  ECHOCARDIOGRAM REPORT    Patient Name:   Victoria Blair Date of Exam: 01/23/2023 Medical Rec #:  [id number]          Height:       67.5 in Accession #:    [id number]         Weight:       124.0 lb Date of Birth:  10/26/1945           BSA:          1.660 m Patient Age:    76 years           BP:           130/60 mmHg Patient Gender: F                  HR:           83 bpm. Exam Location:  [address]  Procedure: 2D Echo, Cardiac Doppler and Color Doppler  Indications:    R55 Syncope  History:        Patient has prior history of Echocardiogram examinations, most recent 12/11/2021. Risk Factors:Hypertension. History of breast cancer.  Sonographer:    Brigid Canada RDCS Referring Phys: [id number] Debria Fang  IMPRESSIONS   1. Left ventricular ejection fraction, by estimation, is 65 to 70%. The left ventricle has normal function. The left ventricle has no regional wall motion abnormalities. There is moderate concentric left ventricular hypertrophy. Left ventricular diastolic parameters are consistent with Grade I diastolic dysfunction (impaired relaxation). 2. Right ventricular systolic function is normal. The right ventricular size is normal. There is normal pulmonary artery systolic pressure. The estimated right ventricular systolic pressure is 29.4 mmHg. 3. The mitral valve is normal in structure. No evidence of mitral valve regurgitation. No evidence of mitral stenosis. 4. The aortic valve is tricuspid. There is mild calcification of the aortic valve. Aortic valve regurgitation is moderate. No aortic stenosis is present. 5. The inferior vena cava is normal in size with greater than 50% respiratory variability, suggesting right atrial pressure of 3 mmHg.  FINDINGS Left Ventricle: Left ventricular ejection fraction, by estimation, is 65 to 70%. The left ventricle has normal function. The left ventricle has no regional wall motion abnormalities. The left ventricular internal cavity size was normal in size. There is moderate concentric left ventricular hypertrophy. Left ventricular diastolic parameters are consistent with Grade I diastolic dysfunction (impaired relaxation).  Right Ventricle: The right ventricular size is normal. No increase in right ventricular wall thickness. Right ventricular systolic function is normal. There is normal pulmonary artery systolic  pressure. The tricuspid regurgitant velocity is 2.57 m/s, and with an assumed right atrial pressure of 3 mmHg, the estimated right ventricular systolic pressure is 29.4 mmHg.  Left Atrium: Left atrial size was normal in size.  Right Atrium: Right atrial size was normal in size.  Pericardium: There is no evidence of pericardial effusion.  Mitral Valve: The mitral valve is normal in structure. There is mild calcification of the mitral valve leaflet(s). Mild mitral annular calcification. No evidence of mitral valve regurgitation. No evidence of mitral valve stenosis.  Tricuspid Valve: The tricuspid valve is normal in structure. Tricuspid valve regurgitation is mild.  Aortic Valve: The aortic valve is tricuspid. There is mild calcification of the aortic valve. Aortic valve regurgitation is moderate. Aortic regurgitation PHT measures 323 msec. No aortic stenosis is present.  Pulmonic Valve: The pulmonic valve was normal in structure. Pulmonic valve regurgitation is not visualized.  Aorta: The aortic root is normal in size and structure.  Venous: The inferior vena cava is normal in size with greater than 50% respiratory variability, suggesting right atrial pressure of 3 mmHg.  IAS/Shunts: No atrial level shunt detected by color flow Doppler.   LEFT VENTRICLE PLAX 2D LVIDd:         3.90 cm   Diastology LVIDs:         1.80 cm   LV e' medial:    5.08 cm/s LV PW:         1.00 cm   LV E/e' medial:  11.4 LV IVS:        1.00 cm   LV e' lateral:   5.77 cm/s LVOT diam:     1.90 cm   LV E/e' lateral: 10.0 LV SV:         70 LV SV Index:   42 LVOT Area:     2.84 cm   RIGHT VENTRICLE RV Basal diam:  3.60 cm RV S prime:     16.07 cm/s TAPSE (M-mode): 2.9 cm  LEFT ATRIUM             Index        RIGHT ATRIUM           Index LA diam:        3.10 cm 1.87 cm/m   RA Area:     12.50 cm LA Vol (A2C):   21.9 ml 13.20 ml/m  RA Volume:   29.40 ml  17.72 ml/m LA Vol (A4C):   26.8 ml 16.15 ml/m LA  Biplane Vol: 26.2 ml 15.79 ml/m AORTIC VALVE LVOT Vmax:   118.33 cm/s LVOT Vmean:  80.400 cm/s LVOT VTI:    0.245 m AI PHT:      323 msec  AORTA Ao Root diam: 3.20 cm Ao Asc diam:  3.40 cm  MITRAL VALVE                TRICUSPID VALVE MV Area (PHT): 3.21 cm     TR Peak grad:   26.4 mmHg MV Decel Time: 236 msec     TR Vmax:        257.00 cm/s MV E velocity: 57.65 cm/s MV A velocity: 100.50 cm/s  SHUNTS MV E/A ratio:  0.57         Systemic VTI:  0.25 m Systemic Diam: 1.90 cm  Dalton McleanMD Electronically signed by Archer Bear Signature Date/Time: 01/23/2023/3:57:37 PM    Final    MONITORS  LONG TERM MONITOR (3-14 DAYS) 01/22/2023  Narrative   Patient had a minimum heart rate of 54 bpm, maximum heart rate of 182 bpm, and average heart rate of 80 bpm.   Predominant underlying rhythm was sinus rhythm.   One run of NSVT that lasted 6 beats.   Frequent paroxsymal SVT lasting 13 seconds at longest.   Isolated PACs were rare (<1.0%).   Isolated PVCs were rare (<1.0%).   No triggered and diary events.  Asymptomatic SVT.   CT SCANS  CT CORONARY FRACTIONAL FLOW RESERVE DATA PREP 12/12/2021  Narrative EXAM: CT FFR ANALYSIS  CLINICAL DATA:  Coronary artery disease (CAD) (Ped 0-17y)  FINDINGS: FFRct analysis was performed on the original cardiac CT angiogram dataset. Diagrammatic representation of the FFRct analysis is provided in a separate PDF document in PACS. This dictation was created using the PDF document and an interactive 3D model of the results. 3D model is not available in the EMR/PACS. Normal  FFR range is >0.80. Indeterminate (grey) zone is 0.76-0.80.  1. Left Main: FFR = 1.00  2. LAD: Proximal FFR = 0.98, mid FFR = 0.95, distal FFR = 0.88 3. LCX: Proximal FFR = 0.98, distal FFR = not analyzed 4. RCA: Proximal FFR = Unable to be analyzed due to significant artifact.  IMPRESSION: 1.  CT FFR analysis showed no significant  stenosis.  RECOMMENDATIONS: Guideline-directed medical therapy and aggressive risk factor modification for secondary prevention of coronary artery disease.   Electronically Signed By: Kardie  Tobb D.O. On: 12/14/2021 19:59   CT CORONARY MORPH W/CTA COR W/SCORE 12/12/2021  Addendum 12/12/2021  3:06 PM ADDENDUM REPORT: 12/12/2021 15:04  CLINICAL DATA:  This is a 77 year old female with anginal symptoms.  EXAM: Cardiac/Coronary  CTA  TECHNIQUE: The patient was scanned on a [employer].  FINDINGS: A 100 kV prospective scan was triggered in the descending thoracic aorta at 111 HU's. Axial non-contrast 3 mm slices were carried out through the heart. The data set was analyzed on a dedicated work station and scored using the Agatson method. Gantry rotation speed was 250 msecs and collimation was .6 mm. No beta blockade and 0.8 mg of sl NTG was given. The 3D data set was reconstructed in 5% intervals of the 67-82 % of the R-R cycle. Diastolic phases were analyzed on a dedicated work station using MPR, MIP and VRT modes. The patient received 80 cc of contrast.  Image Quality: Fair, with misregistration artifact.  Aorta: Normal size.  No calcifications.  No dissection.  Aortic Valve:  Trileaflet.  No calcifications.  Coronary Arteries:  Normal coronary origin.  Right dominance.  RCA is a large dominant artery that gives rise to PDA and PLA. The RCA is diffusely calcified. There is a long (27 mm) calcified plaque which starts as a mild (25-49%) in the ostium of the RCA and terminates as a moderate (50-69%) plaque. The mid RCA with a moderate calcified plaque in the mid RCA. Due to multiple stair-step artifact the distal RCA is not well visualized- there is calcified plaque present that can not be accurately quantified.  Left main is a large artery that gives rise to LAD and LCX arteries.  LAD is a large vessel. The proximal LAD with mild calcified lesions. The mid  LAD with 2 separate plaques: There is a focal mild soft plaque and right below this is a long 14.4 mm moderate calcified plaque. The distal LAD with minimal luminal irregularities. D1 with mild calcified plaque in the proximal portion of the vessel.  LCX is a non-dominant artery that gives rise to one large OM1 branch. There is moderate mixed plaque in the proximal LCX. The mid and distal LCX with no plaques.  Coronary Calcium Score:  Left main: 0  Left anterior descending artery: 540  Left circumflex artery: 49  Right coronary artery: 854  Total: 1443  Percentile: 99  Other findings:  Normal pulmonary vein drainage into the left atrium.  Normal left atrial appendage without a thrombus.  Normal size of the pulmonary artery.  Mild mitral annular calcification  IMPRESSION: 1. Coronary calcium score of 1443. This was 33 percentile for age and sex matched control.  2. Normal coronary origin with right dominance.  3. CAD-RADS 3. Moderate stenosis. Consider symptom-guided anti-ischemic pharmacotherapy as well as risk factor modification per guideline directed care. Additional analysis with CT FFR will be submitted.  The noncardiac portion of this study will be interpreted in separate report  by the radiologist.   Electronically Signed By: Kardie  Tobb D.O. On: 12/12/2021 15:04  Narrative EXAM: OVER-READ INTERPRETATION  CT CHEST  The following report is an over-read performed by radiologist Dr. Avelino Lek of [facility] on 12/12/2021. This over-read does not include interpretation of cardiac or coronary anatomy or pathology. The coronary calcium score/coronary CTA interpretation by the cardiologist is attached.  COMPARISON:  Multiple priors, most recent chest CT dated January 25, 2017  FINDINGS: Vascular: Normal heart size. Normal caliber thoracic aorta with moderate atherosclerotic disease. No suspicious filling defects of the visualized  pulmonary arteries.  Mediastinum/Nodes: Esophagus is unremarkable. No pathologically enlarged lymph nodes seen in the chest.  Lungs/Pleura: Central airways are patent. Right lower lobe patchy consolidations. Left lower lobe nodule measuring 5 mm on series 11, image 30, unchanged size of compared with priors dating back to 2015, favored to be benign. Centrilobular emphysema. No evidence of pleural effusion or pneumothorax.  Upper Abdomen: No acute abnormality.  Musculoskeletal: No chest wall mass or suspicious bone lesions identified.  IMPRESSION: 1. Patchy right lower lobe consolidations, likely due to infection or aspiration. Recommend follow-up chest CT in 3 months to ensure resolution. 2. Aortic Atherosclerosis (ICD10-I70.0) and Emphysema (ICD10-J43.9).  Electronically Signed: By: Avelino Lek M.D. On: 12/12/2021 12:14     ______________________________________________________________________________________________      Risk Assessment/Calculations:     HYPERTENSION CONTROL Vitals:   12/12/23 1409 12/12/23 1433  BP: (!) 200/78 (!) 168/78    The patient's blood pressure is elevated above target today.  In order to address the patient's elevated BP: A new medication was prescribed today.          Physical Exam:   VS:  BP (!) 168/78   Pulse 61   Ht 5\' 7"  (1.702 m)   Wt 129 lb (58.5 kg)   SpO2 97%   BMI 20.20 kg/m    Wt Readings from Last 3 Encounters:  12/12/23 129 lb (58.5 kg)  11/29/23 128 lb 6.4 oz (58.2 kg)  11/12/23 130 lb 12.8 oz (59.3 kg)    GEN: Well nourished, well developed in no acute distress NECK: No JVD; No carotid bruits CARDIAC: RRR, no murmurs, rubs, gallops RESPIRATORY:  Clear to auscultation without rales, wheezing or rhonchi  ABDOMEN: Soft, non-tender, non-distended EXTREMITIES:  No edema; No deformity   ASSESSMENT AND PLAN: .    Hypertension Uncontrolled hypertension with current carvedilol  regimen. Blood pressure 168/78  mmHg. - Continue carvedilol  6.25 mg twice daily. - Add irbesartan  150 mg daily. - Instruct her to monitor blood pressure at home and report if systolic pressure is below 100 mmHg. - Ensure systolic blood pressure is less than 160 mmHg before surgery.  Coronary Artery Disease (CAD) CAD with significant plaque burden, no ischemia noted in May 2023. Cleared for surgery from cardiac perspective, contingent on blood pressure control. - Monitor cardiac status and ensure blood pressure control prior to surgery. - acceptable risk to proceed with surgery from cardiac perspective  Moderate aortic insufficiency - Ensure echocardiogram is performed around June to assess heart valve status.       Dispo: Follow-up with Dr. Annabelle Barrack in 3 to 4 months  Signed, Christifer Chapdelaine, [place]

## 2023-12-16 DIAGNOSIS — C50911 Malignant neoplasm of unspecified site of right female breast: Secondary | ICD-10-CM | POA: Diagnosis not present

## 2023-12-17 ENCOUNTER — Other Ambulatory Visit: Payer: Self-pay

## 2023-12-17 ENCOUNTER — Ambulatory Visit: Payer: Self-pay | Admitting: Surgery

## 2023-12-17 ENCOUNTER — Encounter: Payer: Self-pay | Admitting: *Deleted

## 2023-12-17 ENCOUNTER — Encounter (HOSPITAL_COMMUNITY): Payer: Self-pay | Admitting: Surgery

## 2023-12-17 NOTE — Pre-Procedure Instructions (Signed)
 Surgical Instructions   Your procedure is scheduled on 4/23 Report to [facility] Main Entrance "A" at 05:30 A.M., then check in with the Admitting office. Any questions or running late day of surgery: call [phone]  Questions prior to your surgery date: call [phone], Monday-Friday, 8am-4pm. If you experience any cold or flu symptoms such as cough, fever, chills, shortness of breath, etc. between now and your scheduled surgery, please notify us  at the above number.     Remember:  Do not eat after midnight the night before your surgery   You may drink clear liquids until 04:30 AM the morning of your surgery.   Clear liquids allowed are: Water, Non-Citrus Juices (without pulp), Carbonated Beverages, Clear Tea (no milk, honey, etc.), Black Coffee Only (NO MILK, CREAM OR POWDERED CREAMER of any kind), and Gatorade.    Take these medicines the morning of surgery with A SIP OF WATER: Carvedilol  (Coreg ) Levothyroxine  (Synthroid ) anastrozole  (ARIMIDEX )   May take these medicines IF NEEDED: Nitroglycerin  - please call [phone] after taking this medication    One week prior to surgery, STOP taking any Aspirin  (unless otherwise instructed by your surgeon) Aleve, Naproxen, Ibuprofen, Motrin, Advil, Goody's, BC's, all herbal medications, fish oil, and non-prescription vitamins.                     Do NOT Smoke (Tobacco/Vaping) for 24 hours prior to your procedure.  If you use a CPAP at night, you may bring your mask/headgear for your overnight stay.   You will be asked to remove any contacts, glasses, piercing's, hearing aid's, dentures/partials prior to surgery. Please bring cases for these items if needed.    Patients discharged the day of surgery will not be allowed to drive home, and someone needs to stay with them for 24 hours.  SURGICAL WAITING ROOM VISITATION Patients may have no more than 2 support people in the waiting area - these visitors may rotate.   Pre-op nurse  will coordinate an appropriate time for 1 ADULT support person, who may not rotate, to accompany patient in pre-op.  Children under the age of 33 must have an adult with them who is not the patient and must remain in the main waiting area with an adult.  If the patient needs to stay at the hospital during part of their recovery, the visitor guidelines for inpatient rooms apply.  Please refer to the [facility] website for the visitor guidelines for any additional information.   If you received a COVID test during your pre-op visit  it is requested that you wear a mask when out in public, stay away from anyone that may not be feeling well and notify your surgeon if you develop symptoms. If you have been in contact with anyone that has tested positive in the last 10 days please notify you surgeon.      Pre-operative CHG Bathing Instructions   You can play a key role in reducing the risk of infection after surgery. Your skin needs to be as free of germs as possible. You can reduce the number of germs on your skin by washing with CHG (chlorhexidine  gluconate) soap before surgery. CHG is an antiseptic soap that kills germs and continues to kill germs even after washing.   DO NOT use if you have an allergy to chlorhexidine /CHG or antibacterial soaps. If your skin becomes reddened or irritated, stop using the CHG and notify one of our RNs at [phone].  TAKE A SHOWER THE NIGHT BEFORE SURGERY AND THE DAY OF SURGERY    Please keep in mind the following:  DO NOT shave, including legs and underarms, 48 hours prior to surgery.   You may shave your face before/day of surgery.  Place clean sheets on your bed the night before surgery Use a clean washcloth (not used since being washed) for each shower. DO NOT sleep with pet's night before surgery.  CHG Shower Instructions:  Wash your face and private area with normal soap. If you choose to wash your hair, wash first with your normal  shampoo.  After you use shampoo/soap, rinse your hair and body thoroughly to remove shampoo/soap residue.  Turn the water OFF and apply half the bottle of CHG soap to a CLEAN washcloth.  Apply CHG soap ONLY FROM YOUR NECK DOWN TO YOUR TOES (washing for 3-5 minutes)  DO NOT use CHG soap on face, private areas, open wounds, or sores.  Pay special attention to the area where your surgery is being performed.  If you are having back surgery, having someone wash your back for you may be helpful. Wait 2 minutes after CHG soap is applied, then you may rinse off the CHG soap.  Pat dry with a clean towel  Put on clean pajamas    Additional instructions for the day of surgery: DO NOT APPLY any lotions, deodorants, cologne, or perfumes.   Do not wear jewelry or makeup Do not wear nail polish, gel polish, artificial nails, or any other type of covering on natural nails (fingers and toes) Do not bring valuables to the hospital. [facility] is not responsible for valuables/personal belongings. Put on clean/comfortable clothes.  Please brush your teeth.  Ask your nurse before applying any prescription medications to the skin.

## 2023-12-17 NOTE — H&P (Signed)
 Chief Complaint: Breast Cancer       History of Present Illness: Victoria Blair is a 78 y.o. female who is seen today as an office consultation at the request of Dr. Adelfa Homes for evaluation of Breast Cancer .   This is a 78 year old female with a history of right breast cancer status postlumpectomy and radiation therapy in 1998 who presents with a recent diagnosis of recurrent right breast cancer.  The patient is accompanied by her niece who states that the patient has begun to develop some memory loss issues.  The patient is status post thyroidectomy as well as cholecystectomy.   Recent screening mammogram revealed a possible mass in the area of her previous lumpectomy.  She underwent further workup that revealed a 1.9 x 1.3 x 1.8 cm hypoechoic irregular mass in the right breast at 10:00 located 4 cm from the nipple.  She underwent biopsy of this area that revealed invasive ductal carcinoma grade 2, ER/PR positive, H ER positive, Ki-67 20%.   Review of Systems: A complete review of systems was obtained from the patient.  I have reviewed this information and discussed as appropriate with the patient.  See HPI as well for other ROS.   Review of Systems  Constitutional:  Positive for weight loss.  HENT: Negative.    Eyes: Negative.   Respiratory: Negative.    Cardiovascular: Negative.   Gastrointestinal: Negative.   Genitourinary: Negative.   Musculoskeletal: Negative.   Skin: Negative.   Neurological: Negative.   Endo/Heme/Allergies: Negative.   Psychiatric/Behavioral:  Positive for memory loss.         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)   Essential hypertension   H/O Graves' disease   Hypothyroidism   Osteoporosis, post-menopausal   Pure hypercholesterolemia   Tobacco abuse        Past Surgical History           Past Surgical History:  Procedure Laterality Date   CESAREAN SECTION       CHOLECYSTECTOMY       THYROIDECTOMY TOTAL            Allergies  No Known Allergies      Medications Ordered Prior to Encounter             Current Outpatient Medications on File Prior to Visit  Medication Sig Dispense Refill   aspirin  81 MG EC tablet Take 81 mg by mouth once daily       EPINEPHrine  (EPIPEN ) 0.3 mg/0.3 mL auto-injector Inject 0.3 mg into the muscle       ibuprofen (MOTRIN) 800 MG tablet Take 800 mg by mouth every 6 (six) hours as needed       levothyroxine  (SYNTHROID ) 88 MCG tablet Take 88 mcg by mouth every morning before breakfast (0630)       nitroGLYcerin  (NITROSTAT ) 0.4 MG SL tablet Place 0.4 mg under the tongue       phenazopyridine  (PYRIDIUM ) 100 MG tablet Take 100 mg by mouth  pravastatin  (PRAVACHOL ) 20 MG tablet Take 1 tablet by mouth every evening       triamcinolone  0.1 % cream Apply 1 Application topically 2 (two) times daily       valACYclovir  (VALTREX ) 500 MG tablet Take 500 mg by mouth once daily       ascorbic acid, vitamin C, 500 mg Cap 1 tablet Orally Once a day       ezetimibe  (ZETIA ) 10 mg tablet Take 10 mg by mouth once daily       garlic 500 mg Cap Take 1 capsule by mouth once daily       L. acidophilus/Bifid. animalis 32 billion cell Cap Take 1 capsule by mouth once daily       omega-3-dha-epa-fish oil 300-1,000 mg capsule Take 1 g by mouth once daily       yeast,dried, S. cerevisiae, (BREWER'S YEAST ORAL) two tablets Orally once a day        No current facility-administered medications on file prior to visit.        Family History           Family History  Problem Relation Age of Onset   Stroke Mother     High blood  pressure (Hypertension) Father     Hyperlipidemia (Elevated cholesterol) Father     Coronary Artery Disease (Blocked arteries around heart) Father     Alzheimer's disease Sister     Diabetes Brother          Tobacco Use History  Social History           Tobacco Use  Smoking Status Every Day   Types: Cigarettes  Smokeless Tobacco Never        Social History  Social History             Socioeconomic History   Marital status: Unknown  Tobacco Use   Smoking status: Every Day      Types: Cigarettes   Smokeless tobacco: Never  Vaping Use   Vaping status: Never Used  Substance and Sexual Activity   Alcohol use: Never   Drug use: Never    Social Drivers of Health           Housing Stability: Unknown (09/04/2023)    Housing Stability Vital Sign     Homeless in the Last Year: No        Objective:             Vitals:    09/04/23 1634 09/04/23 1635  BP: (!) 142/80    Pulse: 80    Temp: 37 C (98.6 F)    SpO2: 98%    Weight: 58.4 kg (128 lb 11.2 oz)    Height: 170.2 cm (5\' 7" )    PainSc:   0-No pain    Body mass index is 20.16 kg/m.   Physical Exam    Constitutional:  WDWN in NAD, conversant, no obvious deformities; lying in bed comfortably Eyes:  Pupils equal, round; sclera anicteric; moist conjunctiva; no lid lag HENT:  Oral mucosa moist; good dentition  Neck:  No masses palpated, trachea midline; no thyromegaly Lungs:  CTA bilaterally; normal respiratory effort Breasts:  symmetric, no nipple changes; no palpable masses or lymphadenopathy on left side.  Right breast show a palpable mess (2.5 cm in diameter) in the RUOQ at 10:00.  There is an overlying 1 cm area of skin thickening that the patient states was not present prior to biopsy. CV:  Regular rate and rhythm;  no murmurs; extremities well-perfused with no edema Abd:  +bowel sounds, soft, non-tender, no palpable organomegaly; no palpable hernias Musc:  normal gait; no apparent clubbing or cyanosis in  extremities Lymphatic:  No palpable cervical or axillary lymphadenopathy Skin:  Warm, dry; no sign of jaundice Psychiatric - alert and oriented x 4; calm mood and affect     Labs, Imaging and Diagnostic Testing:   Diagnosis Breast, right, needle core biopsy, 10:00 4 cmfn INVASIVE DUCTAL CARCINOMA DUCTAL CARCINOMA IN SITU, SOLID TYPE, INTERMEDIATE NUCLEAR GRADE TUBULE FORMATION: SCORE 3 NUCLEAR PLEOMORPHISM: SCORE 2 MITOTIC COUNT: SCORE 1 TOTAL SCORE: 6 OVERALL GRADE: 2 LYMPHOVASCULAR INVASION: NOT IDENTIFIED CANCER LENGTH: 7 MM CALCIFICATIONS: NOT IDENTIFIED OTHER FINDINGS: NONE SEE NOTE Diagnosis Note Dr. Kavin Parsley reviewed the case and concurs with the interpretation. A breast prognostic profile (ER and PR) is pending and will be reported in an addendum. The breast center of [facility] was notified on 08/14/2023. Dillard Frame MD Pathologist, Electronic Signature (Case signed 08/14/2023)   PROGNOSTIC INDICATORS Results: IMMUNOHISTOCHEMICAL AND MORPHOMETRIC ANALYSIS PERFORMED MANUALLY The tumor cells are positive for Her2 (3+). Estrogen Receptor: 95%, POSITIVE, STRONG STAINING INTENSITY Progesterone Receptor: 95%, POSITIVE, STRONG STAINING INTENSITY Proliferation Marker Ki67: 20% REFERENCE RANGE ESTROGEN RECEPTOR NEGATIVE 0% POSITIVE =>1% REFERENCE RANGE PROGESTERONE RECEPTOR NEGATIVE 0% POSITIVE =>1% All controls stained appropriately Zhaoli Lane MD Pathologist, Electronic Signature ( Signed 08/15/2023)   CLINICAL DATA:  Patient recalled from screening for right breast mass and calcifications.   EXAM: DIGITAL DIAGNOSTIC UNILATERAL RIGHT MAMMOGRAM WITH TOMOSYNTHESIS AND CAD; ULTRASOUND RIGHT BREAST LIMITED   TECHNIQUE: Right digital diagnostic mammography and breast tomosynthesis was performed. The images were evaluated with computer-aided detection. ; Targeted ultrasound examination of the right breast was performed   COMPARISON:  Previous exam(s).    ACR Breast Density Category c: The breasts are heterogeneously dense, which may obscure small masses.   FINDINGS: Within the upper-outer right breast near the lumpectomy site there is a spiculated mass with associated punctate calcifications.   Targeted ultrasound is performed, showing a 1.9 x 1.3 x 1.8 cm irregular hypoechoic mass right breast 10 o'clock position 4 cm from nipple at the lumpectomy site. No right axillary adenopathy.   IMPRESSION: Suspicious mass right breast 10 o'clock position 4 cm from nipple at the lumpectomy site. There are associated suspicious calcifications which appear to be localized within the mass on mammography.   RECOMMENDATION: Ultrasound-guided core needle biopsy right breast mass 10 o'clock position 4 cm from nipple.   I have discussed the findings and recommendations with the patient. If applicable, a reminder letter will be sent to the patient regarding the next appointment.   BI-RADS CATEGORY  4: Suspicious.     Electronically Signed   By: Jone Neither M.D.   On: 08/08/2023 14:40   Assessment and Plan:  Diagnoses and all orders for this visit:   Invasive ductal carcinoma of breast, female, right (CMS/HHS-HCC) -     Ambulatory Referral to Oncology-Medical   I had a long discussion with the patient and her family members regarding her recurrent right breast cancer.  We discussed potential treatment options.  She is not a candidate for repeat breast conserving therapy since she has had radiation to that right breast.  Her surgical approach should be a right mastectomy with sentinel lymph node biopsy.  Surgery has been delayed while awaiting clearance from her medical providers, as well as patient scheduling issues.  Kari Otto. Eli Grizzle, MD, [facility] Surgery  General Surgery  12/17/2023 12:04 PM

## 2023-12-17 NOTE — H&P (View-Only) (Signed)
 Chief Complaint: Breast Cancer       History of Present Illness: Victoria Blair is a 78 y.o. female who is seen today as an office consultation at the request of Dr. Adelfa Homes for evaluation of Breast Cancer .   This is a 78 year old female with a history of right breast cancer status postlumpectomy and radiation therapy in 1998 who presents with a recent diagnosis of recurrent right breast cancer.  The patient is accompanied by her niece who states that the patient has begun to develop some memory loss issues.  The patient is status post thyroidectomy as well as cholecystectomy.   Recent screening mammogram revealed a possible mass in the area of her previous lumpectomy.  She underwent further workup that revealed a 1.9 x 1.3 x 1.8 cm hypoechoic irregular mass in the right breast at 10:00 located 4 cm from the nipple.  She underwent biopsy of this area that revealed invasive ductal carcinoma grade 2, ER/PR positive, H ER positive, Ki-67 20%.   Review of Systems: A complete review of systems was obtained from the patient.  I have reviewed this information and discussed as appropriate with the patient.  See HPI as well for other ROS.   Review of Systems  Constitutional:  Positive for weight loss.  HENT: Negative.    Eyes: Negative.   Respiratory: Negative.    Cardiovascular: Negative.   Gastrointestinal: Negative.   Genitourinary: Negative.   Musculoskeletal: Negative.   Skin: Negative.   Neurological: Negative.   Endo/Heme/Allergies: Negative.   Psychiatric/Behavioral:  Positive for memory loss.         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)   Essential hypertension   H/O Graves' disease   Hypothyroidism   Osteoporosis, post-menopausal   Pure hypercholesterolemia   Tobacco abuse        Past Surgical History           Past Surgical History:  Procedure Laterality Date   CESAREAN SECTION       CHOLECYSTECTOMY       THYROIDECTOMY TOTAL            Allergies  No Known Allergies      Medications Ordered Prior to Encounter             Current Outpatient Medications on File Prior to Visit  Medication Sig Dispense Refill   aspirin  81 MG EC tablet Take 81 mg by mouth once daily       EPINEPHrine  (EPIPEN ) 0.3 mg/0.3 mL auto-injector Inject 0.3 mg into the muscle       ibuprofen (MOTRIN) 800 MG tablet Take 800 mg by mouth every 6 (six) hours as needed       levothyroxine  (SYNTHROID ) 88 MCG tablet Take 88 mcg by mouth every morning before breakfast (0630)       nitroGLYcerin  (NITROSTAT ) 0.4 MG SL tablet Place 0.4 mg under the tongue       phenazopyridine  (PYRIDIUM ) 100 MG tablet Take 100 mg by mouth  pravastatin  (PRAVACHOL ) 20 MG tablet Take 1 tablet by mouth every evening       triamcinolone  0.1 % cream Apply 1 Application topically 2 (two) times daily       valACYclovir  (VALTREX ) 500 MG tablet Take 500 mg by mouth once daily       ascorbic acid, vitamin C, 500 mg Cap 1 tablet Orally Once a day       ezetimibe  (ZETIA ) 10 mg tablet Take 10 mg by mouth once daily       garlic 500 mg Cap Take 1 capsule by mouth once daily       L. acidophilus/Bifid. animalis 32 billion cell Cap Take 1 capsule by mouth once daily       omega-3-dha-epa-fish oil 300-1,000 mg capsule Take 1 g by mouth once daily       yeast,dried, S. cerevisiae, (BREWER'S YEAST ORAL) two tablets Orally once a day        No current facility-administered medications on file prior to visit.        Family History           Family History  Problem Relation Age of Onset   Stroke Mother     High blood  pressure (Hypertension) Father     Hyperlipidemia (Elevated cholesterol) Father     Coronary Artery Disease (Blocked arteries around heart) Father     Alzheimer's disease Sister     Diabetes Brother          Tobacco Use History  Social History           Tobacco Use  Smoking Status Every Day   Types: Cigarettes  Smokeless Tobacco Never        Social History  Social History             Socioeconomic History   Marital status: Unknown  Tobacco Use   Smoking status: Every Day      Types: Cigarettes   Smokeless tobacco: Never  Vaping Use   Vaping status: Never Used  Substance and Sexual Activity   Alcohol use: Never   Drug use: Never    Social Drivers of Health           Housing Stability: Unknown (09/04/2023)    Housing Stability Vital Sign     Homeless in the Last Year: No        Objective:             Vitals:    09/04/23 1634 09/04/23 1635  BP: (!) 142/80    Pulse: 80    Temp: 37 C (98.6 F)    SpO2: 98%    Weight: 58.4 kg (128 lb 11.2 oz)    Height: 170.2 cm (5\' 7" )    PainSc:   0-No pain    Body mass index is 20.16 kg/m.   Physical Exam    Constitutional:  WDWN in NAD, conversant, no obvious deformities; lying in bed comfortably Eyes:  Pupils equal, round; sclera anicteric; moist conjunctiva; no lid lag HENT:  Oral mucosa moist; good dentition  Neck:  No masses palpated, trachea midline; no thyromegaly Lungs:  CTA bilaterally; normal respiratory effort Breasts:  symmetric, no nipple changes; no palpable masses or lymphadenopathy on left side.  Right breast show a palpable mess (2.5 cm in diameter) in the RUOQ at 10:00.  There is an overlying 1 cm area of skin thickening that the patient states was not present prior to biopsy. CV:  Regular rate and rhythm;  no murmurs; extremities well-perfused with no edema Abd:  +bowel sounds, soft, non-tender, no palpable organomegaly; no palpable hernias Musc:  normal gait; no apparent clubbing or cyanosis in  extremities Lymphatic:  No palpable cervical or axillary lymphadenopathy Skin:  Warm, dry; no sign of jaundice Psychiatric - alert and oriented x 4; calm mood and affect     Labs, Imaging and Diagnostic Testing:   Diagnosis Breast, right, needle core biopsy, 10:00 4 cmfn INVASIVE DUCTAL CARCINOMA DUCTAL CARCINOMA IN SITU, SOLID TYPE, INTERMEDIATE NUCLEAR GRADE TUBULE FORMATION: SCORE 3 NUCLEAR PLEOMORPHISM: SCORE 2 MITOTIC COUNT: SCORE 1 TOTAL SCORE: 6 OVERALL GRADE: 2 LYMPHOVASCULAR INVASION: NOT IDENTIFIED CANCER LENGTH: 7 MM CALCIFICATIONS: NOT IDENTIFIED OTHER FINDINGS: NONE SEE NOTE Diagnosis Note Dr. Kavin Parsley reviewed the case and concurs with the interpretation. A breast prognostic profile (ER and PR) is pending and will be reported in an addendum. The [facility] center of [facility] was notified on 08/14/2023. Dillard Frame MD Pathologist, Electronic Signature (Case signed 08/14/2023)   PROGNOSTIC INDICATORS Results: IMMUNOHISTOCHEMICAL AND MORPHOMETRIC ANALYSIS PERFORMED MANUALLY The tumor cells are positive for Her2 (3+). Estrogen Receptor: 95%, POSITIVE, STRONG STAINING INTENSITY Progesterone Receptor: 95%, POSITIVE, STRONG STAINING INTENSITY Proliferation Marker Ki67: 20% REFERENCE RANGE ESTROGEN RECEPTOR NEGATIVE 0% POSITIVE =>1% REFERENCE RANGE PROGESTERONE RECEPTOR NEGATIVE 0% POSITIVE =>1% All controls stained appropriately Zhaoli Lane MD Pathologist, Electronic Signature ( Signed 08/15/2023)   CLINICAL DATA:  Patient recalled from screening for right breast mass and calcifications.   EXAM: DIGITAL DIAGNOSTIC UNILATERAL RIGHT MAMMOGRAM WITH TOMOSYNTHESIS AND CAD; ULTRASOUND RIGHT BREAST LIMITED   TECHNIQUE: Right digital diagnostic mammography and breast tomosynthesis was performed. The images were evaluated with computer-aided detection. ; Targeted ultrasound examination of the right breast was performed   COMPARISON:  Previous exam(s).    ACR Breast Density Category c: The breasts are heterogeneously dense, which may obscure small masses.   FINDINGS: Within the upper-outer right breast near the lumpectomy site there is a spiculated mass with associated punctate calcifications.   Targeted ultrasound is performed, showing a 1.9 x 1.3 x 1.8 cm irregular hypoechoic mass right breast 10 o'clock position 4 cm from nipple at the lumpectomy site. No right axillary adenopathy.   IMPRESSION: Suspicious mass right breast 10 o'clock position 4 cm from nipple at the lumpectomy site. There are associated suspicious calcifications which appear to be localized within the mass on mammography.   RECOMMENDATION: Ultrasound-guided core needle biopsy right breast mass 10 o'clock position 4 cm from nipple.   I have discussed the findings and recommendations with the patient. If applicable, a reminder letter will be sent to the patient regarding the next appointment.   BI-RADS CATEGORY  4: Suspicious.     Electronically Signed   By: Jone Neither M.D.   On: 08/08/2023 14:40   Assessment and Plan:  Diagnoses and all orders for this visit:   Invasive ductal carcinoma of breast, female, right (CMS/HHS-HCC) -     Ambulatory Referral to Oncology-Medical   I had a long discussion with the patient and her family members regarding her recurrent right breast cancer.  We discussed potential treatment options.  She is not a candidate for repeat breast conserving therapy since she has had radiation to that right breast.  Her surgical approach should be a right mastectomy with sentinel lymph node biopsy.  Surgery has been delayed while awaiting clearance from her medical providers, as well as patient scheduling issues.  Kari Otto. Eli Grizzle, MD, [facility] Surgery  General Surgery  12/17/2023 12:04 PM

## 2023-12-17 NOTE — Progress Notes (Signed)
 PCP - Ruven Coy, MD  Cardiologist - Dr.  Jann Melody   PPM/ICD - denies   Chest x-ray - 11/11/21 EKG - 11-12-23 Stress Test - 01-03-22 ECHO - 01-23-23 Cardiac Cath - denies  Sleep Study - denies   DM -denies  ASA/Blood Thinner Instructions: n/a   ERAS Protcol - Clear liquids till 0430 PRE-SURGERY Ensure given at PAT appt  COVID TEST- n/a   Anesthesia review: yes  Patient denies shortness of breath, fever, cough and chest pain at PAT appointment   All instructions explained to the patient, with a verbal understanding of the material. Patient agrees to go over the instructions while at home for a better understanding. Patient also instructed to self quarantine after being tested for COVID-19. The opportunity to ask questions was provided.

## 2023-12-18 ENCOUNTER — Ambulatory Visit (HOSPITAL_COMMUNITY)
Admission: RE | Admit: 2023-12-18 | Discharge: 2023-12-18 | Disposition: A | Discharge status: Home / Self Care | Source: Ambulatory Visit | Attending: Surgery | Admitting: Surgery

## 2023-12-18 ENCOUNTER — Ambulatory Visit (HOSPITAL_COMMUNITY)
Admission: RE | Admit: 2023-12-18 | Discharge: 2023-12-19 | Disposition: A | Discharge status: Home / Self Care | Attending: Surgery | Admitting: Surgery

## 2023-12-18 ENCOUNTER — Encounter (HOSPITAL_COMMUNITY): Payer: Self-pay | Admitting: Surgery

## 2023-12-18 ENCOUNTER — Ambulatory Visit (HOSPITAL_COMMUNITY): Payer: Self-pay | Admitting: Vascular Surgery

## 2023-12-18 ENCOUNTER — Encounter (HOSPITAL_COMMUNITY)
Admission: RE | Disposition: A | Discharge status: Home / Self Care | Payer: Self-pay | Source: Home / Self Care | Attending: Surgery

## 2023-12-18 ENCOUNTER — Other Ambulatory Visit: Payer: Self-pay

## 2023-12-18 ENCOUNTER — Ambulatory Visit (HOSPITAL_COMMUNITY): Admitting: Anesthesiology

## 2023-12-18 DIAGNOSIS — I2510 Atherosclerotic heart disease of native coronary artery without angina pectoris: Secondary | ICD-10-CM | POA: Diagnosis not present

## 2023-12-18 DIAGNOSIS — E039 Hypothyroidism, unspecified: Secondary | ICD-10-CM

## 2023-12-18 DIAGNOSIS — Z170 Estrogen receptor positive status [ER+]: Secondary | ICD-10-CM | POA: Diagnosis not present

## 2023-12-18 DIAGNOSIS — F17200 Nicotine dependence, unspecified, uncomplicated: Secondary | ICD-10-CM | POA: Diagnosis not present

## 2023-12-18 DIAGNOSIS — I25119 Atherosclerotic heart disease of native coronary artery with unspecified angina pectoris: Secondary | ICD-10-CM | POA: Insufficient documentation

## 2023-12-18 DIAGNOSIS — F17210 Nicotine dependence, cigarettes, uncomplicated: Secondary | ICD-10-CM | POA: Diagnosis not present

## 2023-12-18 DIAGNOSIS — C50911 Malignant neoplasm of unspecified site of right female breast: Secondary | ICD-10-CM | POA: Insufficient documentation

## 2023-12-18 DIAGNOSIS — G8918 Other acute postprocedural pain: Secondary | ICD-10-CM | POA: Diagnosis not present

## 2023-12-18 DIAGNOSIS — Z1721 Progesterone receptor positive status: Secondary | ICD-10-CM | POA: Insufficient documentation

## 2023-12-18 DIAGNOSIS — I10 Essential (primary) hypertension: Secondary | ICD-10-CM

## 2023-12-18 DIAGNOSIS — M1990 Unspecified osteoarthritis, unspecified site: Secondary | ICD-10-CM | POA: Diagnosis not present

## 2023-12-18 DIAGNOSIS — K219 Gastro-esophageal reflux disease without esophagitis: Secondary | ICD-10-CM | POA: Diagnosis not present

## 2023-12-18 DIAGNOSIS — Z923 Personal history of irradiation: Secondary | ICD-10-CM | POA: Insufficient documentation

## 2023-12-18 DIAGNOSIS — Z1731 Human epidermal growth factor receptor 2 positive status: Secondary | ICD-10-CM | POA: Diagnosis not present

## 2023-12-18 DIAGNOSIS — C50811 Malignant neoplasm of overlapping sites of right female breast: Secondary | ICD-10-CM | POA: Diagnosis not present

## 2023-12-18 DIAGNOSIS — D0511 Intraductal carcinoma in situ of right breast: Secondary | ICD-10-CM | POA: Diagnosis not present

## 2023-12-18 SURGERY — MASTECTOMY WITH SENTINEL LYMPH NODE BIOPSY
Anesthesia: Regional | Site: Breast | Laterality: Right

## 2023-12-18 MED ORDER — ACETAMINOPHEN 500 MG PO TABS
ORAL_TABLET | ORAL | Status: AC
  Administered 2023-12-18: 1000 mg via ORAL
  Filled 2023-12-18: qty 2

## 2023-12-18 MED ORDER — ONDANSETRON HCL 4 MG/2ML IJ SOLN: 4.0000 mg | Freq: Four times a day (QID) | INTRAMUSCULAR | Status: DC | PRN

## 2023-12-18 MED ORDER — LEVOTHYROXINE SODIUM 100 MCG PO TABS
100.0000 ug | ORAL_TABLET | Freq: Every day | ORAL | Status: DC
  Administered 2023-12-19: 100 ug via ORAL
  Filled 2023-12-18: qty 1

## 2023-12-18 MED ORDER — LIDOCAINE 2% (20 MG/ML) 5 ML SYRINGE
INTRAMUSCULAR | Status: AC
  Filled 2023-12-18: qty 5

## 2023-12-18 MED ORDER — LACTATED RINGERS IV SOLN: INTRAVENOUS | Status: DC | PRN

## 2023-12-18 MED ORDER — ACETAMINOPHEN 500 MG PO TABS: 1000.0000 mg | ORAL_TABLET | Freq: Once | ORAL | Status: AC

## 2023-12-18 MED ORDER — ACETAMINOPHEN 500 MG PO TABS
1000.0000 mg | ORAL_TABLET | ORAL | Status: DC
Start: 2023-12-18 — End: 2023-12-18

## 2023-12-18 MED ORDER — DEXAMETHASONE SODIUM PHOSPHATE 10 MG/ML IJ SOLN
INTRAMUSCULAR | Status: DC | PRN
  Administered 2023-12-18: 10 mg via INTRAVENOUS

## 2023-12-18 MED ORDER — PROPOFOL 10 MG/ML IV BOLUS
INTRAVENOUS | Status: DC | PRN
  Administered 2023-12-18: 100 mg via INTRAVENOUS

## 2023-12-18 MED ORDER — METHYLENE BLUE (ANTIDOTE) 1 % IV SOLN
INTRAVENOUS | Status: AC
Start: 2023-12-18 — End: ?
  Filled 2023-12-18: qty 10

## 2023-12-18 MED ORDER — BUPIVACAINE-EPINEPHRINE (PF) 0.25% -1:200000 IJ SOLN
INTRAMUSCULAR | Status: AC
Start: 2023-12-18 — End: ?
  Filled 2023-12-18: qty 30

## 2023-12-18 MED ORDER — CHLORHEXIDINE GLUCONATE CLOTH 2 % EX PADS: 6.0000 | MEDICATED_PAD | Freq: Once | CUTANEOUS | Status: DC

## 2023-12-18 MED ORDER — ONDANSETRON 4 MG PO TBDP: 4.0000 mg | ORAL_TABLET | Freq: Four times a day (QID) | ORAL | Status: DC | PRN

## 2023-12-18 MED ORDER — TECHNETIUM TC 99M TILMANOCEPT KIT
1.0000 | PACK | Freq: Once | INTRAVENOUS | Status: AC | PRN
  Administered 2023-12-18: 1 via INTRADERMAL

## 2023-12-18 MED ORDER — OXYCODONE HCL 5 MG PO TABS: 5.0000 mg | ORAL_TABLET | Freq: Once | ORAL | Status: DC | PRN

## 2023-12-18 MED ORDER — VALACYCLOVIR HCL 500 MG PO TABS
500.0000 mg | ORAL_TABLET | Freq: Every day | ORAL | Status: DC
  Administered 2023-12-18 – 2023-12-19 (×2): 500 mg via ORAL
  Filled 2023-12-18 (×2): qty 1

## 2023-12-18 MED ORDER — FENTANYL CITRATE (PF) 250 MCG/5ML IJ SOLN
INTRAMUSCULAR | Status: AC
Start: 2023-12-18 — End: ?
  Filled 2023-12-18: qty 5

## 2023-12-18 MED ORDER — CELECOXIB 200 MG PO CAPS
200.0000 mg | ORAL_CAPSULE | Freq: Once | ORAL | Status: AC
  Administered 2023-12-18: 200 mg via ORAL
  Filled 2023-12-18: qty 1

## 2023-12-18 MED ORDER — MIDAZOLAM HCL 2 MG/2ML IJ SOLN
INTRAMUSCULAR | Status: DC | PRN
  Administered 2023-12-18: 1 mg via INTRAVENOUS

## 2023-12-18 MED ORDER — IRBESARTAN 150 MG PO TABS
150.0000 mg | ORAL_TABLET | Freq: Every day | ORAL | Status: DC
  Administered 2023-12-18 – 2023-12-19 (×2): 150 mg via ORAL
  Filled 2023-12-18 (×2): qty 1

## 2023-12-18 MED ORDER — EPHEDRINE SULFATE-NACL 50-0.9 MG/10ML-% IV SOSY
PREFILLED_SYRINGE | INTRAVENOUS | Status: DC | PRN
Start: 2023-12-18 — End: 2023-12-18
  Administered 2023-12-18: 10 mg via INTRAVENOUS
  Administered 2023-12-18: 5 mg via INTRAVENOUS

## 2023-12-18 MED ORDER — SODIUM CHLORIDE 0.9 % IV SOLN: INTRAVENOUS | Status: DC

## 2023-12-18 MED ORDER — ORAL CARE MOUTH RINSE: 15.0000 mL | Freq: Once | OROMUCOSAL | Status: AC

## 2023-12-18 MED ORDER — TRANEXAMIC ACID 1000 MG/10ML IV SOLN
2000.0000 mg | Freq: Once | INTRAVENOUS | Status: DC
  Filled 2023-12-18: qty 20

## 2023-12-18 MED ORDER — MORPHINE SULFATE (PF) 4 MG/ML IV SOLN: 4.0000 mg | INTRAVENOUS | Status: DC | PRN

## 2023-12-18 MED ORDER — EZETIMIBE 10 MG PO TABS
10.0000 mg | ORAL_TABLET | Freq: Every day | ORAL | Status: DC
  Administered 2023-12-18 – 2023-12-19 (×2): 10 mg via ORAL
  Filled 2023-12-18 (×2): qty 1

## 2023-12-18 MED ORDER — PROPOFOL 10 MG/ML IV BOLUS
INTRAVENOUS | Status: AC
  Filled 2023-12-18: qty 20

## 2023-12-18 MED ORDER — EPHEDRINE 5 MG/ML INJ
INTRAVENOUS | Status: AC
  Filled 2023-12-18: qty 5

## 2023-12-18 MED ORDER — ANASTROZOLE 1 MG PO TABS
1.0000 mg | ORAL_TABLET | Freq: Every day | ORAL | Status: DC
  Administered 2023-12-18 – 2023-12-19 (×2): 1 mg via ORAL
  Filled 2023-12-18 (×2): qty 1

## 2023-12-18 MED ORDER — ONDANSETRON HCL 4 MG/2ML IJ SOLN
INTRAMUSCULAR | Status: DC | PRN
  Administered 2023-12-18: 4 mg via INTRAVENOUS

## 2023-12-18 MED ORDER — CHLORHEXIDINE GLUCONATE 0.12 % MT SOLN
OROMUCOSAL | Status: AC
Start: 2023-12-18 — End: 2023-12-18
  Administered 2023-12-18: 15 mL via OROMUCOSAL
  Filled 2023-12-18: qty 15

## 2023-12-18 MED ORDER — ACETAMINOPHEN 325 MG PO TABS
650.0000 mg | ORAL_TABLET | Freq: Four times a day (QID) | ORAL | Status: DC | PRN
  Administered 2023-12-18: 650 mg via ORAL
  Filled 2023-12-18 (×2): qty 2

## 2023-12-18 MED ORDER — 0.9 % SODIUM CHLORIDE (POUR BTL) OPTIME
TOPICAL | Status: DC | PRN
  Administered 2023-12-18: 1000 mL

## 2023-12-18 MED ORDER — DIPHENHYDRAMINE HCL 12.5 MG/5ML PO ELIX: 12.5000 mg | ORAL_SOLUTION | Freq: Four times a day (QID) | ORAL | Status: DC | PRN

## 2023-12-18 MED ORDER — FENTANYL CITRATE (PF) 100 MCG/2ML IJ SOLN: 25.0000 ug | INTRAMUSCULAR | Status: DC | PRN

## 2023-12-18 MED ORDER — SODIUM CHLORIDE (PF) 0.9 % IJ SOLN
INTRAMUSCULAR | Status: AC
  Filled 2023-12-18: qty 10

## 2023-12-18 MED ORDER — FENTANYL CITRATE (PF) 250 MCG/5ML IJ SOLN
INTRAMUSCULAR | Status: DC | PRN
Start: 2023-12-18 — End: 2023-12-18
  Administered 2023-12-18: 25 ug via INTRAVENOUS
  Administered 2023-12-18: 75 ug via INTRAVENOUS

## 2023-12-18 MED ORDER — OXYCODONE HCL 5 MG/5ML PO SOLN: 5.0000 mg | Freq: Once | ORAL | Status: DC | PRN

## 2023-12-18 MED ORDER — LIDOCAINE 2% (20 MG/ML) 5 ML SYRINGE
INTRAMUSCULAR | Status: DC | PRN
  Administered 2023-12-18: 50 mg via INTRAVENOUS

## 2023-12-18 MED ORDER — BUPIVACAINE HCL (PF) 0.5 % IJ SOLN
INTRAMUSCULAR | Status: DC | PRN
  Administered 2023-12-18: 30 mL via PERINEURAL

## 2023-12-18 MED ORDER — DIPHENHYDRAMINE HCL 50 MG/ML IJ SOLN: 12.5000 mg | Freq: Four times a day (QID) | INTRAMUSCULAR | Status: DC | PRN

## 2023-12-18 MED ORDER — CEFAZOLIN SODIUM-DEXTROSE 2-4 GM/100ML-% IV SOLN
INTRAVENOUS | Status: AC
  Filled 2023-12-18: qty 100

## 2023-12-18 MED ORDER — TRANEXAMIC ACID 1000 MG/10ML IV SOLN
INTRAVENOUS | Status: DC | PRN
  Administered 2023-12-18: 2000 mg via TOPICAL

## 2023-12-18 MED ORDER — MAGNESIUM OXIDE -MG SUPPLEMENT 400 (240 MG) MG PO TABS
400.0000 mg | ORAL_TABLET | Freq: Every day | ORAL | Status: DC
  Administered 2023-12-19: 400 mg via ORAL
  Filled 2023-12-18: qty 1

## 2023-12-18 MED ORDER — MEPERIDINE HCL 25 MG/ML IJ SOLN
6.2500 mg | INTRAMUSCULAR | Status: DC | PRN
Start: 2023-12-18 — End: 2023-12-18

## 2023-12-18 MED ORDER — CARVEDILOL 6.25 MG PO TABS
6.2500 mg | ORAL_TABLET | Freq: Two times a day (BID) | ORAL | Status: DC
  Administered 2023-12-18 – 2023-12-19 (×2): 6.25 mg via ORAL
  Filled 2023-12-18 (×2): qty 1

## 2023-12-18 MED ORDER — ONDANSETRON HCL 4 MG/2ML IJ SOLN: 4.0000 mg | Freq: Once | INTRAMUSCULAR | Status: DC | PRN

## 2023-12-18 MED ORDER — ACETAMINOPHEN 650 MG RE SUPP: 650.0000 mg | Freq: Four times a day (QID) | RECTAL | Status: DC | PRN

## 2023-12-18 MED ORDER — PRAVASTATIN SODIUM 10 MG PO TABS
20.0000 mg | ORAL_TABLET | Freq: Every day | ORAL | Status: DC
  Administered 2023-12-18: 20 mg via ORAL
  Filled 2023-12-18: qty 2

## 2023-12-18 MED ORDER — MIDAZOLAM HCL 2 MG/2ML IJ SOLN
INTRAMUSCULAR | Status: AC
  Filled 2023-12-18: qty 2

## 2023-12-18 MED ORDER — CHLORHEXIDINE GLUCONATE 0.12 % MT SOLN: 15.0000 mL | Freq: Once | OROMUCOSAL | Status: AC

## 2023-12-18 MED ORDER — TRAMADOL HCL 50 MG PO TABS: 50.0000 mg | ORAL_TABLET | Freq: Four times a day (QID) | ORAL | Status: DC | PRN

## 2023-12-18 MED ORDER — CEFAZOLIN SODIUM-DEXTROSE 2-4 GM/100ML-% IV SOLN
2.0000 g | INTRAVENOUS | Status: AC
  Administered 2023-12-18: 2 g via INTRAVENOUS

## 2023-12-18 SURGICAL SUPPLY — 37 items
BENZOIN TINCTURE PRP APPL 2/3 (GAUZE/BANDAGES/DRESSINGS) ×2 IMPLANT
BINDER BREAST LRG (GAUZE/BANDAGES/DRESSINGS) IMPLANT
BINDER BREAST XLRG (GAUZE/BANDAGES/DRESSINGS) IMPLANT
BIOPATCH RED 1 DISK 7.0 (GAUZE/BANDAGES/DRESSINGS) ×2 IMPLANT
CANISTER SUCT 3000ML PPV (MISCELLANEOUS) ×2 IMPLANT
CHLORAPREP W/TINT 26 (MISCELLANEOUS) ×2 IMPLANT
CLIP APPLIE 9.375 MED OPEN (MISCELLANEOUS) ×2 IMPLANT
CNTNR URN SCR LID CUP LEK RST (MISCELLANEOUS) ×2 IMPLANT
COVER PROBE W GEL 5X96 (DRAPES) ×2 IMPLANT
COVER SURGICAL LIGHT HANDLE (MISCELLANEOUS) ×2 IMPLANT
DRAIN CHANNEL 19F RND (DRAIN) ×2 IMPLANT
DRAPE CHEST BREAST 15X10 FENES (DRAPES) ×2 IMPLANT
DRSG TEGADERM 4X4.75 (GAUZE/BANDAGES/DRESSINGS) ×2 IMPLANT
ELECT CAUTERY BLADE 6.4 (BLADE) ×2 IMPLANT
ELECTRODE BLDE 4.0 EZ CLN MEGD (MISCELLANEOUS) ×2 IMPLANT
ELECTRODE REM PT RTRN 9FT ADLT (ELECTROSURGICAL) ×2 IMPLANT
EVACUATOR SILICONE 100CC (DRAIN) ×2 IMPLANT
GAUZE PAD ABD 8X10 STRL (GAUZE/BANDAGES/DRESSINGS) ×4 IMPLANT
GAUZE SPONGE 4X4 12PLY STRL (GAUZE/BANDAGES/DRESSINGS) ×2 IMPLANT
GLOVE BIO SURGEON STRL SZ7 (GLOVE) ×2 IMPLANT
GLOVE BIOGEL PI IND STRL 7.5 (GLOVE) ×2 IMPLANT
GOWN STRL REUS W/ TWL LRG LVL3 (GOWN DISPOSABLE) ×4 IMPLANT
HEMOSTAT ARISTA ABSORB 3G PWDR (HEMOSTASIS) IMPLANT
KIT BASIN OR (CUSTOM PROCEDURE TRAY) ×2 IMPLANT
KIT TURNOVER KIT B (KITS) ×2 IMPLANT
NS IRRIG 1000ML POUR BTL (IV SOLUTION) ×2 IMPLANT
PACK GENERAL/GYN (CUSTOM PROCEDURE TRAY) ×2 IMPLANT
PAD ARMBOARD POSITIONER FOAM (MISCELLANEOUS) ×2 IMPLANT
PENCIL SMOKE EVACUATOR (MISCELLANEOUS) ×2 IMPLANT
STRIP CLOSURE SKIN 1/2X4 (GAUZE/BANDAGES/DRESSINGS) ×2 IMPLANT
SUT ETHILON 2 0 FS 18 (SUTURE) ×2 IMPLANT
SUT MNCRL AB 4-0 PS2 18 (SUTURE) ×2 IMPLANT
SUT SILK 2 0 SH (SUTURE) ×2 IMPLANT
SUT VIC AB 3-0 SH 18 (SUTURE) ×2 IMPLANT
SYR CONTROL 10ML LL (SYRINGE) ×2 IMPLANT
TOWEL GREEN STERILE (TOWEL DISPOSABLE) ×2 IMPLANT
TOWEL GREEN STERILE FF (TOWEL DISPOSABLE) ×2 IMPLANT

## 2023-12-18 NOTE — Anesthesia Procedure Notes (Signed)
 Anesthesia Regional Block: Pectoralis block   Pre-Anesthetic Checklist: , timeout performed,  Correct Patient, Correct Site, Correct Laterality,  Correct Procedure, Correct Position, site marked,  Risks and benefits discussed,  Surgical consent,  Pre-op evaluation,  At surgeon's request and post-op pain management  Laterality: Right  Prep: chloraprep       Needles:  Injection technique: Single-shot  Needle Type: Echogenic Stimulator Needle     Needle Length: 5cm  Needle Gauge: 22     Additional Needles:   Procedures:, nerve stimulator,,, ultrasound used (permanent image in chart),,    Narrative:  Start time: 12/18/2023 7:00 AM End time: 12/18/2023 7:05 AM Injection made incrementally with aspirations every 5 mL.  Performed by: Personally  Anesthesiologist: Rhenda Cedars, MD  Additional Notes: Functioning IV was confirmed and monitors were applied.  A 50mm 22ga Arrow echogenic stimulator needle was used. Sterile prep and drape,hand hygiene and sterile gloves were used. Ultrasound guidance: relevant anatomy identified, needle position confirmed, local anesthetic spread visualized around nerve(s)., vascular puncture avoided.  Image printed for medical record. Negative aspiration and negative test dose prior to incremental administration of local anesthetic. The patient tolerated the procedure well.

## 2023-12-18 NOTE — Progress Notes (Signed)
 Pt admitted to 6N25 s/p Right Mastectomy with right Ax Sentinel lymph node. Family at bedside. Right arm placed on comfort pillow. Pink restricted arm band on pt. SCDs placed on pt and operating. Breast Cancer bag put in room.

## 2023-12-18 NOTE — Op Note (Signed)
 Pre-op diagnosis: Recurrent invasive ductal carcinoma right breast Postop diagnosis: Same Procedure performed: Right mastectomy with sentinel lymph node biopsy Surgeon:Heddy Vidana K Oryn Casanova Anesthesia: General Indications: This is a 78 year old female who is status post lumpectomy and radiation therapy 1998 for right breast cancer.  In January of this year, she was diagnosed with recurrent right breast cancer.  Biopsy revealed invasive ductal carcinoma grade 2, ER/PR positive, HER2 positive.  She was seen in consultation in January of this year.  We recommended mastectomy.  However, the patient has caused multiple delays in scheduling her surgery by not showing up for preoperative appointments.  Her surgery today is slightly delayed because she ate breakfast despite being instructed to remain NPO.  She now presents for mastectomy.  She was injected with radiotracer in the preoperative area.  Description of procedure: The patient is brought to the operating room and placed in supine position on the operating table.  After an adequate level general anesthesia was obtained, the patient's right chest and axilla were prepped with ChloraPrep and draped sterile fashion.  A timeout was taken to ensure the proper patient and proper procedure.  The patient has obvious skin changes over the area of the cancer that may represent gross tumor coming through the skin.  We made an elliptical incision to include a wide margin around this area of skin changes.  Cautery was used to dissect down to the chest wall inferiorly at the inframammary crease.  Superiorly we dissected to the chest wall below the clavicle.  Medially we dissected to the edge of the sternum.  Laterally we dissected to anterior edge of the latissimus muscle.  We then interrogated the axilla.  There is an area of increased radioactivity.  We excised 2 lymph nodes containing radiotracer.  These were marked as sentinel lymph nodes #1 and #2.  These were sent for  pathology.  We then dissected the breast off of the underlying pectoralis muscle from medial to lateral, taking the anterior pectoralis fascia with the specimen.  The specimen was oriented with a long suture lateral and a short suture superior.  We irrigated the wound thoroughly and inspected for hemostasis.  We inserted a sponge soaked with TXA and left for several minutes.  A 19 French drain was inserted through a stab incision inferiorly.  This was secured with 2-0 Ethilon.  The TXA sponge was removed and we again inspected for hemostasis.  The wound was closed with multiple interrupted subcutaneous 3-0 Vicryl suture.  4-0 Monocryl was used to close the skin in a running subcuticular fashion.  Benzoin and Steri-Strips were applied.  A clean dressing is applied.  The drain is placed to suction.  A breast binder is placed.  The patient is then extubated and brought to the recovery room in stable condition.  All sponge, instrument, and needle counts are correct.  Kari Otto. Eli Grizzle, MD, [facility]  General Surgery   12/18/2023 12:16 PM

## 2023-12-18 NOTE — Anesthesia Procedure Notes (Signed)
 Procedure Name: LMA Insertion Date/Time: 12/18/2023 11:13 AM  Performed by: Philemon Braver, CRNAPre-anesthesia Checklist: Patient identified, Emergency Drugs available, Suction available and Patient being monitored Patient Re-evaluated:Patient Re-evaluated prior to induction Oxygen Delivery Method: Circle System Utilized Preoxygenation: Pre-oxygenation with 100% oxygen Induction Type: IV induction Ventilation: Mask ventilation without difficulty LMA: LMA inserted LMA Size: 3.0 Number of attempts: 1 Placement Confirmation: positive ETCO2 Tube secured with: Tape Dental Injury: Teeth and Oropharynx as per pre-operative assessment

## 2023-12-18 NOTE — Transfer of Care (Signed)
 Immediate Anesthesia Transfer of Care Note  Patient: Victoria Blair  Procedure(s) Performed: RIGHT MASTECTOMY (Right: Breast) RIGHT AXILLARY SENTINEL LYMPH NODE (Right: Axilla)  Patient Location: PACU  Anesthesia Type:General and Regional  Level of Consciousness: patient cooperative and responds to stimulation  Airway & Oxygen Therapy: Patient Spontanous Breathing  Post-op Assessment: Report given to RN and Post -op Vital signs reviewed and stable  Post vital signs: Reviewed and stable  Last Vitals:  Vitals Value Taken Time  BP 153/61 12/18/23 1230  Temp    Pulse 60 12/18/23 1234  Resp 11 12/18/23 1234  SpO2 97 % 12/18/23 1234  Vitals shown include unfiled device data.  Last Pain:  Vitals:   12/18/23 0557  TempSrc:   PainSc: 0-No pain         Complications: No notable events documented.

## 2023-12-18 NOTE — Progress Notes (Signed)
 Patient came this morning for surgery accompanied by her sister. Alert and oriented x 4, patient denied any pain or others distress. When patient was asked by this writer when was the last time she had solid food and liquids, patient verbalized that the last time she eat at 20:00 o'clock last night and she drank a full glass of water this AM with levothyroxine  (SYNTHROID ). CRNA and the anesthesiologist asked the same questions before nerve block and patient verbalized the she didn't eat since last night. When OR nurse asked the patient what she ate for breakfast, the patient said "a boiled egg". Patient's sister was asked if the patient had anything to eat this AM and she verbalized that she didn't see the patient eating anything. When asked again, the patient said that she ate a boiled egg at 4 AM, before she left the house. Dr. Eli Grizzle made aware and per his verbal order, the surgery was postpone for 12:00 o'clock (8 hrs after patient ate).

## 2023-12-18 NOTE — Anesthesia Postprocedure Evaluation (Signed)
 Anesthesia Post Note  Patient: Victoria Blair  Procedure(s) Performed: RIGHT MASTECTOMY (Right: Breast) RIGHT AXILLARY SENTINEL LYMPH NODE (Right: Axilla)     Patient location during evaluation: PACU Anesthesia Type: Regional and General Level of consciousness: awake and alert Pain management: pain level controlled Vital Signs Assessment: post-procedure vital signs reviewed and stable Respiratory status: spontaneous breathing, nonlabored ventilation, respiratory function stable and patient connected to nasal cannula oxygen Cardiovascular status: blood pressure returned to baseline and stable Postop Assessment: no apparent nausea or vomiting Anesthetic complications: no   No notable events documented.  Last Vitals:  Vitals:   12/18/23 1245 12/18/23 1300  BP: (!) 157/49 (!) 143/61  Pulse: 62 65  Resp: 13 20  Temp:  36.4 C  SpO2: 99% 100%    Last Pain:  Vitals:   12/18/23 1300  TempSrc:   PainSc: 0-No pain                 Arhum Peeples

## 2023-12-18 NOTE — Interval H&P Note (Signed)
 History and Physical Interval Note:  12/18/2023 6:38 AM  Victoria Blair  has presented today for surgery, with the diagnosis of RECURRENT RIGHT BREAST INVASIVE DUCTAL CARCINOMA.  The various methods of treatment have been discussed with the patient and family. After consideration of risks, benefits and other options for treatment, the patient has consented to  Procedure(s) with comments: MASTECTOMY WITH SENTINEL LYMPH NODE BIOPSY (Right) - RIGHT MASTECTOMY WITH SENTINEL LYMPH NODE BIOPSY as a surgical intervention.  The patient's history has been reviewed, patient examined, no change in status, stable for surgery.  I have reviewed the patient's chart and labs.  Questions were answered to the patient's satisfaction.     Rella Cardinal

## 2023-12-19 ENCOUNTER — Encounter (HOSPITAL_COMMUNITY): Payer: Self-pay | Admitting: Surgery

## 2023-12-19 DIAGNOSIS — C50911 Malignant neoplasm of unspecified site of right female breast: Secondary | ICD-10-CM | POA: Diagnosis not present

## 2023-12-19 MED ORDER — TRAMADOL HCL 50 MG PO TABS: 50.0000 mg | ORAL_TABLET | Freq: Four times a day (QID) | ORAL | 0 refills | Status: DC | PRN

## 2023-12-19 NOTE — Progress Notes (Signed)
 Patient has been discharged per MD order. IV has been removed, tolerated well. JP drain will be going home with the patient and teaching has been provided, verbalized understanding. AVS discharge instructions have been reviewed with patient, verbalized understanding. Patient is getting dressed to leave with assistance, bed in lowest position, call light within reach.

## 2023-12-19 NOTE — Discharge Instructions (Signed)
 [facility] [phone]  MASTECTOMY: POST OP INSTRUCTIONS  Always review your discharge instruction sheet given to you by the facility where your surgery was performed. IF YOU HAVE DISABILITY OR FAMILY LEAVE FORMS, YOU MUST BRING THEM TO THE OFFICE FOR PROCESSING.   DO NOT GIVE THEM TO YOUR DOCTOR. A prescription for pain medication may be given to you upon discharge.  Take your pain medication as prescribed, if needed.  If narcotic pain medicine is not needed, then you may take acetaminophen (Tylenol) or ibuprofen (Advil) as needed. Take your usually prescribed medications unless otherwise directed. If you need a refill on your pain medication, please contact your pharmacy.  They will contact our office to request authorization.  Prescriptions will not be filled after 5pm or on week-ends. You should follow a light diet the first few days after arrival home, such as soup and crackers, etc.  Resume your normal diet the day after surgery. Most patients will experience some swelling and bruising on the chest and underarm.  Ice packs will help.  Swelling and bruising can take several days to resolve.  It is common to experience some constipation if taking pain medication after surgery.  Increasing fluid intake and taking a stool softener (such as Colace) will usually help or prevent this problem from occurring.  A mild laxative (Milk of Magnesia or Miralax) should be taken according to package instructions if there are no bowel movements after 48 hours. Unless discharge instructions indicate otherwise, leave your bandage dry and in place until your next appointment in 3-5 days.  You may take a limited sponge bath.  No tube baths or showers until the drains are removed.  You may have steri-strips (small skin tapes) in place directly over the incision.  These strips should be left on the skin for 7-10 days.  If your surgeon used skin glue on the incision, you may shower in 24 hours.   The glue will flake off over the next 2-3 weeks.  Any sutures or staples will be removed at the office during your follow-up visit. DRAINS:  If you have drains in place, it is important to keep a list of the amount of drainage produced each day in your drains.  Before leaving the hospital, you should be instructed on drain care.  Call our office if you have any questions about your drains. ACTIVITIES:  You may resume regular (light) daily activities beginning the next day--such as daily self-care, walking, climbing stairs--gradually increasing activities as tolerated.  You may have sexual intercourse when it is comfortable.  Refrain from any heavy lifting or straining until approved by your doctor. You may drive when you are no longer taking prescription pain medication, you can comfortably wear a seatbelt, and you can safely maneuver your car and apply brakes. RETURN TO WORK:  __________________________________________________________ Bonita Quin should see your doctor in the office for a follow-up appointment approximately 3-5 days after your surgery.  Your doctor's nurse will typically make your follow-up appointment when she calls you with your pathology report.  Expect your pathology report 2-3 business days after your surgery.  You may call to check if you do not hear from [place] after three days.   OTHER INSTRUCTIONS: ______________________________________________________________________________________________ ____________________________________________________________________________________________ WHEN TO CALL YOUR DOCTOR: Fever over 101.0 Nausea and/or vomiting Extreme swelling or bruising Continued bleeding from incision. Increased pain, redness, or drainage from the incision. The clinic staff is available to answer your questions during regular business hours.  Please don't hesitate  to call and ask to speak to one of the nurses for clinical concerns.  If you have a medical emergency, go to the  nearest emergency room or call 911.  A surgeon from [facility] is always on call at the hospital. [address], [place], [place]  [address] ? [address] [address], [place], [place]   [address] [phone] ? [phone] ? FAX [phone] Web site: [email or web address]

## 2023-12-19 NOTE — Discharge Summary (Signed)
 Physician Discharge Summary  Patient ID: Victoria Blair MRN: [id number] DOB/AGE: 1946-01-11 78 y.o.  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  She did well overnight with no sign of bleeding.  Pain well-controlled. Ready for discharge.   Treatments: surgery: right mastectomy with SLNB  Discharge Exam: Blood pressure 107/60, pulse 72, temperature 98.6 F (37 C), temperature source Oral, resp. rate 18, height 5\' 7"  (1.702 m), weight 54.4 kg, SpO2 98%. Right chest - no hematoma, skin flaps viable; no drainage on dressing JP thin bloody drainage - only 25 ml overnight  Disposition: Discharge disposition: 01-Home or Self Care       Discharge Instructions     Call MD for:  persistant nausea and vomiting   Complete by: As directed    Call MD for:  redness, tenderness, or signs of infection (pain, swelling, redness, odor or green/yellow discharge around incision site)   Complete by: As directed    Call MD for:  severe uncontrolled pain   Complete by: As directed    Call MD for:  temperature >100.4   Complete by: As directed    Diet general   Complete by: As directed    Discharge wound care:   Complete by: As directed    Sponge baths only. Empty and record the drain output twice a day.  Bring these numbers with you next week   Driving Restrictions   Complete by: As directed    Do not drive while taking pain medications   Increase activity slowly   Complete by: As directed       Allergies as of 12/19/2023       Reactions   Erythromycin Base Anaphylaxis, Other (See Comments)   Hydrocodone -acetaminophen  Nausea And Vomiting   Patient passed out with this medication    Carbamide Peroxide    Lipitor [atorvastatin]    Myalgias        Medication List     TAKE these medications    anastrozole  1 MG tablet Commonly known as: ARIMIDEX  Take 1 tablet (1 mg total) by mouth daily.   ascorbic acid 500 MG tablet Commonly known as: VITAMIN C Take 500 mg by mouth daily.   BLACK CURRANT SEED OIL PO Take 1 capsule by mouth daily.   carvedilol  6.25 MG tablet Commonly known as: COREG  Take 1 tablet (6.25 mg total) by mouth 2 (two) times daily.   cholecalciferol 25 MCG (1000 UNIT) tablet Commonly known as: VITAMIN D3 Take 1,000 Units by mouth daily.   EPINEPHrine  0.3 mg/0.3 mL Soaj injection Commonly known as: EPI-PEN Inject 0.3 mg into the muscle as needed for anaphylaxis.   ezetimibe  10 MG tablet Commonly known as: ZETIA  TAKE 1 TABLET BY MOUTH EVERY DAY   irbesartan  150 MG tablet Commonly known as: Avapro  Take 1 tablet (150 mg total) by mouth daily.   levothyroxine  100 MCG tablet Commonly known as: SYNTHROID  Take 100 mcg by mouth daily before breakfast.   magnesium  oxide 400 (240 Mg) MG tablet Commonly known as: MAG-OX Take 400 mg by mouth daily.   nitroGLYCERIN  0.4 MG SL tablet Commonly known as: NITROSTAT  Place 1 tablet (0.4 mg total) under the tongue every 5 (five) minutes as needed for chest pain (may take up to 3 tablets).  pravastatin  20 MG tablet Commonly known as: PRAVACHOL  TAKE 1 TABLET BY MOUTH EVERY DAY IN THE EVENING   Probiotic Daily Caps Take 1 capsule by mouth daily.   traMADol  50 MG tablet Commonly known as: ULTRAM  Take 1 tablet (50 mg total) by mouth every 6 (six) hours as needed (mild pain).   valACYclovir  500 MG tablet Commonly known as: VALTREX  Take 500 mg by mouth daily.               Discharge Care Instructions  (From admission, onward)           Start     Ordered   12/19/23 0000  Discharge wound care:       Comments: Sponge baths only. Empty and record the drain output twice a day.  Bring  these numbers with you next week   12/19/23 0847            Follow-up Information     Dareen Ebbing, MD Follow up in 1 week(s).   Specialty: General Surgery Contact information: [address] [place] [address] [place] [address] [phone]                 Signed: Rella Cardinal 12/19/2023, 8:48 AM

## 2023-12-19 NOTE — Plan of Care (Signed)

## 2023-12-20 LAB — SURGICAL PATHOLOGY

## 2023-12-23 ENCOUNTER — Ambulatory Visit: Admitting: Hematology and Oncology

## 2023-12-23 ENCOUNTER — Encounter: Payer: Self-pay | Admitting: *Deleted

## 2023-12-24 ENCOUNTER — Encounter: Payer: Self-pay | Admitting: Endocrinology

## 2023-12-24 ENCOUNTER — Ambulatory Visit: Payer: Medicare HMO | Admitting: Endocrinology

## 2023-12-24 VITALS — BP 132/80 | HR 68 | Resp 20 | Ht 67.0 in | Wt 128.6 lb

## 2023-12-24 DIAGNOSIS — E559 Vitamin D deficiency, unspecified: Secondary | ICD-10-CM | POA: Diagnosis not present

## 2023-12-24 DIAGNOSIS — Z8639 Personal history of other endocrine, nutritional and metabolic disease: Secondary | ICD-10-CM

## 2023-12-24 DIAGNOSIS — M810 Age-related osteoporosis without current pathological fracture: Secondary | ICD-10-CM | POA: Diagnosis not present

## 2023-12-24 DIAGNOSIS — E890 Postprocedural hypothyroidism: Secondary | ICD-10-CM

## 2023-12-24 LAB — VITAMIN D 25 HYDROXY (VIT D DEFICIENCY, FRACTURES): Vit D, 25-Hydroxy: 22 ng/mL — ABNORMAL LOW (ref 30–100)

## 2023-12-24 LAB — TSH: TSH: 0.1 m[IU]/L — ABNORMAL LOW (ref 0.40–4.50)

## 2023-12-24 LAB — T4, FREE: Free T4: 1.7 ng/dL (ref 0.8–1.8)

## 2023-12-24 NOTE — Progress Notes (Addendum)
 Outpatient Endocrinology Note Victoria Jefferson Fullam, MD  12/24/23  Patient's Name: Victoria Blair    DOB: [birth year]    MRN: [id number]  REASON OF VISIT: Follow-up for hypothyroidism /osteoporosis.  PCP: Ruven Coy, MD  HISTORY OF PRESENT ILLNESS:   Victoria Blair is a 78 y.o. old female with past medical history as listed below is presented for a follow up of hypothyroidism /osteoporosis / vitamin D  deficiency.   Pertinent History: # Hypothyroidism -Patient has postablative hypothyroidism was diagnosed in 1978 after radioactive iodine I-131 ablation for Graves' disease.  She has been on thyroid  hormone replacement/levothyroxine  and dose had been adjusted periodically.  Compliance and confusion of the dose used to be the case in the past.  # Osteoporosis Patient has osteoporosis T-score at the spine was -2.8 in October 2018.  She was previously given Actonel but he stopped because of difficulty with compliance.  She had taken Evista  but did not take it regularly, started in October 2019, was subsequently stopped due to cost. T-score at the spine was -3.1 and declined by 4.6% in November 2020. -She was started on Reclast  5 mg IV infusion annually.  First dose was in December 2020.  Last Prolia injection was in January 2024.  -DEXA scan in November 2022 with lowest T-score of -2.7 at the spine, improvement on bone density at the spine and some decline in the hips. -She has been taking calcium and vitamin D  supplement.  Vitamin D  level is 32.69 in January 2024.  Results:   Lumbar spine L1-L4 Femoral neck (FN) 33% distal radius  T-score -2.7 RFN: -1.9 LFN: -2.6 n/a  Change in BMD from previous DXA test (%) Up 5.9% Down 5.3% n/a  (*) statistically significant   Assessment: Patient has OSTEOPOROSIS according to the [facility] classification for osteoporosis (see below).   Interval history  Patient has been taking levothyroxine  100 mcg daily in the morning fasting and reports compliance.   Denies palpitation and heat intolerance.  She complains of cold intolerance.  Overall feeling usual and normal energy.  She had undergone right mastectomy on April 23, recovering from surgery, actively following with oncology.  She has been taking calcium, does not recall dosing amount, and vitamin D3 1000 international unit daily.  No fall and fracture.  She did not receive Reclast  this year in January/February timeframe, was engaged with breast cancer treatment.  Patient is accompanied by niece/family member in the clinic today.  In the last visit in January patient had thyroid  function test with primary care provider office reports normal however not available to review at that time, records not received.  REVIEW OF SYSTEMS:  As per history of present illness.   PAST MEDICAL HISTORY: Past Medical History:  Diagnosis Date   Aortic insufficiency 02/11/2023   TTE 01/23/23: EF 65-70, no RWMA, mod LVH, Gr 1 DD, NL RVSF, NL PASP, RVSP 29.4, mod AI, RAP 3   Breast cancer (HCC) 1997   Depression    GERD (gastroesophageal reflux disease)    Graves disease    Hemorrhoids    Hypertension    Hypothyroidism    Migraines    Osteoporosis    Personal history of radiation therapy 1998   Vitamin D  deficiency     PAST SURGICAL HISTORY: Past Surgical History:  Procedure Laterality Date   AXILLARY SENTINEL NODE BIOPSY Right 12/18/2023   Procedure: RIGHT AXILLARY SENTINEL LYMPH NODE;  Surgeon: Dareen Ebbing, MD;  Location: [facility] OR;  Service: General;  Laterality: Right;  BREAST BIOPSY Right 08/11/1996   BREAST BIOPSY Right 08/13/2023   US  RT BREAST BX W LOC DEV 1ST LESION IMG BX SPEC US  GUIDE 08/13/2023 [facility] MAMMOGRAPHY   BREAST LUMPECTOMY Right 1998   CESAREAN SECTION     CHOLECYSTECTOMY     MASTECTOMY W/ SENTINEL NODE BIOPSY Right 12/18/2023   Procedure: RIGHT MASTECTOMY;  Surgeon: Dareen Ebbing, MD;  Location: [facility] OR;  Service: General;  Laterality: Right;  RIGHT MASTECTOMY   SPINE SURGERY       ALLERGIES: Allergies  Allergen Reactions   Erythromycin Base Anaphylaxis and Other (See Comments)   Hydrocodone -Acetaminophen  Nausea And Vomiting    Patient passed out with this medication   Carbamide Peroxide    Lipitor [Atorvastatin]     Myalgias   Lisinopril  Other (See Comments)    "Passed out"    FAMILY HISTORY:  Family History  Problem Relation Age of Onset   Heart disease Father    Pulmonary fibrosis Father    Hypertension Father    Pancreatic cancer Sister    Diabetes Maternal Aunt    Sleep apnea Son    Breast cancer Neg Hx     SOCIAL HISTORY: Social History   Socioeconomic History   Marital status: Divorced    Spouse name: Not on file   Number of children: Not on file   Years of education: Not on file   Highest education level: Not on file  Occupational History   Not on file  Tobacco Use   Smoking status: Former    Current packs/day: 0.00    Average packs/day: 0.3 packs/day for 40.0 years (12.0 ttl pk-yrs)    Types: Cigarettes    Quit date: 2025    Years since quitting: 0.3   Smokeless tobacco: Never   Tobacco comments:    Pt states she is smoking about 5 cigarettes a day  Vaping Use   Vaping status: Never Used  Substance and Sexual Activity   Alcohol use: Yes    Alcohol/week: 0.0 standard drinks of alcohol    Comment: seldom   Drug use: No   Sexual activity: Yes  Other Topics Concern   Not on file  Social History Narrative   ** Merged History Encounter **       Social Drivers of Health   Financial Resource Strain: Not on file  Food Insecurity: No Food Insecurity (12/18/2023)   Hunger Vital Sign    Worried About Running Out of Food in the Last Year: Never true    Ran Out of Food in the Last Year: Never true  Transportation Needs: No Transportation Needs (12/18/2023)   PRAPARE - Administrator, Civil Service (Medical): No    Lack of Transportation (Non-Medical): No  Physical Activity: Not on file  Stress: Not on file   Social Connections: Moderately Integrated (12/18/2023)   Social Connection and Isolation Panel [NHANES]    Frequency of Communication with Friends and Family: Three times a week    Frequency of Social Gatherings with Friends and Family: Three times a week    Attends Religious Services: More than 4 times per year    Active Member of Clubs or Organizations: Yes    Attends Banker Meetings: More than 4 times per year    Marital Status: Widowed    MEDICATIONS:  Current Outpatient Medications  Medication Sig Dispense Refill   ascorbic acid (VITAMIN C) 500 MG tablet Take 500 mg by mouth daily.     BLACK  CURRANT SEED OIL PO Take 1 capsule by mouth daily.     carvedilol  (COREG ) 6.25 MG tablet Take 1 tablet (6.25 mg total) by mouth 2 (two) times daily. 180 tablet 3   cholecalciferol (VITAMIN D3) 25 MCG (1000 UNIT) tablet Take 1,000 Units by mouth daily.     EPINEPHrine  0.3 mg/0.3 mL IJ SOAJ injection Inject 0.3 mg into the muscle as needed for anaphylaxis. 1 each 0   irbesartan  (AVAPRO ) 150 MG tablet Take 1 tablet (150 mg total) by mouth daily. 90 tablet 3   levothyroxine  (SYNTHROID ) 100 MCG tablet Take 100 mcg by mouth daily before breakfast.     magnesium  oxide (MAG-OX) 400 (240 Mg) MG tablet Take 400 mg by mouth daily.     nitroGLYCERIN  (NITROSTAT ) 0.4 MG SL tablet Place 1 tablet (0.4 mg total) under the tongue every 5 (five) minutes as needed for chest pain (may take up to 3 tablets). 25 tablet 1   Probiotic Product (PROBIOTIC DAILY) CAPS Take 1 capsule by mouth daily.     anastrozole  (ARIMIDEX ) 1 MG tablet Take 1 tablet (1 mg total) by mouth daily. (Patient not taking: Reported on 12/24/2023) 90 tablet 3   ezetimibe  (ZETIA ) 10 MG tablet TAKE 1 TABLET BY MOUTH EVERY DAY (Patient not taking: Reported on 12/24/2023) 90 tablet 2   pravastatin  (PRAVACHOL ) 20 MG tablet TAKE 1 TABLET BY MOUTH EVERY DAY IN THE EVENING (Patient not taking: Reported on 12/24/2023) 90 tablet 0   traMADol   (ULTRAM ) 50 MG tablet Take 1 tablet (50 mg total) by mouth every 6 (six) hours as needed (mild pain). (Patient not taking: Reported on 12/24/2023) 30 tablet 0   valACYclovir  (VALTREX ) 500 MG tablet Take 500 mg by mouth daily. (Patient not taking: Reported on 12/24/2023)     No current facility-administered medications for this visit.    PHYSICAL EXAM: Vitals:   12/24/23 1323  BP: 132/80  Pulse: 68  Resp: 20  SpO2: 98%  Weight: 128 lb 9.6 oz (58.3 kg)  Height: 5\' 7"  (1.702 m)   Body mass index is 20.14 kg/m.  Wt Readings from Last 3 Encounters:  12/24/23 128 lb 9.6 oz (58.3 kg)  12/18/23 120 lb (54.4 kg)  12/12/23 129 lb (58.5 kg)     General: Well developed, well nourished female in no apparent distress.  HEENT: AT/[place], no external lesions. Hearing intact to the spoken word Eyes: Conjunctiva clear and no icterus. Abdomen: Soft, non tender, non distended Neurologic: Alert, oriented, normal speech, deep tendon biceps reflexes normal,  no gross focal neurological deficit Extremities: No pedal pitting edema, no tremors of outstretched hands Skin: Warm, color good.  Dry skin present. Psychiatric: Does not appear depressed or anxious  PERTINENT HISTORIC LABORATORY AND IMAGING STUDIES:  All pertinent laboratory results were reviewed. Please see HPI also for further details.   TSH  Date Value Ref Range Status  06/26/2023 22.51 (H) 0.35 - 5.50 uIU/mL Final  04/24/2023 0.16 (L) 0.35 - 5.50 uIU/mL Final  03/11/2023 21.57 (H) 0.35 - 5.50 uIU/mL Final     ASSESSMENT / PLAN  1. Hypothyroidism, postradioiodine therapy   2. Osteoporosis, post-menopausal   3. H/O Graves' disease   4. Vitamin D  deficiency    -She has postablative hypothyroidism.  Currently taking levothyroxine  100 mcg daily.    Plan: -Check thyroid  test TSH, free T4 today and adjust the dose as needed.  Patient needs refill of levothyroxine .  # Osteoporosis. -Last DEXA scan in November 2022, consistent with  osteoporosis with lowest T-score of -2.7 at lumbar spine. -She is currently on Reclast  5 mg IV infusion annually.  Last Reclast  was in January 2024. -Continue current dose of calcium and vitamin D  supplement.  Discussed fall precautions.  Weightbearing exercise as tolerated. - Will check vitamin D  level today.  She is currently taking vitamin D3 1000 international unit daily. -She was due for Reclast  in January/February 2025 however hold off for now until being treated for recently diagnosed breast cancer.   Diagnoses and all orders for this visit:  Hypothyroidism, postradioiodine therapy -     T4, free -     TSH  Osteoporosis, post-menopausal -     VITAMIN D  25 Hydroxy (Vit-D Deficiency, Fractures)  H/O Graves' disease  Vitamin D  deficiency -     VITAMIN D  25 Hydroxy (Vit-D Deficiency, Fractures)     DISPOSITION Follow up in clinic in 3 months suggested.   All questions answered and patient verbalized understanding of the plan.  Victoria Idalys Konecny, MD [facility] [address] [place], [address] [place], [place] [address] Phone # [phone]  At least part of this note was generated using voice recognition software. Inadvertent word errors may have occurred, which were not recognized during the proofreading process.   Addendum:  Labs reviewed low TSH with normal free T4 and low vitamin D  level.  Decrease levothyroxine  from 100 to 88 mcg daily.  Increase vitamin D3 from 1000 to 2000 international unit daily over-the-counter.  I have sent new prescription for levothyroxine .  Arrange for thyroid  lab few days visit prior to follow-up visit with me in August.   Latest Reference Range & Units 12/24/23 13:45  Vitamin D , 25-Hydroxy 30 - 100 ng/mL 22 (L)  TSH 0.40 - 4.50 mIU/L 0.10 (L)  T4,Free(Direct) 0.8 - 1.8 ng/dL 1.7  (L): Data is abnormally low

## 2023-12-27 ENCOUNTER — Encounter: Payer: Self-pay | Admitting: Endocrinology

## 2023-12-27 ENCOUNTER — Telehealth: Payer: Self-pay

## 2023-12-27 MED ORDER — LEVOTHYROXINE SODIUM 88 MCG PO TABS: 88.0000 ug | ORAL_TABLET | Freq: Every day | ORAL | 3 refills | Status: AC

## 2023-12-27 NOTE — Telephone Encounter (Signed)
-----   Message from [place] Thapa sent at 12/27/2023  9:22 AM EDT ----- Please notify patient of Labs reviewed low TSH with normal free T4 and low vitamin D  level.  Decrease levothyroxine  from 100 to 88 mcg daily.  Increase vitamin D3 from 1000 to 2000 international unit daily over-the-counter.  I have sent new prescription for levothyroxine .  Arrange for thyroid  lab few days visit prior to follow-up visit with me in August.

## 2023-12-27 NOTE — Telephone Encounter (Signed)
 Patient given results and medication changes as directed by MD. No further questions at this time. Patient transferred to front desk to place lab appointment.

## 2023-12-27 NOTE — Addendum Note (Signed)
 Addended by: Caprice Wasko, [place] on: 12/27/2023 09:22 AM   Modules accepted: Orders

## 2023-12-31 ENCOUNTER — Inpatient Hospital Stay: Attending: Hematology and Oncology | Admitting: Hematology and Oncology

## 2023-12-31 VITALS — BP 164/88 | HR 72 | Temp 97.7°F | Resp 20 | Ht 67.0 in | Wt 128.7 lb

## 2023-12-31 DIAGNOSIS — Z9011 Acquired absence of right breast and nipple: Secondary | ICD-10-CM | POA: Diagnosis not present

## 2023-12-31 DIAGNOSIS — Z800 Family history of malignant neoplasm of digestive organs: Secondary | ICD-10-CM | POA: Diagnosis not present

## 2023-12-31 DIAGNOSIS — Z170 Estrogen receptor positive status [ER+]: Secondary | ICD-10-CM | POA: Insufficient documentation

## 2023-12-31 DIAGNOSIS — Z1731 Human epidermal growth factor receptor 2 positive status: Secondary | ICD-10-CM | POA: Insufficient documentation

## 2023-12-31 DIAGNOSIS — Z79811 Long term (current) use of aromatase inhibitors: Secondary | ICD-10-CM | POA: Diagnosis not present

## 2023-12-31 DIAGNOSIS — Z79899 Other long term (current) drug therapy: Secondary | ICD-10-CM | POA: Diagnosis not present

## 2023-12-31 DIAGNOSIS — C50411 Malignant neoplasm of upper-outer quadrant of right female breast: Secondary | ICD-10-CM | POA: Insufficient documentation

## 2023-12-31 DIAGNOSIS — Z1721 Progesterone receptor positive status: Secondary | ICD-10-CM | POA: Diagnosis not present

## 2023-12-31 DIAGNOSIS — Z923 Personal history of irradiation: Secondary | ICD-10-CM | POA: Insufficient documentation

## 2023-12-31 MED ORDER — ANASTROZOLE 1 MG PO TABS: 1.0000 mg | ORAL_TABLET | Freq: Every day | ORAL | 3 refills | Status: AC

## 2023-12-31 NOTE — Progress Notes (Signed)
 Patient Care Team: Ruven Coy, MD as PCP - General (Family Medicine) Jann Melody, MD as PCP - Cardiology (Cardiology) Auther Bo, RN as Oncology Nurse Navigator Alane Hsu, RN as Oncology Nurse Navigator Cameron Cea, MD as Consulting Physician (Hematology and Oncology) Dareen Ebbing, MD as Consulting Physician (General Surgery)  DIAGNOSIS:  Encounter Diagnosis  Name Primary?   Malignant neoplasm of upper-outer quadrant of right breast in female, estrogen receptor positive (HCC) Yes    SUMMARY OF ONCOLOGIC HISTORY: Oncology History  Malignant neoplasm of upper-outer quadrant of right breast in female, estrogen receptor positive (HCC)  12/17/2021 Surgery   Right mastectomy: Grade 2 IDC 3 cm with intermediate grade DCIS, margins negative, LVI not identified, 0/2 lymph nodes negative, ER 95%, PR 95%, HER2 3+ positive, Ki-67 20% No role of radiation since she had prior radiation   09/06/2023 Initial Diagnosis   Malignant neoplasm of upper-outer quadrant of right breast in female, estrogen receptor positive (HCC)   12/31/2023 -  Anti-estrogen oral therapy   Anastrozole  along with Herceptin     CHIEF COMPLIANT: Follow-up after recent surgery to discuss treatment plan  HISTORY OF PRESENT ILLNESS: History of Present Illness Victoria Blair "[name]" is a 78 year old female with breast cancer who presents for post-surgical follow-up and discussion of further treatment options.  She underwent surgery for breast cancer on December 25, 2023. The surgical site is healing well, with minimal fluid discharge managed by a compression bra. The pathology report indicates a 3 cm triple positive tumor, with two lymph nodes testing negative for cancer.  Her family history includes a younger sister who underwent chemotherapy and another sister in hospice with stomach cancer.  She is not on chemotherapy due to age and health considerations. She takes calcium and vitamin D   supplements for bone density and engages in regular physical activity, including walking, yoga, and tai chi.  No symptoms are reported during the review of systems.     ALLERGIES:  is allergic to erythromycin base, hydrocodone -acetaminophen , carbamide peroxide, lipitor [atorvastatin], and lisinopril .  MEDICATIONS:  Current Outpatient Medications  Medication Sig Dispense Refill   anastrozole  (ARIMIDEX ) 1 MG tablet Take 1 tablet (1 mg total) by mouth daily. 90 tablet 3   ascorbic acid (VITAMIN C) 500 MG tablet Take 500 mg by mouth daily.     BLACK CURRANT SEED OIL PO Take 1 capsule by mouth daily.     carvedilol  (COREG ) 6.25 MG tablet Take 1 tablet (6.25 mg total) by mouth 2 (two) times daily. 180 tablet 3   cholecalciferol (VITAMIN D3) 25 MCG (1000 UNIT) tablet Take 1,000 Units by mouth daily.     EPINEPHrine  0.3 mg/0.3 mL IJ SOAJ injection Inject 0.3 mg into the muscle as needed for anaphylaxis. 1 each 0   irbesartan  (AVAPRO ) 150 MG tablet Take 1 tablet (150 mg total) by mouth daily. 90 tablet 3   levothyroxine  (SYNTHROID ) 88 MCG tablet Take 1 tablet (88 mcg total) by mouth daily before breakfast. 90 tablet 3   magnesium  oxide (MAG-OX) 400 (240 Mg) MG tablet Take 400 mg by mouth daily.     nitroGLYCERIN  (NITROSTAT ) 0.4 MG SL tablet Place 1 tablet (0.4 mg total) under the tongue every 5 (five) minutes as needed for chest pain (may take up to 3 tablets). 25 tablet 1   Probiotic Product (PROBIOTIC DAILY) CAPS Take 1 capsule by mouth daily.     No current facility-administered medications for this visit.    PHYSICAL  EXAMINATION: ECOG PERFORMANCE STATUS: 1 - Symptomatic but completely ambulatory  Vitals:   12/31/23 1140  BP: (!) 164/88  Pulse: 72  Resp: 20  Temp: 97.7 F (36.5 C)  SpO2: 100%   Filed Weights   12/31/23 1140  Weight: 128 lb 11.2 oz (58.4 kg)    LABORATORY DATA:  I have reviewed the data as listed    Latest Ref Rng & Units 11/29/2023   10:08 AM 03/11/2023    8:29  AM 12/26/2022   11:23 AM  CMP  Glucose 70 - 99 mg/dL 98  88  94   BUN 8 - 23 mg/dL 17  16  23    Creatinine 0.44 - 1.00 mg/dL 8.65  7.84  6.96   Sodium 135 - 145 mmol/L 140  139  142   Potassium 3.5 - 5.1 mmol/L 4.4  3.9  4.2   Chloride 98 - 111 mmol/L 105  103  104   CO2 22 - 32 mmol/L 27  29  26    Calcium 8.9 - 10.3 mg/dL 9.1  9.7  9.3     Lab Results  Component Value Date   WBC 5.9 11/29/2023   HGB 12.2 11/29/2023   HCT 38.6 11/29/2023   MCV 94.4 11/29/2023   PLT 274 11/29/2023   NEUTROABS 3.0 10/29/2022    ASSESSMENT & PLAN:  Malignant neoplasm of upper-outer quadrant of right breast in female, estrogen receptor positive (HCC) 08/13/2023: History of prior lumpectomy and radiation. 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%, PR 95%, HER2 3+ positive, Ki-67 20%   12/17/2021: Right mastectomy: Grade 2 IDC 3 cm with intermediate grade DCIS, margins negative, LVI not identified, 0/2 lymph nodes negative, ER 95%, PR 95%, HER2 3+ positive, Ki-67 20% No role of radiation since she had prior radiation  Pathology counseling: I discussed the final pathology report of the patient provided  a copy of this report. I discussed the margins as well as lymph node surgeries. We also discussed the final staging along with previously performed ER/PR and HER-2/neu testing.  Treatment plan: Ideally patient should receive adjuvant chemotherapy but given her performance status, we are planning to initiate treatment with adjuvant Herceptin adjuvant antiestrogen therapy with anastrozole  1 mg daily x 5 to 7 years  Returns to clinic to start treatments with Herceptin injections      Orders Placed This Encounter  Procedures   Ambulatory referral to Physical Therapy    Referral Priority:   Routine    Referral Type:   Physical Medicine    Referral Reason:   Specialty Services Required     Requested Specialty:   Physical Therapy    Number of Visits Requested:   1   The patient has a good understanding of the overall plan. she agrees with it. she will call with any problems that may develop before the next visit here. Total time spent: 45 mins including face to face time and time spent for planning, charting and co-ordination of care   Margert Sheerer, MD 12/31/23

## 2023-12-31 NOTE — Progress Notes (Signed)
 START OFF PATHWAY REGIMEN - Breast   [id number]:Trastuzumab and hyaluronidase-oysk 600 mg/10,000 units SUBQ D1 q21 Days:   A cycle is every 21 days:     Trastuzumab and hyaluronidase-oysk   **Always confirm dose/schedule in your pharmacy ordering system**  Patient Characteristics: Postoperative without Neoadjuvant Therapy, M0 (Pathologic Staging), Invasive Disease, Adjuvant Therapy, HER2 Positive, ER Positive, Node Negative, pT2, pN0, Tumor Size ?  3 cm Therapeutic Status: Postoperative without Neoadjuvant Therapy, M0 (Pathologic Staging) AJCC Grade: G2 AJCC N Category: pN0 AJCC M Category: cM0 ER Status: Positive (+) AJCC 8 Stage Grouping: IA HER2 Status: Positive (+) Oncotype Dx Recurrence Score: Not Appropriate AJCC T Category: pT2 PR Status: Positive (+) Intent of Therapy: Curative Intent, Discussed with Patient

## 2023-12-31 NOTE — Assessment & Plan Note (Signed)
 08/13/2023: History of prior lumpectomy and radiation. 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%, PR 95%, HER2 3+ positive, Ki-67 20%   12/17/2021: Right mastectomy: Grade 2 IDC 3 cm with intermediate grade DCIS, margins negative, LVI not identified, 0/2 lymph nodes negative, ER 95%, PR 95%, HER2 3+ positive, Ki-67 20% No role of radiation since she had prior radiation  Pathology counseling: I discussed the final pathology report of the patient provided  a copy of this report. I discussed the margins as well as lymph node surgeries. We also discussed the final staging along with previously performed ER/PR and HER-2/neu testing.  Treatment plan: Ideally patient should receive adjuvant chemotherapy but given her performance status, we are planning to initiate treatment with adjuvant Herceptin adjuvant antiestrogen therapy with anastrozole  1 mg daily x 5 to 7 years  Returns to clinic to start treatments with Herceptin injections

## 2024-01-01 ENCOUNTER — Other Ambulatory Visit: Payer: Self-pay

## 2024-01-01 ENCOUNTER — Telehealth: Payer: Self-pay | Admitting: Hematology and Oncology

## 2024-01-01 NOTE — Telephone Encounter (Signed)
 Left vm about scheduled appt dates and times.

## 2024-01-02 ENCOUNTER — Encounter: Payer: Self-pay | Admitting: *Deleted

## 2024-01-07 DIAGNOSIS — E890 Postprocedural hypothyroidism: Secondary | ICD-10-CM | POA: Diagnosis not present

## 2024-01-07 DIAGNOSIS — E559 Vitamin D deficiency, unspecified: Secondary | ICD-10-CM | POA: Diagnosis not present

## 2024-01-07 DIAGNOSIS — D509 Iron deficiency anemia, unspecified: Secondary | ICD-10-CM | POA: Diagnosis not present

## 2024-01-07 DIAGNOSIS — E7800 Pure hypercholesterolemia, unspecified: Secondary | ICD-10-CM | POA: Diagnosis not present

## 2024-01-07 NOTE — Progress Notes (Signed)
 Pharmacist Chemotherapy Monitoring - Initial Assessment    Anticipated start date: 01/14/24   The following has been reviewed per standard work regarding the patient's treatment regimen: The patient's diagnosis, treatment plan and drug doses, and organ/hematologic function Lab orders and baseline tests specific to treatment regimen  The treatment plan start date, drug sequencing, and pre-medications Prior authorization status  Patient's documented medication list, including drug-drug interaction screen and prescriptions for anti-emetics and supportive care specific to the treatment regimen The drug concentrations, fluid compatibility, administration routes, and timing of the medications to be used The patient's access for treatment and lifetime cumulative dose history, if applicable  The patient's medication allergies and previous infusion related reactions, if applicable   Changes made to treatment plan:  N/A  Follow up needed:  Pending authorization for treatment    Cherylynn Cosier, RPH, 01/07/2024  8:22 AM

## 2024-01-10 ENCOUNTER — Encounter: Payer: Self-pay | Admitting: Hematology and Oncology

## 2024-01-14 ENCOUNTER — Telehealth: Payer: Self-pay

## 2024-01-14 ENCOUNTER — Inpatient Hospital Stay

## 2024-01-14 NOTE — Telephone Encounter (Signed)
 I called patient because she was 38 mins late for her Infusion appt. I left a Voicemail asking her to call back to reschedule her appt.

## 2024-01-21 ENCOUNTER — Other Ambulatory Visit: Payer: Self-pay | Admitting: *Deleted

## 2024-01-21 DIAGNOSIS — I700 Atherosclerosis of aorta: Secondary | ICD-10-CM

## 2024-01-31 ENCOUNTER — Other Ambulatory Visit: Payer: Self-pay

## 2024-02-04 ENCOUNTER — Inpatient Hospital Stay: Admitting: Hematology and Oncology

## 2024-02-04 ENCOUNTER — Inpatient Hospital Stay

## 2024-02-04 ENCOUNTER — Inpatient Hospital Stay: Admitting: Genetic Counselor

## 2024-02-04 ENCOUNTER — Telehealth: Payer: Self-pay | Admitting: *Deleted

## 2024-02-04 DIAGNOSIS — Z170 Estrogen receptor positive status [ER+]: Secondary | ICD-10-CM

## 2024-02-04 NOTE — Assessment & Plan Note (Deleted)
 08/13/2023: History of prior lumpectomy and radiation. 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%, PR 95%, HER2 3+ positive, Ki-67 20%    12/17/2021: Right mastectomy: Grade 2 IDC 3 cm with intermediate grade DCIS, margins negative, LVI not identified, 0/2 lymph nodes negative, ER 95%, PR 95%, HER2 3+ positive, Ki-67 20% No role of radiation since she had prior radiation   Treatment plan: Ideally patient should receive adjuvant chemotherapy but given her performance status, we are planning to initiate treatment with adjuvant Herceptin adjuvant antiestrogen therapy with anastrozole  1 mg daily x 5 to 7 years ------------------------------------------------------------------------------------------------------------------------------------------------- Current treatment: Herceptin maintenance therapy along with anastrozole  today cycle 1

## 2024-02-04 NOTE — Telephone Encounter (Signed)
 RN placed call to pt regarding missed appt today.  Pt states she was not aware of the appt and would like to re-schedule.  Message sent to scheduling team.

## 2024-02-05 ENCOUNTER — Other Ambulatory Visit: Payer: Self-pay

## 2024-02-07 ENCOUNTER — Other Ambulatory Visit: Payer: Self-pay | Admitting: *Deleted

## 2024-02-07 DIAGNOSIS — Z170 Estrogen receptor positive status [ER+]: Secondary | ICD-10-CM

## 2024-02-10 ENCOUNTER — Inpatient Hospital Stay

## 2024-02-10 ENCOUNTER — Inpatient Hospital Stay: Attending: Hematology and Oncology

## 2024-02-10 ENCOUNTER — Telehealth: Payer: Self-pay | Admitting: Adult Health

## 2024-02-10 ENCOUNTER — Inpatient Hospital Stay: Admitting: Adult Health

## 2024-02-10 NOTE — Telephone Encounter (Signed)
 TC to Pt to inquire about missed appointment.Spoke with Pt who stated she was having car trouble. Informed Victoria Blair who stated Pt would need to be rescheduled. Message sent to scheduling.

## 2024-02-10 NOTE — Progress Notes (Deleted)
 [facility] Cancer Follow up:    Ruven Coy, MD [address] [place] [address]   DIAGNOSIS:  Cancer Staging  Malignant neoplasm of upper-outer quadrant of right breast in female, estrogen receptor positive (HCC) Staging form: Breast, AJCC 8th Edition - Pathologic stage from 12/18/2023: Stage IA (pT2, pN0, cM0, G2, ER+, PR+, HER2+) - Signed by Percival Brace, NP on 02/10/2024 Stage prefix: Initial diagnosis Histologic grading system: 3 grade system    SUMMARY OF ONCOLOGIC HISTORY: Oncology History  Malignant neoplasm of upper-outer quadrant of right breast in female, estrogen receptor positive (HCC)  08/13/2023 Surgery   History of prior lumpectomy and radiation (1998). 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%, PR 95%, HER2 3+ positive, Ki-67 20%    12/18/2023 Surgery   Right mastectomy: Grade 2 IDC 3 cm with intermediate grade DCIS, margins negative, LVI not identified, 0/2 lymph nodes negative, ER 95%, PR 95%, HER2 3+ positive, Ki-67 20% No role of radiation since she had prior radiation   12/18/2023 Cancer Staging   Staging form: Breast, AJCC 8th Edition - Pathologic stage from 12/18/2023: Stage IA (pT2, pN0, cM0, G2, ER+, PR+, HER2+) - Signed by Percival Brace, NP on 02/10/2024 Stage prefix: Initial diagnosis Histologic grading system: 3 grade system   12/31/2023 -  Anti-estrogen oral therapy   Anastrozole  along with Herceptin   02/04/2024 -  Chemotherapy   Patient is on Treatment Plan : BREAST MAINTENANCE Trastuzumab IV (6) or SQ (600) D1 q21d x 13 cycles       CURRENT THERAPY: Anastrozole /Herceptin  INTERVAL HISTORY:  Discussed the use of AI scribe software for clinical note transcription with the patient, who gave verbal consent to proceed.  Victoria Blair 78 y.o. 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) 12/18/2023   Malignant neoplasm of upper-outer quadrant of right breast in female, estrogen receptor positive (HCC) 09/06/2023   Vitamin D  deficiency 05/01/2023   H/O Graves' disease 05/01/2023   Aortic insufficiency 02/11/2023   Osteoporosis, post-menopausal 09/12/2022   Myalgia due to statin 06/28/2022   Syncope and collapse 11/23/2021   Aortic atherosclerosis (HCC) 11/23/2021   Coronary artery disease of native artery of native heart with stable angina pectoris (HCC) 11/23/2021   Lung nodule 12/31/2013   Tobacco abuse 12/31/2013   Essential hypertension 09/03/2013   Hypothyroidism, postradioiodine therapy 05/26/2013   Pure hypercholesterolemia 05/26/2013   DEPRESSION 04/26/2007   Osteopenia 04/26/2007   BREAST CANCER, HX OF 04/26/2007    is allergic to erythromycin base, hydrocodone -acetaminophen , carbamide peroxide, lipitor [atorvastatin], and lisinopril .  MEDICAL HISTORY: Past Medical History:  Diagnosis Date   Aortic insufficiency 02/11/2023   TTE 01/23/23: EF 65-70, no RWMA, mod LVH, Gr 1 DD, NL RVSF, NL PASP, RVSP 29.4, mod AI, RAP 3   Breast cancer (HCC) 1997   Depression    GERD (gastroesophageal reflux disease)    Graves disease    Hemorrhoids    Hypertension    Hypothyroidism    Migraines    Osteoporosis    Personal history of radiation therapy 1998   Vitamin D  deficiency     SURGICAL HISTORY: Past Surgical History:  Procedure Laterality Date   AXILLARY SENTINEL NODE BIOPSY Right 12/18/2023   Procedure: RIGHT AXILLARY SENTINEL LYMPH  NODE;  Surgeon: Dareen Ebbing, MD;  Location: [facility] OR;  Service: General;  Laterality: Right;   BREAST BIOPSY Right 08/11/1996   BREAST BIOPSY Right 08/13/2023   US  RT BREAST BX W LOC DEV 1ST LESION IMG BX SPEC US  GUIDE 08/13/2023 [facility] MAMMOGRAPHY   BREAST LUMPECTOMY Right 1998   CESAREAN SECTION     CHOLECYSTECTOMY      MASTECTOMY W/ SENTINEL NODE BIOPSY Right 12/18/2023   Procedure: RIGHT MASTECTOMY;  Surgeon: Dareen Ebbing, MD;  Location: [facility] OR;  Service: General;  Laterality: Right;  RIGHT MASTECTOMY   SPINE SURGERY      SOCIAL HISTORY: Social History   Socioeconomic History   Marital status: Divorced    Spouse name: Not on file   Number of children: Not on file   Years of education: Not on file   Highest education level: Not on file  Occupational History   Not on file  Tobacco Use   Smoking status: Former    Current packs/day: 0.00    Average packs/day: 0.3 packs/day for 40.0 years (12.0 ttl pk-yrs)    Types: Cigarettes    Quit date: 2025    Years since quitting: 0.4   Smokeless tobacco: Never   Tobacco comments:    Pt states she is smoking about 5 cigarettes a day  Vaping Use   Vaping status: Never Used  Substance and Sexual Activity   Alcohol use: Yes    Alcohol/week: 0.0 standard drinks of alcohol    Comment: seldom   Drug use: No   Sexual activity: Yes  Other Topics Concern   Not on file  Social History Narrative   ** Merged History Encounter **       Social Drivers of Health   Financial Resource Strain: Not on file  Food Insecurity: No Food Insecurity (12/18/2023)   Hunger Vital Sign    Worried About Running Out of Food in the Last Year: Never true    Ran Out of Food in the Last Year: Never true  Transportation Needs: No Transportation Needs (12/18/2023)   PRAPARE - Administrator, Civil Service (Medical): No    Lack of Transportation (Non-Medical): No  Physical Activity: Not on file  Stress: Not on file  Social Connections: Moderately Integrated (12/18/2023)   Social Connection and Isolation Panel    Frequency of Communication with Friends and Family: Three times a week    Frequency of Social Gatherings with Friends and Family: Three times a week    Attends Religious Services: More than 4 times per year    Active Member of Clubs or Organizations: Yes     Attends Banker Meetings: More than 4 times per year    Marital Status: Widowed  Intimate Partner Violence: Not At Risk (12/18/2023)   Humiliation, Afraid, Rape, and Kick questionnaire    Fear of Current or Ex-Partner: No    Emotionally Abused: No    Physically Abused: No    Sexually Abused: No    FAMILY HISTORY: Family History  Problem Relation Age of Onset   Heart disease Father    Pulmonary fibrosis Father    Hypertension Father    Pancreatic cancer Sister    Diabetes Maternal Aunt    Sleep apnea Son    Breast cancer Neg Hx     Review of Systems  Constitutional:  Negative for appetite change, chills, fatigue, fever and unexpected weight change.  HENT:   Negative for hearing loss, lump/mass and trouble  swallowing.   Eyes:  Negative for eye problems and icterus.  Respiratory:  Negative for chest tightness, cough and shortness of breath.   Cardiovascular:  Negative for chest pain, leg swelling and palpitations.  Gastrointestinal:  Negative for abdominal distention, abdominal pain, constipation, diarrhea, nausea and vomiting.  Endocrine: Negative for hot flashes.  Genitourinary:  Negative for difficulty urinating.   Musculoskeletal:  Negative for arthralgias.  Skin:  Negative for itching and rash.  Neurological:  Negative for dizziness, extremity weakness, headaches and numbness.  Hematological:  Negative for adenopathy. Does not bruise/bleed easily.  Psychiatric/Behavioral:  Negative for depression. The patient is not nervous/anxious.       PHYSICAL EXAMINATION    There were no vitals filed for this visit.  Physical Exam Constitutional:      General: She is not in acute distress.    Appearance: Normal appearance. She is not toxic-appearing.  HENT:     Head: Normocephalic and atraumatic.     Mouth/Throat:     Mouth: Mucous membranes are moist.     Pharynx: Oropharynx is clear. No oropharyngeal exudate or posterior oropharyngeal erythema.   Eyes:      General: No scleral icterus.   Cardiovascular:     Rate and Rhythm: Normal rate and regular rhythm.     Pulses: Normal pulses.     Heart sounds: Normal heart sounds.  Pulmonary:     Effort: Pulmonary effort is normal.     Breath sounds: Normal breath sounds.  Abdominal:     General: Abdomen is flat. Bowel sounds are normal. There is no distension.     Palpations: Abdomen is soft.     Tenderness: There is no abdominal tenderness.   Musculoskeletal:        General: No swelling.     Cervical back: Neck supple.  Lymphadenopathy:     Cervical: No cervical adenopathy.   Skin:    General: Skin is warm and dry.     Findings: No rash.   Neurological:     General: No focal deficit present.     Mental Status: She is alert.   Psychiatric:        Mood and Affect: Mood normal.        Behavior: Behavior normal.     LABORATORY DATA:  CBC    Component Value Date/Time   WBC 5.9 11/29/2023 1008   RBC 4.09 11/29/2023 1008   HGB 12.2 11/29/2023 1008   HGB 11.4 12/26/2022 1123   HCT 38.6 11/29/2023 1008   HCT 35.1 12/26/2022 1123   PLT 274 11/29/2023 1008   PLT 245 12/26/2022 1123   MCV 94.4 11/29/2023 1008   MCV 95 12/26/2022 1123   MCH 29.8 11/29/2023 1008   MCHC 31.6 11/29/2023 1008   RDW 13.4 11/29/2023 1008   RDW 12.3 12/26/2022 1123   LYMPHSABS 1.3 10/29/2022 1640   MONOABS 0.8 10/29/2022 1640   EOSABS 0.0 10/29/2022 1640   BASOSABS 0.0 10/29/2022 1640    CMP     Component Value Date/Time   NA 140 11/29/2023 1008   NA 142 12/26/2022 1123   K 4.4 11/29/2023 1008   CL 105 11/29/2023 1008   CO2 27 11/29/2023 1008   GLUCOSE 98 11/29/2023 1008   BUN 17 11/29/2023 1008   BUN 23 12/26/2022 1123   CREATININE 0.83 11/29/2023 1008   CALCIUM 9.1 11/29/2023 1008   PROT 6.1 (L) 11/24/2022 1630   ALBUMIN  3.5 11/24/2022 1630   AST 27 11/24/2022 1630  ALT 22 11/24/2022 1630   ALKPHOS 37 (L) 11/24/2022 1630   BILITOT 0.6 11/24/2022 1630   GFRNONAA >60 11/29/2023 1008      ASSESSMENT and THERAPY PLAN:   No problem-specific Assessment & Plan notes found for this encounter.     All questions were answered. The patient knows to call the clinic with any problems, questions or concerns. We can certainly see the patient much sooner if necessary.  Total encounter time:*** minutes*in face-to-face visit time, chart review, lab review, care coordination, order entry, and documentation of the encounter time.    Alwin Baars, NP 02/10/24 8:35 AM Medical Oncology and Hematology [facility] [address] [place], [place] [address] Tel. [phone]    Fax. [phone]  *Total Encounter Time as defined by the Centers for Medicare and Medicaid Services includes, in addition to the face-to-face time of a patient visit (documented in the note above) non-face-to-face time: obtaining and reviewing outside history, ordering and reviewing medications, tests or procedures, care coordination (communications with other health care professionals or caregivers) and documentation in the medical record.

## 2024-02-13 ENCOUNTER — Encounter: Payer: Self-pay | Admitting: Genetic Counselor

## 2024-02-14 DIAGNOSIS — R410 Disorientation, unspecified: Secondary | ICD-10-CM | POA: Diagnosis not present

## 2024-02-14 DIAGNOSIS — Z6820 Body mass index (BMI) 20.0-20.9, adult: Secondary | ICD-10-CM | POA: Diagnosis not present

## 2024-02-14 DIAGNOSIS — E039 Hypothyroidism, unspecified: Secondary | ICD-10-CM | POA: Diagnosis not present

## 2024-02-14 DIAGNOSIS — R350 Frequency of micturition: Secondary | ICD-10-CM | POA: Diagnosis not present

## 2024-02-19 ENCOUNTER — Other Ambulatory Visit: Payer: Self-pay | Admitting: Family Medicine

## 2024-02-19 DIAGNOSIS — R410 Disorientation, unspecified: Secondary | ICD-10-CM

## 2024-02-21 NOTE — Progress Notes (Signed)
 Pharmacist Chemotherapy Monitoring - Initial Assessment    Anticipated start date: 03/02/24   The following has been reviewed per standard work regarding the patient's treatment regimen: The patient's diagnosis, treatment plan and drug doses, and organ/hematologic function Lab orders and baseline tests specific to treatment regimen  The treatment plan start date, drug sequencing, and pre-medications Prior authorization status  Patient's documented medication list, including drug-drug interaction screen and prescriptions for anti-emetics and supportive care specific to the treatment regimen The drug concentrations, fluid compatibility, administration routes, and timing of the medications to be used The patient's access for treatment and lifetime cumulative dose history, if applicable  The patient's medication allergies and previous infusion related reactions, if applicable   Changes made to treatment plan:  N/A  Follow up needed:  ECHO 7.2.25   Victoria Blair, [place], 02/21/2024  12:29 PM

## 2024-02-24 ENCOUNTER — Encounter: Payer: Self-pay | Admitting: Family Medicine

## 2024-02-25 ENCOUNTER — Inpatient Hospital Stay

## 2024-02-26 ENCOUNTER — Ambulatory Visit (HOSPITAL_COMMUNITY): Attending: Cardiology

## 2024-02-26 ENCOUNTER — Encounter (HOSPITAL_COMMUNITY): Payer: Self-pay | Admitting: Physician Assistant

## 2024-02-27 ENCOUNTER — Encounter: Payer: Self-pay | Admitting: *Deleted

## 2024-02-27 ENCOUNTER — Other Ambulatory Visit: Payer: Self-pay | Admitting: *Deleted

## 2024-02-27 NOTE — Progress Notes (Signed)
 RN second attempt to contact pt regarding missed appts.  No answer, LVM.  RN discussed with MD who states pt has not started tx yet due to no-showing to appts.  All future appts canceled.  Letter mailed to pt requesting pt to contact our office once she is ready to reschedule. Pt will need echo and MD visit prior to rescheduling any future appts.

## 2024-02-29 ENCOUNTER — Other Ambulatory Visit

## 2024-03-02 ENCOUNTER — Inpatient Hospital Stay

## 2024-03-05 ENCOUNTER — Telehealth: Payer: Self-pay | Admitting: Licensed Clinical Social Worker

## 2024-03-05 IMAGING — DX DG CHEST 1V PORT
1 series · 1 of 1 positions shown · non-contrast
Comparison: 12/09/2012 chest radiograph.  01/25/2017 chest CT

CLINICAL DATA: Shortness of breath

EXAM:
PORTABLE CHEST 1 VIEW

[chest ap]
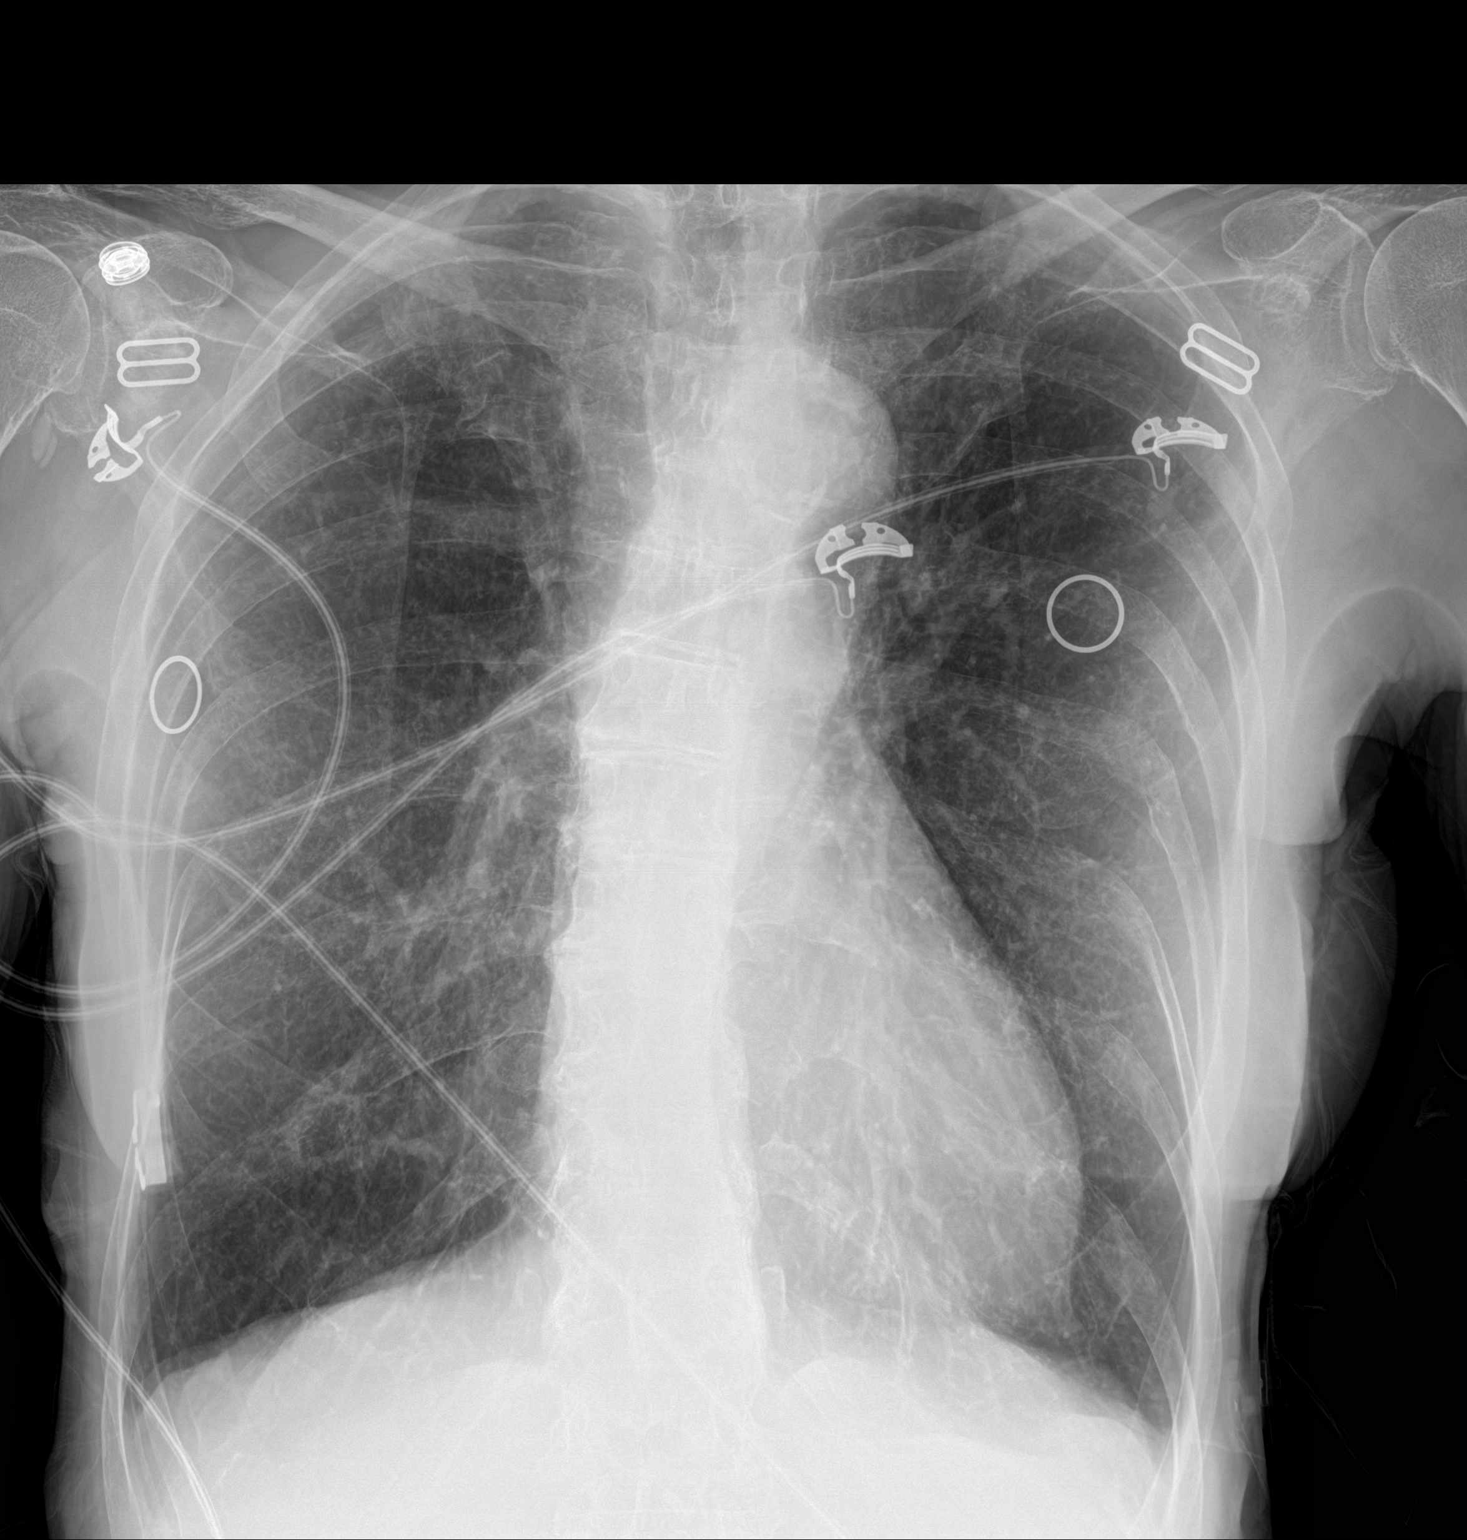

[1 of 1 positions shown; findings below may reference images not displayed]

FINDINGS: The cardiomediastinal silhouette is unremarkable.

Emphysema/COPD changes are again noted.

There is no evidence of focal airspace disease, pulmonary edema,
suspicious pulmonary nodule/mass, pleural effusion, or pneumothorax.

No acute bony abnormalities are identified. Fractures of the LEFT
6th and 7th ribs appear remote but correlate clinically.
IMPRESSION: Emphysema/COPD changes without evidence of acute cardiopulmonary
disease.

LEFT 6 and 7th rib fractures, appear remote but correlate pain.

## 2024-03-05 NOTE — Telephone Encounter (Signed)
 [facility] Clinical Social Work  Received VM from pt requesting a call back, no reason for call left.  CSW attempted to return call. No answer. VM full, unable to leave message.   Sherald Balbuena E Ariv Penrod, LCSW

## 2024-03-09 ENCOUNTER — Telehealth: Payer: Self-pay | Admitting: *Deleted

## 2024-03-09 NOTE — Telephone Encounter (Signed)
 This RN called pt per scheduling request that pt called them for appts.  Noted multiple no shows to start herceptin including need for ECHO.  This RN obtained identified VM- detailed message left to return call to verify understanding of plan and what appts are needed for scheduling purposes.

## 2024-03-11 ENCOUNTER — Other Ambulatory Visit: Payer: Self-pay | Admitting: *Deleted

## 2024-03-11 ENCOUNTER — Other Ambulatory Visit: Payer: Self-pay | Admitting: Pharmacist

## 2024-03-11 ENCOUNTER — Telehealth: Payer: Self-pay | Admitting: *Deleted

## 2024-03-11 NOTE — Telephone Encounter (Signed)
 Received call from pt requesting to be transferred to echo department to schedule echo.  RN transferred pt.  RN will also request scheduling to set up MD and infusion appt post echo.

## 2024-03-12 ENCOUNTER — Other Ambulatory Visit: Payer: Self-pay

## 2024-03-17 ENCOUNTER — Inpatient Hospital Stay: Admitting: Hematology and Oncology

## 2024-03-17 ENCOUNTER — Encounter: Payer: Self-pay | Admitting: *Deleted

## 2024-03-17 ENCOUNTER — Inpatient Hospital Stay

## 2024-03-17 ENCOUNTER — Ambulatory Visit

## 2024-03-23 ENCOUNTER — Ambulatory Visit

## 2024-03-23 ENCOUNTER — Ambulatory Visit: Attending: Internal Medicine | Admitting: Internal Medicine

## 2024-03-23 NOTE — Progress Notes (Deleted)
 Cardiology Office Note:  .    Date:  03/23/2024  ID:  Victoria Blair, DOB 1945-11-18, MRN [id number] PCP: Frederik Charleston, MD  [facility] Providers Cardiologist:  Stanly DELENA Leavens, MD { Click to update primary MD,subspecialty MD or APP then REFRESH:1}    CC: *** Consulted for the evaluation of *** at the behest of ***   History of Present Illness: .    Victoria Blair is a 78 y.o. female ***  Discussed the use of AI scribe software for clinical note transcription with the patient, who gave verbal consent to proceed.   Relevant histories: .  Social *** ROS: As per HPI.   Studies Reviewed: .     Cardiac Studies & Procedures   ______________________________________________________________________________________________   STRESS TESTS  MYOCARDIAL PERFUSION IMAGING 01/03/2022  Interpretation Summary   The study is normal. The study is low risk.   No ST deviation was noted.   LV perfusion is normal. There is no evidence of ischemia. There is no evidence of infarction.   Left ventricular function is normal. Nuclear stress EF: 67 %. The left ventricular ejection fraction is hyperdynamic (>65%). End diastolic cavity size is normal. End systolic cavity size is normal.   ECHOCARDIOGRAM  ECHOCARDIOGRAM COMPLETE 01/23/2023  Narrative ECHOCARDIOGRAM REPORT    Patient Name:   Victoria Blair Date of Exam: 01/23/2023 Medical Rec #:  [id number]          Height:       67.5 in Accession #:    [id number]         Weight:       124.0 lb Date of Birth:  [birth year]           BSA:          1.660 m Patient Age:    76 years           BP:           130/60 mmHg Patient Gender: F                  HR:           83 bpm. Exam Location:  [address]  Procedure: 2D Echo, Cardiac Doppler and Color Doppler  Indications:    R55 Syncope  History:        Patient has prior history of Echocardiogram examinations, most recent 12/11/2021. Risk Factors:Hypertension. History  of breast cancer.  Sonographer:    Carl Coma RDCS Referring Phys: [id number] ROLLO JONELLE LOUDER  IMPRESSIONS   1. Left ventricular ejection fraction, by estimation, is 65 to 70%. The left ventricle has normal function. The left ventricle has no regional wall motion abnormalities. There is moderate concentric left ventricular hypertrophy. Left ventricular diastolic parameters are consistent with Grade I diastolic dysfunction (impaired relaxation). 2. Right ventricular systolic function is normal. The right ventricular size is normal. There is normal pulmonary artery systolic pressure. The estimated right ventricular systolic pressure is 29.4 mmHg. 3. The mitral valve is normal in structure. No evidence of mitral valve regurgitation. No evidence of mitral stenosis. 4. The aortic valve is tricuspid. There is mild calcification of the aortic valve. Aortic valve regurgitation is moderate. No aortic stenosis is present. 5. The inferior vena cava is normal in size with greater than 50% respiratory variability, suggesting right atrial pressure of 3 mmHg.  FINDINGS Left Ventricle: Left ventricular ejection fraction, by estimation, is 65 to 70%. The left ventricle has normal function. The left ventricle  has no regional wall motion abnormalities. The left ventricular internal cavity size was normal in size. There is moderate concentric left ventricular hypertrophy. Left ventricular diastolic parameters are consistent with Grade I diastolic dysfunction (impaired relaxation).  Right Ventricle: The right ventricular size is normal. No increase in right ventricular wall thickness. Right ventricular systolic function is normal. There is normal pulmonary artery systolic pressure. The tricuspid regurgitant velocity is 2.57 m/s, and with an assumed right atrial pressure of 3 mmHg, the estimated right ventricular systolic pressure is 29.4 mmHg.  Left Atrium: Left atrial size was normal in size.  Right  Atrium: Right atrial size was normal in size.  Pericardium: There is no evidence of pericardial effusion.  Mitral Valve: The mitral valve is normal in structure. There is mild calcification of the mitral valve leaflet(s). Mild mitral annular calcification. No evidence of mitral valve regurgitation. No evidence of mitral valve stenosis.  Tricuspid Valve: The tricuspid valve is normal in structure. Tricuspid valve regurgitation is mild.  Aortic Valve: The aortic valve is tricuspid. There is mild calcification of the aortic valve. Aortic valve regurgitation is moderate. Aortic regurgitation PHT measures 323 msec. No aortic stenosis is present.  Pulmonic Valve: The pulmonic valve was normal in structure. Pulmonic valve regurgitation is not visualized.  Aorta: The aortic root is normal in size and structure.  Venous: The inferior vena cava is normal in size with greater than 50% respiratory variability, suggesting right atrial pressure of 3 mmHg.  IAS/Shunts: No atrial level shunt detected by color flow Doppler.   LEFT VENTRICLE PLAX 2D LVIDd:         3.90 cm   Diastology LVIDs:         1.80 cm   LV e' medial:    5.08 cm/s LV PW:         1.00 cm   LV E/e' medial:  11.4 LV IVS:        1.00 cm   LV e' lateral:   5.77 cm/s LVOT diam:     1.90 cm   LV E/e' lateral: 10.0 LV SV:         70 LV SV Index:   42 LVOT Area:     2.84 cm   RIGHT VENTRICLE RV Basal diam:  3.60 cm RV S prime:     16.07 cm/s TAPSE (M-mode): 2.9 cm  LEFT ATRIUM             Index        RIGHT ATRIUM           Index LA diam:        3.10 cm 1.87 cm/m   RA Area:     12.50 cm LA Vol (A2C):   21.9 ml 13.20 ml/m  RA Volume:   29.40 ml  17.72 ml/m LA Vol (A4C):   26.8 ml 16.15 ml/m LA Biplane Vol: 26.2 ml 15.79 ml/m AORTIC VALVE LVOT Vmax:   118.33 cm/s LVOT Vmean:  80.400 cm/s LVOT VTI:    0.245 m AI PHT:      323 msec  AORTA Ao Root diam: 3.20 cm Ao Asc diam:  3.40 cm  MITRAL VALVE                 TRICUSPID VALVE MV Area (PHT): 3.21 cm     TR Peak grad:   26.4 mmHg MV Decel Time: 236 msec     TR Vmax:        257.00 cm/s MV  E velocity: 57.65 cm/s MV A velocity: 100.50 cm/s  SHUNTS MV E/A ratio:  0.57         Systemic VTI:  0.25 m Systemic Diam: 1.90 cm  Dalton McleanMD Electronically signed by Ezra Kanner Signature Date/Time: 01/23/2023/3:57:37 PM    Final    MONITORS  LONG TERM MONITOR (3-14 DAYS) 01/22/2023  Narrative   Patient had a minimum heart rate of 54 bpm, maximum heart rate of 182 bpm, and average heart rate of 80 bpm.   Predominant underlying rhythm was sinus rhythm.   One run of NSVT that lasted 6 beats.   Frequent paroxsymal SVT lasting 13 seconds at longest.   Isolated PACs were rare (<1.0%).   Isolated PVCs were rare (<1.0%).   No triggered and diary events.  Asymptomatic SVT.   CT SCANS  CT CORONARY FRACTIONAL FLOW RESERVE DATA PREP 12/12/2021  Narrative EXAM: CT FFR ANALYSIS  CLINICAL DATA:  Coronary artery disease (CAD) (Ped 0-17y)  FINDINGS: FFRct analysis was performed on the original cardiac CT angiogram dataset. Diagrammatic representation of the FFRct analysis is provided in a separate PDF document in PACS. This dictation was created using the PDF document and an interactive 3D model of the results. 3D model is not available in the EMR/PACS. Normal FFR range is >0.80. Indeterminate (grey) zone is 0.76-0.80.  1. Left Main: FFR = 1.00  2. LAD: Proximal FFR = 0.98, mid FFR = 0.95, distal FFR = 0.88 3. LCX: Proximal FFR = 0.98, distal FFR = not analyzed 4. RCA: Proximal FFR = Unable to be analyzed due to significant artifact.  IMPRESSION: 1.  CT FFR analysis showed no significant stenosis.  RECOMMENDATIONS: Guideline-directed medical therapy and aggressive risk factor modification for secondary prevention of coronary artery disease.   Electronically Signed By: Kardie  Tobb D.O. On: 12/14/2021 19:59   CT CORONARY MORPH  W/CTA COR W/SCORE 12/12/2021  Addendum 12/12/2021  3:06 PM ADDENDUM REPORT: 12/12/2021 15:04  CLINICAL DATA:  This is a 78 year old female with anginal symptoms.  EXAM: Cardiac/Coronary  CTA  TECHNIQUE: The patient was scanned on a [employer].  FINDINGS: A 100 kV prospective scan was triggered in the descending thoracic aorta at 111 HU's. Axial non-contrast 3 mm slices were carried out through the heart. The data set was analyzed on a dedicated work station and scored using the Agatson method. Gantry rotation speed was 250 msecs and collimation was .6 mm. No beta blockade and 0.8 mg of sl NTG was given. The 3D data set was reconstructed in 5% intervals of the 67-82 % of the R-R cycle. Diastolic phases were analyzed on a dedicated work station using MPR, MIP and VRT modes. The patient received 80 cc of contrast.  Image Quality: Fair, with misregistration artifact.  Aorta: Normal size.  No calcifications.  No dissection.  Aortic Valve:  Trileaflet.  No calcifications.  Coronary Arteries:  Normal coronary origin.  Right dominance.  RCA is a large dominant artery that gives rise to PDA and PLA. The RCA is diffusely calcified. There is a long (27 mm) calcified plaque which starts as a mild (25-49%) in the ostium of the RCA and terminates as a moderate (50-69%) plaque. The mid RCA with a moderate calcified plaque in the mid RCA. Due to multiple stair-step artifact the distal RCA is not well visualized- there is calcified plaque present that can not be accurately quantified.  Left main is a large artery that gives rise to LAD and LCX  arteries.  LAD is a large vessel. The proximal LAD with mild calcified lesions. The mid LAD with 2 separate plaques: There is a focal mild soft plaque and right below this is a long 14.4 mm moderate calcified plaque. The distal LAD with minimal luminal irregularities. D1 with mild calcified plaque in the proximal portion of the  vessel.  LCX is a non-dominant artery that gives rise to one large OM1 branch. There is moderate mixed plaque in the proximal LCX. The mid and distal LCX with no plaques.  Coronary Calcium Score:  Left main: 0  Left anterior descending artery: 540  Left circumflex artery: 49  Right coronary artery: 854  Total: 1443  Percentile: 99  Other findings:  Normal pulmonary vein drainage into the left atrium.  Normal left atrial appendage without a thrombus.  Normal size of the pulmonary artery.  Mild mitral annular calcification  IMPRESSION: 1. Coronary calcium score of 1443. This was 19 percentile for age and sex matched control.  2. Normal coronary origin with right dominance.  3. CAD-RADS 3. Moderate stenosis. Consider symptom-guided anti-ischemic pharmacotherapy as well as risk factor modification per guideline directed care. Additional analysis with CT FFR will be submitted.  The noncardiac portion of this study will be interpreted in separate report by the radiologist.   Electronically Signed By: Kardie  Tobb D.O. On: 12/12/2021 15:04  Narrative EXAM: OVER-READ INTERPRETATION  CT CHEST  The following report is an over-read performed by radiologist Dr. Rea Marc of [facility] on 12/12/2021. This over-read does not include interpretation of cardiac or coronary anatomy or pathology. The coronary calcium score/coronary CTA interpretation by the cardiologist is attached.  COMPARISON:  Multiple priors, most recent chest CT dated January 25, 2017  FINDINGS: Vascular: Normal heart size. Normal caliber thoracic aorta with moderate atherosclerotic disease. No suspicious filling defects of the visualized pulmonary arteries.  Mediastinum/Nodes: Esophagus is unremarkable. No pathologically enlarged lymph nodes seen in the chest.  Lungs/Pleura: Central airways are patent. Right lower lobe patchy consolidations. Left lower lobe nodule measuring 5  mm on series 11, image 30, unchanged size of compared with priors dating back to 2015, favored to be benign. Centrilobular emphysema. No evidence of pleural effusion or pneumothorax.  Upper Abdomen: No acute abnormality.  Musculoskeletal: No chest wall mass or suspicious bone lesions identified.  IMPRESSION: 1. Patchy right lower lobe consolidations, likely due to infection or aspiration. Recommend follow-up chest CT in 3 months to ensure resolution. 2. Aortic Atherosclerosis (ICD10-I70.0) and Emphysema (ICD10-J43.9).  Electronically Signed: By: Rea Marc M.D. On: 12/12/2021 12:14     ______________________________________________________________________________________________        Risk Assessment/Calculations:    {Does this patient have ATRIAL FIBRILLATION?:[phone]}  {This patient may be at risk for Amyloid. She has one or more dx on the prob list or PMH from the following list -  Abnormal EKG, HFpEF/Diastolic CHF, Aortic Stenosis, LVH, Bilateral Carpal Tunnel Syndrome, Biceps Tendon Rupture, Spinal Stenosis, Pericardial Effusion, Left Atrial Enlargement, Conduction System Disorder. See list below or review PMH.    Click HERE to open Cardiac Amyloid Screening SmartSet to order screening OR Click HERE to defer testing for 1 year or permanently :1}    Physical Exam:    VS:  There were no vitals taken for this visit.   Wt Readings from Last 3 Encounters:  12/31/23 128 lb 11.2 oz (58.4 kg)  12/24/23 128 lb 9.6 oz (58.3 kg)  12/18/23 120 lb (54.4 kg)    Gen: ***  distress, *** obese/well nourished/malnourished   Neck: No JVD, *** carotid bruit Ears: *** Frank Sign Cardiac: No Rubs or Gallops, *** Murmur, ***cardia, *** radial pulses Respiratory: Clear to auscultation bilaterally, *** effort, ***  respiratory rate GI: Soft, nontender, non-distended *** MS: No *** edema; *** moves all extremities Integument: Skin feels *** Neuro:  At time of evaluation,  alert and oriented to person/place/time/situation *** Psych: Normal affect, patient feels ***   ASSESSMENT AND PLAN: .    *** An EKG was ordered for *** and shows ***   Hypertension Uncontrolled hypertension with current carvedilol  regimen. Blood pressure 168/78 mmHg. - Continue carvedilol  6.25 mg twice daily. - Add irbesartan  150 mg daily. - Instruct her to monitor blood pressure at home and report if systolic pressure is below 100 mmHg. - Ensure systolic blood pressure is less than 160 mmHg before surgery.  Coronary Artery Disease (CAD) CAD with significant plaque burden, no ischemia noted in May 2023. Cleared for surgery from cardiac perspective, contingent on blood pressure control. - Monitor cardiac status and ensure blood pressure control prior to surgery. - acceptable risk to proceed with surgery from cardiac perspective  Moderate aortic insufficiency - Ensure echocardiogram is performed around June to assess heart valve status.  Stanly Leavens, MD FASE [facility] Cardiologist [facility]  [address] [place], [address] [place], [place] [address] [phone]  1:15 PM

## 2024-03-24 ENCOUNTER — Telehealth: Payer: Self-pay | Admitting: *Deleted

## 2024-03-24 NOTE — Telephone Encounter (Signed)
 RN placed reminder call to pt regarding upcoming echocardiogram.  Pt educated and verbalized understanding.

## 2024-03-25 ENCOUNTER — Encounter: Payer: Self-pay | Admitting: Internal Medicine

## 2024-03-26 ENCOUNTER — Ambulatory Visit (HOSPITAL_COMMUNITY)
Admission: RE | Admit: 2024-03-26 | Discharge: 2024-03-26 | Disposition: A | Discharge status: Home / Self Care | Source: Ambulatory Visit | Attending: *Deleted | Admitting: *Deleted

## 2024-03-26 DIAGNOSIS — I700 Atherosclerosis of aorta: Secondary | ICD-10-CM

## 2024-03-26 NOTE — Progress Notes (Signed)
 Outpatient did not show up

## 2024-03-27 ENCOUNTER — Encounter (HOSPITAL_COMMUNITY): Payer: Self-pay | Admitting: Physician Assistant

## 2024-03-30 ENCOUNTER — Inpatient Hospital Stay: Admitting: Hematology and Oncology

## 2024-03-30 ENCOUNTER — Telehealth: Payer: Self-pay

## 2024-03-30 ENCOUNTER — Inpatient Hospital Stay

## 2024-03-30 ENCOUNTER — Other Ambulatory Visit: Payer: Self-pay

## 2024-03-30 ENCOUNTER — Ambulatory Visit (HOSPITAL_COMMUNITY)
Admission: RE | Admit: 2024-03-30 | Discharge: 2024-03-30 | Disposition: A | Discharge status: Home / Self Care | Source: Ambulatory Visit | Attending: Cardiology | Admitting: Cardiology

## 2024-03-30 ENCOUNTER — Ambulatory Visit

## 2024-03-30 DIAGNOSIS — I700 Atherosclerosis of aorta: Secondary | ICD-10-CM | POA: Insufficient documentation

## 2024-03-30 DIAGNOSIS — Z170 Estrogen receptor positive status [ER+]: Secondary | ICD-10-CM

## 2024-03-30 LAB — ECHOCARDIOGRAM COMPLETE
Area-P 1/2: 4.49 cm2
P 1/2 time: 548 ms
S' Lateral: 2 cm

## 2024-03-30 NOTE — Assessment & Plan Note (Deleted)
 08/13/2023: History of prior lumpectomy and radiation. 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

## 2024-03-30 NOTE — Telephone Encounter (Signed)
 Patient missed her MD and infusion appointment today because she states she did not remember. I called and spoke with patient's niece Rosa, who states that pt has been experiencing further memory deficits, hoarding her medications in mint canisters, and forgetting when she has bathed, States patient is just acting weird. Denies complaints of headache or blurred vision. Per MD, order placed  for MRI brain w wo  contrast for metastatic disease evaluation.  Message sent to scheduling to reschedule patient's MD visit and Herceptin for one week after MRI so the MD can review results with patient and niece. [name]  was also made aware that patient missed appointment for her echo and this will need to be rescheduled as well.

## 2024-04-01 ENCOUNTER — Ambulatory Visit: Payer: Self-pay | Admitting: Physician Assistant

## 2024-04-01 DIAGNOSIS — I700 Atherosclerosis of aorta: Secondary | ICD-10-CM

## 2024-04-02 ENCOUNTER — Other Ambulatory Visit: Payer: Self-pay | Admitting: Hematology and Oncology

## 2024-04-02 NOTE — Progress Notes (Signed)
 Pharmacist Chemotherapy Monitoring - Initial Assessment    Anticipated start date: 04/09/24   The following has been reviewed per standard work regarding the patient's treatment regimen: The patient's diagnosis, treatment plan and drug doses, and organ/hematologic function Lab orders and baseline tests specific to treatment regimen  The treatment plan start date, drug sequencing, and pre-medications Prior authorization status  Patient's documented medication list, including drug-drug interaction screen and prescriptions for anti-emetics and supportive care specific to the treatment regimen The drug concentrations, fluid compatibility, administration routes, and timing of the medications to be used The patient's access for treatment and lifetime cumulative dose history, if applicable  The patient's medication allergies and previous infusion related reactions, if applicable   Changes made to treatment plan:  N/A  Follow up needed:  N/A   Victoria Blair, RPH, 04/02/2024  11:15 AM

## 2024-04-03 ENCOUNTER — Ambulatory Visit (HOSPITAL_COMMUNITY)
Admission: RE | Admit: 2024-04-03 | Discharge: 2024-04-03 | Disposition: A | Discharge status: Home / Self Care | Source: Ambulatory Visit | Attending: Hematology and Oncology | Admitting: Hematology and Oncology

## 2024-04-03 DIAGNOSIS — C50919 Malignant neoplasm of unspecified site of unspecified female breast: Secondary | ICD-10-CM | POA: Diagnosis not present

## 2024-04-03 DIAGNOSIS — G319 Degenerative disease of nervous system, unspecified: Secondary | ICD-10-CM

## 2024-04-03 DIAGNOSIS — Z170 Estrogen receptor positive status [ER+]: Secondary | ICD-10-CM | POA: Diagnosis not present

## 2024-04-03 DIAGNOSIS — C50411 Malignant neoplasm of upper-outer quadrant of right female breast: Secondary | ICD-10-CM | POA: Insufficient documentation

## 2024-04-03 MED ORDER — GADOBUTROL 1 MMOL/ML IV SOLN
6.0000 mL | Freq: Once | INTRAVENOUS | Status: AC | PRN
  Administered 2024-04-03: 6 mL via INTRAVENOUS

## 2024-04-07 ENCOUNTER — Inpatient Hospital Stay: Admitting: Hematology and Oncology

## 2024-04-07 ENCOUNTER — Inpatient Hospital Stay

## 2024-04-07 ENCOUNTER — Ambulatory Visit: Admitting: Endocrinology

## 2024-04-08 NOTE — Assessment & Plan Note (Deleted)
 08/13/2023: History of prior lumpectomy and radiation. 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

## 2024-04-09 ENCOUNTER — Encounter: Payer: Self-pay | Admitting: *Deleted

## 2024-04-09 ENCOUNTER — Ambulatory Visit

## 2024-04-09 ENCOUNTER — Inpatient Hospital Stay: Admitting: Hematology and Oncology

## 2024-04-09 ENCOUNTER — Telehealth: Payer: Self-pay

## 2024-04-09 ENCOUNTER — Inpatient Hospital Stay

## 2024-04-09 DIAGNOSIS — Z170 Estrogen receptor positive status [ER+]: Secondary | ICD-10-CM

## 2024-04-09 NOTE — Progress Notes (Signed)
 Pt no show for appts.  Pt has not shown for appts over the last 3 months to start tx.  Future appts canceled and no show letter mailed to pt address on file for pt to reach out to our office when she is ready to resume tx.

## 2024-04-09 NOTE — Telephone Encounter (Signed)
 I called Patient  due to them being over 1 hour late for appointment. I was sent to voicemail, I left a message with infusion room  phone number and Her providers phone number asking if she could call  back.

## 2024-04-11 ENCOUNTER — Emergency Department (HOSPITAL_COMMUNITY)

## 2024-04-11 ENCOUNTER — Emergency Department (HOSPITAL_COMMUNITY)
Admission: EM | Admit: 2024-04-11 | Discharge: 2024-04-11 | Disposition: A | Discharge status: Home / Self Care | Attending: Emergency Medicine | Admitting: Emergency Medicine

## 2024-04-11 ENCOUNTER — Other Ambulatory Visit: Payer: Self-pay

## 2024-04-11 ENCOUNTER — Encounter (HOSPITAL_COMMUNITY): Payer: Self-pay | Admitting: Emergency Medicine

## 2024-04-11 DIAGNOSIS — R42 Dizziness and giddiness: Secondary | ICD-10-CM | POA: Diagnosis not present

## 2024-04-11 DIAGNOSIS — Z853 Personal history of malignant neoplasm of breast: Secondary | ICD-10-CM | POA: Diagnosis not present

## 2024-04-11 DIAGNOSIS — N179 Acute kidney failure, unspecified: Secondary | ICD-10-CM | POA: Diagnosis not present

## 2024-04-11 DIAGNOSIS — R569 Unspecified convulsions: Secondary | ICD-10-CM | POA: Insufficient documentation

## 2024-04-11 DIAGNOSIS — I6523 Occlusion and stenosis of bilateral carotid arteries: Secondary | ICD-10-CM | POA: Diagnosis not present

## 2024-04-11 DIAGNOSIS — S2242XA Multiple fractures of ribs, left side, initial encounter for closed fracture: Secondary | ICD-10-CM | POA: Diagnosis not present

## 2024-04-11 DIAGNOSIS — R55 Syncope and collapse: Secondary | ICD-10-CM | POA: Diagnosis not present

## 2024-04-11 DIAGNOSIS — N390 Urinary tract infection, site not specified: Secondary | ICD-10-CM | POA: Insufficient documentation

## 2024-04-11 LAB — URINALYSIS, ROUTINE W REFLEX MICROSCOPIC
Bilirubin Urine: NEGATIVE
Glucose, UA: NEGATIVE mg/dL
Ketones, ur: 20 mg/dL — AB
Nitrite: NEGATIVE
Protein, ur: NEGATIVE mg/dL
Specific Gravity, Urine: 1.013 (ref 1.005–1.030)
WBC, UA: >50 WBC/hpf (ref 0–5)
pH: 5 (ref 5.0–8.0)

## 2024-04-11 LAB — COMPREHENSIVE METABOLIC PANEL WITH GFR
ALT: 10 U/L (ref 0–44)
AST: 15 U/L (ref 15–41)
Albumin: 3.2 g/dL — ABNORMAL LOW (ref 3.5–5.0)
Alkaline Phosphatase: 42 U/L (ref 38–126)
Anion gap: 6 (ref 5–15)
BUN: 26 mg/dL — ABNORMAL HIGH (ref 8–23)
CO2: 26 mmol/L (ref 22–32)
Calcium: 8.6 mg/dL — ABNORMAL LOW (ref 8.9–10.3)
Chloride: 106 mmol/L (ref 98–111)
Creatinine, Ser: 1.11 mg/dL — ABNORMAL HIGH (ref 0.44–1.00)
GFR, Estimated: 52 mL/min — ABNORMAL LOW (ref >60)
Glucose, Bld: 99 mg/dL (ref 70–99)
Potassium: 3.8 mmol/L (ref 3.5–5.1)
Sodium: 138 mmol/L (ref 135–145)
Total Bilirubin: 0.6 mg/dL (ref 0.0–1.2)
Total Protein: 6 g/dL — ABNORMAL LOW (ref 6.5–8.1)

## 2024-04-11 LAB — CBC WITH DIFFERENTIAL/PLATELET
Abs Immature Granulocytes: 0.02 K/uL (ref 0.00–0.07)
Basophils Absolute: 0 K/uL (ref 0.0–0.1)
Basophils Relative: 0 %
Eosinophils Absolute: 0.1 K/uL (ref 0.0–0.5)
Eosinophils Relative: 2 %
HCT: 32.4 % — ABNORMAL LOW (ref 36.0–46.0)
Hemoglobin: 10.2 g/dL — ABNORMAL LOW (ref 12.0–15.0)
Immature Granulocytes: 0 %
Lymphocytes Relative: 17 %
Lymphs Abs: 1.2 K/uL (ref 0.7–4.0)
MCH: 29.7 pg (ref 26.0–34.0)
MCHC: 31.5 g/dL (ref 30.0–36.0)
MCV: 94.5 fL (ref 80.0–100.0)
Monocytes Absolute: 0.7 K/uL (ref 0.1–1.0)
Monocytes Relative: 11 %
Neutro Abs: 4.8 K/uL (ref 1.7–7.7)
Neutrophils Relative %: 70 %
Platelets: 272 K/uL (ref 150–400)
RBC: 3.43 MIL/uL — ABNORMAL LOW (ref 3.87–5.11)
RDW: 13.2 % (ref 11.5–15.5)
WBC: 6.8 K/uL (ref 4.0–10.5)
nRBC: 0 % (ref 0.0–0.2)

## 2024-04-11 LAB — CBG MONITORING, ED: Glucose-Capillary: 82 mg/dL (ref 70–99)

## 2024-04-11 MED ORDER — SODIUM CHLORIDE 0.9 % IV SOLN
1.0000 g | Freq: Once | INTRAVENOUS | Status: AC
  Administered 2024-04-11: 1 g via INTRAVENOUS
  Filled 2024-04-11: qty 10

## 2024-04-11 MED ORDER — LACTATED RINGERS IV BOLUS
1000.0000 mL | Freq: Once | INTRAVENOUS | Status: AC
  Administered 2024-04-11: 1000 mL via INTRAVENOUS

## 2024-04-11 MED ORDER — CEPHALEXIN 500 MG PO CAPS: 500.0000 mg | ORAL_CAPSULE | Freq: Three times a day (TID) | ORAL | 0 refills | Status: AC

## 2024-04-11 NOTE — ED Provider Notes (Signed)
 Patient was signed out to myself at shift change.  Pending urinalysis at this point.  Patient had what appears to be a syncopal event versus seizure.  Outgoing provider noted that she spoke with neurology who did recommend outpatient follow-up.   Physical Exam  BP 138/65   Pulse 72   Temp (!) 97.5 F (36.4 C) (Oral)   Resp 16   Ht 5' 7 (1.702 m)   Wt 61.2 kg   SpO2 98%   BMI 21.14 kg/m   Physical Exam  Procedures  Procedures  ED Course / MDM    Medical Decision Making Amount and/or Complexity of Data Reviewed Labs: ordered. Radiology: ordered.  Risk Prescription drug management.   Patient is stable for discharge home at this time.  Will cover for urinary tract infection at this time.  Was given dose of Rocephin  in the emergency department.  Will send urine for culture as well.  Vital signs are stable with no indication for sepsis.  Ambulatory referral to neurology has been placed.       Victoria Lonni BIRCH, PA-C 04/11/24 Victoria Suzette Pac, MD 04/12/24 1105

## 2024-04-11 NOTE — ED Provider Notes (Signed)
 [place] EMERGENCY DEPARTMENT AT [facility] Provider Note   CSN: [id number] Arrival date & time: 04/11/24  1331     Patient presents with: Near Syncope   Victoria Blair is a 78 y.o. female.  History of breast cancer.  Last had surgery December 25, 2023.  Dents ER today complaining of dizziness.  She is brought in by EMS.  The EMS report that patient was sitting at home and witnessed by family to have loss of consciousness for less than 1 minute.  Patient states she did feel lightheaded but did not think she passed out.  She states when she stood up to go outside she got more dizzy and again said she did not pass out.  Denies any chest pain or shortness of breath, no palpitations, no leg pain or swelling.  Denies any nausea vomiting or diarrhea.  I was able to obtain further information from family at bedside who witnessed the event.  Caregiver states she was entering her room and patient was coming out of the room, at her hand on the doorknob.  She held the patient's name, patient did not respond and was just staring, had gaze fixed to the left and then started having shaking of her extremities that was right described as mild tremors.  Caregiver states her son has seizures that she thought this might be what it was so she was able to lower patient to the ground.  Patient was unconscious for about 1 minute and had some mild diffuse tremors but no tonic-clonic activity, apnea, tongue biting or incontinence.  EMS was called at that time, patient seemed to be confused when she woke up.  It was noted patient was hypotensive.    Near Syncope       Prior to Admission medications   Medication Sig Start Date End Date Taking? Authorizing Provider  anastrozole  (ARIMIDEX ) 1 MG tablet Take 1 tablet (1 mg total) by mouth daily. 12/31/23   Gudena, Vinay, MD  ascorbic acid (VITAMIN C) 500 MG tablet Take 500 mg by mouth daily.    [provider]  BLACK CURRANT SEED OIL PO Take 1  capsule by mouth daily.    [provider]  carvedilol  (COREG ) 6.25 MG tablet Take 1 tablet (6.25 mg total) by mouth 2 (two) times daily. 11/12/23   Meng, Hao, PA  cholecalciferol (VITAMIN D3) 25 MCG (1000 UNIT) tablet Take 1,000 Units by mouth daily.    [provider]  EPINEPHrine  0.3 mg/0.3 mL IJ SOAJ injection Inject 0.3 mg into the muscle as needed for anaphylaxis. 03/24/23   Stuart Vernell Norris, PA-C  irbesartan  (AVAPRO ) 150 MG tablet Take 1 tablet (150 mg total) by mouth daily. 12/12/23   Meng, Hao, PA  levothyroxine  (SYNTHROID ) 88 MCG tablet Take 1 tablet (88 mcg total) by mouth daily before breakfast. 12/27/23   Thapa, [place], MD  magnesium  oxide (MAG-OX) 400 (240 Mg) MG tablet Take 400 mg by mouth daily.    [provider]  nitroGLYCERIN  (NITROSTAT ) 0.4 MG SL tablet Place 1 tablet (0.4 mg total) under the tongue every 5 (five) minutes as needed for chest pain (may take up to 3 tablets). 12/15/21   Santo Stanly LABOR, MD  Probiotic Product (PROBIOTIC DAILY) CAPS Take 1 capsule by mouth daily.    [provider]    Allergies: Erythromycin base, Hydrocodone -acetaminophen , Carbamide peroxide, Lipitor [atorvastatin], and Lisinopril     Review of Systems  Cardiovascular:  Positive for near-syncope.    Updated  Vital Signs BP (!) 115/46 (BP Location: Left Arm)   Pulse (!) 54   Temp (!) 97.5 F (36.4 C) (Oral)   Resp (!) 21   Ht 5' 7 (1.702 m)   Wt 61.2 kg   SpO2 98%   BMI 21.14 kg/m   Physical Exam Vitals and nursing note reviewed.  Constitutional:      General: She is not in acute distress.    Appearance: She is well-developed.  HENT:     Head: Normocephalic and atraumatic.     Mouth/Throat:     Mouth: Mucous membranes are moist.  Eyes:     Extraocular Movements: Extraocular movements intact.     Conjunctiva/sclera: Conjunctivae normal.     Pupils: Pupils are equal, round, and reactive to light.  Cardiovascular:     Rate and Rhythm:  Normal rate and regular rhythm.     Heart sounds: No murmur heard. Pulmonary:     Effort: Pulmonary effort is normal. No respiratory distress.     Breath sounds: Normal breath sounds.  Abdominal:     Palpations: Abdomen is soft.     Tenderness: There is no abdominal tenderness.  Musculoskeletal:        General: No swelling. Normal range of motion.     Cervical back: Normal range of motion and neck supple.     Right lower leg: No edema.     Left lower leg: No edema.  Skin:    General: Skin is warm and dry.     Capillary Refill: Capillary refill takes less than 2 seconds.  Neurological:     General: No focal deficit present.     Mental Status: She is alert and oriented to person, place, and time.     Cranial Nerves: No cranial nerve deficit.     Sensory: No sensory deficit.     Motor: No weakness.     Gait: Gait normal.  Psychiatric:        Mood and Affect: Mood normal.     (all labs ordered are listed, but only abnormal results are displayed) Labs Reviewed - No data to display  EKG: EKG Interpretation Date/Time:  Saturday April 11 2024 13:45:08 EDT Ventricular Rate:  55 PR Interval:  165 QRS Duration:  82 QT Interval:  480 QTC Calculation: 460 R Axis:   88  Text Interpretation: Sinus rhythm Multiple ventricular premature complexes Borderline right axis deviation Confirmed by Cleotilde Rogue ([id number]) on 04/11/2024 1:49:20 PM  Radiology: No results found.   Procedures   Medications Ordered in the ED - No data to display                                  Medical Decision Making Differential diagnosis includes but limited to syncope, near syncope, seizure, CVA, other  Course: Patient presents for loss of consciousness, caregiver at bedside states that she standing, froze with her hand on a doorknob had gaze deviation and some generalized shaking, was lowered to the ground and was unconscious for about a minute with some mild shaking.  She had no tongue biting, no  incontinence.  They called EMS to transport the patient and she was slightly hypotensive, labs do show mild AKI, UA is pending as family was concerned she may have a UTI.  Family felt like patient had a seizure, caregiver states that she has witnessed her son had seizures frequently and this looks similar.  She was confused when she got here but is back to her baseline.  When she got here she was telling me that she was on chemotherapy for breast cancer, but is now able to tell me with family confirming that she had a lumpectomy earlier this year and decided she did not proceed with the infusions that they recommended as she was concerned about side effects.  She has no focal neurologic deficits, CT head is negative.    I consulted allergy and spoke with Dr. Matthews.  I described the episode the patient had to give further recommendations.  She feels patient can be discharged home with driving restriction for at least 6 months and outpatient neurology follow-up.  And family were informed of this plan and the driving restrictions.  This was also written on her discharge instructions.  Urinalysis is pending, signed out to Medford Conger, PA-C pending this.  If negative patient can be discharged, if positive discharged with antibiotics. Family were given strict return precautions.  Amount and/or Complexity of Data Reviewed Labs: ordered. Radiology: ordered.        Final diagnoses:  None    ED Discharge Orders     None          Suellen Sherran DELENA DEVONNA 04/11/24 JUDITHANN Cleotilde Rogue, MD 04/12/24 [phone]

## 2024-04-11 NOTE — Discharge Instructions (Addendum)
 It was a pleasure taking care of you today.  You had an episode of loss of consciousness followed by some shaking and confusion concerning for possible seizure.  Your blood work showed some mild dehydration for which you are given IV fluids.  Make sure you drink plenty of fluids at home to stay well-hydrated.  Your CT scan of your head was normal today.  Your recent MRI did not show any metastatic disease or stroke.  Spoke with the neurologist while you are here, she does not feel you need to stay in the hospital for this but wants you to follow-up with outpatient neurology for further workup.  They will likely order an EEG to check for seizure activity.  If you have another episode or have any other new or worsening symptoms come back to the ER.  It is extremely important that you do not drive for at least 6 months.  This is a [employer].  If you have a seizure while driving you could intake sure the life of others as well as your own life.  It is also important to avoid any activity that could be dangerous if you lose consciousness and have a seizure such as swimming, taking a bath.  Do not lock up doors.  I am including a comprehensive list of seizure precautions.  [place] statutes, patients with seizures are not allowed to drive until they have been seizure-free for six months.  Other recommendations include using caution when using heavy equipment or power tools. Avoid working on ladders or at heights. Take showers instead of baths.  Do not swim alone.  Ensure the water temperature is not too high on the home water heater. Do not go swimming alone. Do not lock yourself in a room alone (i.e. bathroom). When caring for infants or small children, sit down when holding, feeding, or changing them to minimize risk of injury to the child in the event you have a seizure. Maintain good sleep hygiene. Avoid alcohol.  Also recommend adequate sleep, hydration, good diet and minimize stress.      During the Seizure   - First, ensure adequate ventilation and place patients on the floor on their left side  Loosen clothing around the neck and ensure the airway is patent. If the patient is clenching the teeth, do not force the mouth open with any object as this can cause severe damage - Remove all items from the surrounding that can be hazardous. The patient may be oblivious to what's happening and may not even know what he or she is doing. If the patient is confused and wandering, either gently guide him/her away and block access to outside areas - Reassure the individual and be comforting - Call 911. In most cases, the seizure ends before EMS arrives. However, there are cases when seizures may last over 3 to 5 minutes. Or the individual may have developed breathing difficulties or severe injuries. If a pregnant patient or a person with diabetes develops a seizure, it is prudent to call an ambulance. - Finally, if the patient does not regain full consciousness, then call EMS. Most patients will remain confused for about 45 to 90 minutes after a seizure, so you must use judgment in calling for help. - Avoid restraints but make sure the patient is in a bed with padded side rails - Place the individual in a lateral position with the neck slightly flexed; this will help the saliva drain from the mouth and  prevent the tongue from falling backward - Remove all nearby furniture and other hazards from the area - Provide verbal assurance as the individual is regaining consciousness - Provide the patient with privacy if possible - Call for help and start treatment as ordered by the caregiver   After the Seizure (Postictal Stage)   After a seizure, most patients experience confusion, fatigue, muscle pain and/or a headache. Thus, one should permit the individual to sleep. For the next few days, reassurance is essential. Being calm and helping reorient the person is also of importance.   Most seizures  are painless and end spontaneously. Seizures are not harmful to others but can lead to complications such as stress on the lungs, brain and the heart. Individuals with prior lung problems may develop labored breathing and respiratory distress.

## 2024-04-11 NOTE — ED Notes (Signed)
 Lab to add on urine culture from previously collected urine sample

## 2024-04-11 NOTE — ED Notes (Signed)
 AVS provided by edp was reviewed with the pt and pts caregiver / niece Rosa. Pt/caregiver was able to verbalize understanding with no additional questions at this time. Pharmacy was verified. Pt wheelchair to car by NT

## 2024-04-11 NOTE — ED Triage Notes (Signed)
 Pt bib [facility] after a call from family reporting pt had a syncopal event while sitting in a chair. Family reported the pt did pass out but did not hit head or fall out of the chair. Pt reports she vomited before the event. EMS reported an initial BP of 70/50. Pt was responsive to fluid resuscitation 96/53 in route, 115/46 in triage. Pt reports this has happened before and at that time it was equated to dehydration.

## 2024-04-13 ENCOUNTER — Ambulatory Visit: Payer: Self-pay | Admitting: Hematology and Oncology

## 2024-04-13 ENCOUNTER — Inpatient Hospital Stay

## 2024-04-13 ENCOUNTER — Inpatient Hospital Stay: Admitting: Hematology and Oncology

## 2024-04-13 DIAGNOSIS — R569 Unspecified convulsions: Secondary | ICD-10-CM | POA: Diagnosis not present

## 2024-04-13 LAB — URINE CULTURE

## 2024-04-13 NOTE — Telephone Encounter (Signed)
 Informed her niece that her MRI brain did not show any evidence of metastatic disease

## 2024-04-14 ENCOUNTER — Encounter: Payer: Self-pay | Admitting: Neurology

## 2024-04-15 ENCOUNTER — Other Ambulatory Visit: Payer: Self-pay

## 2024-04-20 ENCOUNTER — Inpatient Hospital Stay

## 2024-04-28 ENCOUNTER — Ambulatory Visit

## 2024-04-30 NOTE — Therapy (Incomplete)
 OUTPATIENT PHYSICAL THERAPY BREAST CANCER POST OP FOLLOW UP   Patient Name: Victoria Blair MRN: [id number] DOB:March 26, 1946, 78 y.o., female Today's Date: 05/01/2024  END OF SESSION:  PT End of Session - 05/01/24 0851     Visit Number 2    Number of Visits 2    Date for PT Re-Evaluation 11/06/23    PT Start Time 0807    PT Stop Time 0848    PT Time Calculation (min) 41 min    Activity Tolerance Patient tolerated treatment well    Behavior During Therapy [facility] for tasks assessed/performed          Past Medical History:  Diagnosis Date   Aortic insufficiency 02/11/2023   TTE 01/23/23: EF 65-70, no RWMA, mod LVH, Gr 1 DD, NL RVSF, NL PASP, RVSP 29.4, mod AI, RAP 3   Breast cancer (HCC) 1997   Depression    GERD (gastroesophageal reflux disease)    Graves disease    Hemorrhoids    Hypertension    Hypothyroidism    Migraines    Osteoporosis    Personal history of radiation therapy 1998   Vitamin D  deficiency    Past Surgical History:  Procedure Laterality Date   AXILLARY SENTINEL NODE BIOPSY Right 12/18/2023   Procedure: RIGHT AXILLARY SENTINEL LYMPH NODE;  Surgeon: Belinda Cough, MD;  Location: [facility] OR;  Service: General;  Laterality: Right;   BREAST BIOPSY Right 08/11/1996   BREAST BIOPSY Right 08/13/2023   US  RT BREAST BX W LOC DEV 1ST LESION IMG BX SPEC US  GUIDE 08/13/2023 [facility] MAMMOGRAPHY   BREAST LUMPECTOMY Right 1998   CESAREAN SECTION     CHOLECYSTECTOMY     MASTECTOMY W/ SENTINEL NODE BIOPSY Right 12/18/2023   Procedure: RIGHT MASTECTOMY;  Surgeon: Belinda Cough, MD;  Location: [facility] OR;  Service: General;  Laterality: Right;  RIGHT MASTECTOMY   SPINE SURGERY     Patient Active Problem List   Diagnosis Date Noted   Malignant neoplasm of upper-outer quadrant of right breast in female, estrogen receptor positive (HCC) 09/06/2023   Vitamin D  deficiency 05/01/2023   H/O Graves' disease 05/01/2023   Aortic insufficiency 02/11/2023   Osteoporosis, 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 Mackey Chad  REFERRING DIAG: Rt breast cancer   THERAPY DIAG:  Malignant neoplasm of upper-outer quadrant of right breast in female, estrogen receptor positive (HCC)  Abnormal posture  Aftercare following surgery for neoplasm  At risk for lymphedema  Rationale for Evaluation and Treatment: Rehabilitation  ONSET DATE: 08/13/2023  SUBJECTIVE:  SUBJECTIVE STATEMENT: My arm feels fine   PERTINENT HISTORY:  Post Rt mastectomy 12/18/23 with 2 negative nodes.  Patient was diagnosed on 08/13/2023 with right grade 2 invasive ductal carcinoma breast cancer. It measures 1.9 cm and is located in the upper outer quadrant. It is triple positive with a Ki67 of 20%. She has a history of right breast cancer in 1998 with a lumpectomy and radiation.   PATIENT GOALS:  Reassess how my recovery is going related to arm function, pain, and swelling.  PAIN:  Are you having pain? No Occasional twinge   PRECAUTIONS: Recent Surgery, right UE Lymphedema risk,   RED FLAGS: None   ACTIVITY LEVEL / LEISURE: Back to normal activity    OBJECTIVE:   PATIENT SURVEYS:  QUICK DASH: 50% ( no baseline for comparison - caregiver says it is not from her arm but other issues )     OBSERVATIONS: Wearing compression.  Well healed incision   POSTURE:  Rounded shoulders    LYMPHEDEMA ASSESSMENT:   UPPER EXTREMITY AROM/PROM:   A/PROM RIGHT   eval   05/01/24  Shoulder extension 42 50  Shoulder flexion 136 120 - denies pain or tightness / 120  Shoulder abduction 137 108 - feels a little tight  / 150  Shoulder internal rotation 72 70  Shoulder external rotation 70 70                          (Blank rows = not tested)   A/PROM LEFT   eval  Shoulder extension 48  Shoulder flexion 131  Shoulder abduction 131  Shoulder internal rotation 76  Shoulder external rotation 78                          (Blank rows = not tested)   CERVICAL AROM: All within normal limits   UPPER EXTREMITY STRENGTH: WFL   LYMPHEDEMA ASSESSMENTS (in cm):    LANDMARK RIGHT   eval 05/01/24  10 cm proximal to olecranon process 23.5 24  Olecranon process 21.8 23  10  cm proximal to ulnar styloid process 17.2 15.8  Just proximal to ulnar styloid process 13.5 14.5  Across hand at thumb web space 17 17.4  At base of 2nd digit 5.7 6.1  (Blank rows = not tested)   LANDMARK LEFT   eval 05/01/24  10 cm proximal to olecranon process 25.2 25.5  Olecranon process 22.8 22.8  10 cm proximal to ulnar styloid process 17.1 16  Just proximal to ulnar styloid process 13.9 15  Across hand at thumb web space 16.8 17.4  At base of 2nd digit 5.5 5.5  (Blank rows = not tested)  Completed LDEX: due to time from surgery   PATIENT EDUCATION:  Education details: post op instructions per below Person educated: Patient Education method: Explanation, Demonstration, and Handouts Education comprehension: verbalized understanding  HOME EXERCISE PROGRAM: Reviewed previously given post op HEP. Access Code: [email or web address] URL: [email or web address] Date: 05/01/2024 Prepared by: Saddie Raw  Exercises - Supine Shoulder Flexion AAROM with Hands Clasped  - 1 x daily - 7 x weekly - 1 sets - 10 reps  - 5 seconds hold - Supine Chest Stretch with Elbows Bent  - 1 x daily - 7 x weekly - 1 sets - 10 reps - 5 seconds hold - Standing Shoulder Abduction Finger Walk at Wall  - 1 x daily - 7 x weekly - 1-3 sets -  10 reps - 5 seconds hold  ASSESSMENT:  CLINICAL IMPRESSION: Pt presents with significant memory/cognitive deficits and has not returned to her baseline AROM but she reports that she has no trouble with the arm or no pain and she is doing all of her ADLs and required activities.  She was shown stretches to keep doing in clinic but had a hard time doing them even in here following instructions.  We will continue with SOZO but her number is very abnormal to begin with.    Pt will benefit from skilled therapeutic intervention to improve on the following deficits: Decreased knowledge of precautions, impaired UE functional use, pain, decreased ROM, postural dysfunction.   PT treatment/interventions: ADL/Self care home management, [phone]- Self Care   GOALS: Goals reviewed with patient? Yes  LONG TERM GOALS:  (STG=LTG)  GOALS Name Target Date  Goal status  1 Pt will demonstrate she has regained full shoulder ROM and function post operatively compared to baselines.  Baseline: 05/01/24 PARTIALLY MET                    PLAN:  PT FREQUENCY/DURATION: SOZO  PLAN FOR NEXT SESSION: SOZO   [facility]  [address]  [place] [address]  [phone]  After Breast Cancer Class Video It is recommended you view the ABC class video to be educated on lymphedema risk reduction. This video lasts for about 30 minutes. It can be viewed on our website here: [email or web address]  Scar massage You can begin gentle scar massage to you incision sites. Gently place one hand on the incision and move the skin (without sliding on the skin) in various directions. Do this for a few minutes and then you can  gently massage either coconut oil or vitamin E cream into the scars.  Compression garment You should continue wearing your compression bra until you feel like you no longer have swelling.  Home exercise Program Continue doing the exercises you were given until you feel like you can do them without feeling any tightness at the end.   Walking Program Studies show that 30 minutes of walking per day (fast enough to elevate your heart rate) can significantly reduce the risk of a cancer recurrence. If you can't walk due to other medical reasons, we encourage you to find another activity you could do (like a stationary bike or water exercise).  Posture After breast cancer surgery, people frequently sit with rounded shoulders posture because it puts their incisions on slack and feels better. If you sit like this and scar tissue forms in that position, you can become very tight and have pain sitting or standing with good posture. Try to be aware of your posture and sit and stand up tall to heal properly.  Follow up PT: It is recommended you return every 3 months for the first 3 years following surgery to be assessed on the SOZO machine for an L-Dex score. This helps prevent clinically significant lymphedema in 95% of patients. These follow up screens are 10 minute appointments that you are not billed for.  Larue Saddie SAUNDERS, PT 05/01/2024, 8:52 AM   PHYSICAL THERAPY DISCHARGE SUMMARY  Visits from Start of Care: 2  Current functional level related to goals / functional outcomes: See above   Remaining deficits: Decreased ROM, lymphedema risk    Education / Equipment: Final HEP  Plan: Patient agrees to discharge.  Patient is being discharged due to meeting the stated rehab goals.  Akio Hudnall, DPT

## 2024-05-01 ENCOUNTER — Encounter: Payer: Self-pay | Admitting: Rehabilitation

## 2024-05-01 ENCOUNTER — Ambulatory Visit: Attending: Hematology and Oncology | Admitting: Rehabilitation

## 2024-05-01 DIAGNOSIS — Z483 Aftercare following surgery for neoplasm: Secondary | ICD-10-CM | POA: Diagnosis not present

## 2024-05-01 DIAGNOSIS — Z170 Estrogen receptor positive status [ER+]: Secondary | ICD-10-CM | POA: Diagnosis present

## 2024-05-01 DIAGNOSIS — Z9189 Other specified personal risk factors, not elsewhere classified: Secondary | ICD-10-CM | POA: Insufficient documentation

## 2024-05-01 DIAGNOSIS — R531 Weakness: Secondary | ICD-10-CM | POA: Diagnosis not present

## 2024-05-01 DIAGNOSIS — C50411 Malignant neoplasm of upper-outer quadrant of right female breast: Secondary | ICD-10-CM | POA: Diagnosis present

## 2024-05-01 DIAGNOSIS — R2689 Other abnormalities of gait and mobility: Secondary | ICD-10-CM | POA: Insufficient documentation

## 2024-05-01 DIAGNOSIS — R293 Abnormal posture: Secondary | ICD-10-CM | POA: Insufficient documentation

## 2024-05-02 ENCOUNTER — Other Ambulatory Visit: Payer: Self-pay

## 2024-05-04 ENCOUNTER — Ambulatory Visit

## 2024-05-05 DIAGNOSIS — N390 Urinary tract infection, site not specified: Secondary | ICD-10-CM | POA: Diagnosis not present

## 2024-05-05 DIAGNOSIS — E039 Hypothyroidism, unspecified: Secondary | ICD-10-CM | POA: Diagnosis not present

## 2024-05-05 DIAGNOSIS — G40909 Epilepsy, unspecified, not intractable, without status epilepticus: Secondary | ICD-10-CM | POA: Diagnosis not present

## 2024-05-05 DIAGNOSIS — N289 Disorder of kidney and ureter, unspecified: Secondary | ICD-10-CM | POA: Diagnosis not present

## 2024-05-11 ENCOUNTER — Inpatient Hospital Stay

## 2024-06-01 ENCOUNTER — Encounter: Payer: Self-pay | Admitting: *Deleted

## 2024-06-01 ENCOUNTER — Ambulatory Visit: Admitting: Hematology and Oncology

## 2024-06-01 ENCOUNTER — Ambulatory Visit

## 2024-06-02 ENCOUNTER — Encounter: Payer: Self-pay | Admitting: Hematology and Oncology

## 2024-06-19 ENCOUNTER — Ambulatory Visit: Admitting: Neurology

## 2024-06-19 ENCOUNTER — Encounter: Payer: Self-pay | Admitting: Neurology

## 2024-06-19 VITALS — BP 156/85 | HR 75 | Ht 67.0 in | Wt 134.6 lb

## 2024-06-19 DIAGNOSIS — G40209 Localization-related (focal) (partial) symptomatic epilepsy and epileptic syndromes with complex partial seizures, not intractable, without status epilepticus: Secondary | ICD-10-CM

## 2024-06-19 DIAGNOSIS — G301 Alzheimer's disease with late onset: Secondary | ICD-10-CM | POA: Diagnosis not present

## 2024-06-19 DIAGNOSIS — F02B0 Dementia in other diseases classified elsewhere, moderate, without behavioral disturbance, psychotic disturbance, mood disturbance, and anxiety: Secondary | ICD-10-CM | POA: Diagnosis not present

## 2024-06-19 MED ORDER — LEVETIRACETAM 250 MG PO TABS: 250.0000 mg | ORAL_TABLET | Freq: Two times a day (BID) | ORAL | 3 refills | Status: AC

## 2024-06-19 MED ORDER — VALTOCO 15 MG DOSE 2 X 7.5 MG/0.1ML NA LQPK: NASAL | 5 refills | Status: AC

## 2024-06-19 NOTE — Progress Notes (Signed)
 NEUROLOGY CONSULTATION NOTE  Victoria Blair MRN: [id number] DOB: 05-03-1946  Referring provider: Sherran Barks, PA-C Primary care provider: Dr. Lamar Ng  Reason for consult:  seizure-like activity   Thank you for your kind referral of Victoria Blair for consultation of the above symptoms. Although her history is well known to you, please allow me to reiterate it for the purpose of our medical record. The patient was accompanied to the clinic by her niece and caregiver Rosa who also provides collateral information. Records and images were personally reviewed where available.  HISTORY OF PRESENT ILLNESS: This is a pleasant 78 year old right-handed woman with a history of hypertension, hypothyroidism, aortic insufficiency, migraines, breast cancer, presenting for evaluation of seizure-like activity. Her niece and caregiver Rosa provides the history due to the patient's cognitive impairment. [name] notes that episodes started 1.5 years ago where family would tell [name] that she was passing out. She has passed out at [employer]. When [name] arrives, she would be very confused. The first time [name] witnessed an episode from the beginning was on 8/16, she went to open the front door and had behavioral arrest, staring off and unresponsive. Both legs started shaking followed by her arms, then she went down to the floor, head was turned to the right. She had urinary incontinence, no tongue bite. She fell asleep after. [name] reports that the week prior, she had an episode and when [name] arrived, she was laying on the floor and did not recognize them. She had another episode early September where she was confused and staring blankly, complaining of a bad headache after. Her PCP started her on Levetiracetam 250mg  BID on 9/9 with no further episodes since then, no side effects. She denies any olfactory/gustatory hallucinations, deja vu, rising epigastric sensation, focal  numbness/tingling/weakness, myoclonic jerks. She had a normal birth and early development.  There is no history of febrile convulsions, CNS infections such as meningitis/encephalitis, significant traumatic brain injury, neurosurgical procedures, or family history of seizures.  She thinks her memory is good, [name] shakes her head. She started having cognitive changes around 2 years ago. Prior to this, she was still driving long distances and very independent. She lives with [name]'s mother-in-law who also has cognitive issues. [name] started helping more when she was diagnosed with breast cancer in January 2025. She was missing bill payments in Jan/March 2025. She started noticing issues and helping with meals in May 2025. [name] is her nephew's wife. [name]'s son is patient's POA managing her finances. [name] lives close by and comes several times a day to administer medications and help with meals. She needs help with dressing and bathing, sometimes she can dress on her own, Rosa has to pick out clothes sometimes.   She used to have migraines but currently denies any headaches. She denies any dizziness, diplopia, dysarthria, dysphagia, neck/back pain, focal numbness/tingling/weakness. No paranoia or hallucinations. She gets at least 8-10 hours of sleep. Her mother and maternal grandmother had early onset dementia (mother passed away age 76).   I personally reviewed MRI brain with and without contrast done 03/2024 which did not show any acute changes. There is mild cerebral and hippocampal atrophy, moderate chronic microvascular disease.   PAST MEDICAL HISTORY: Past Medical History:  Diagnosis Date   Aortic insufficiency 02/11/2023   TTE 01/23/23: EF 65-70, no RWMA, mod LVH, Gr 1 DD, NL RVSF, NL PASP, RVSP 29.4, mod AI, RAP 3   Breast cancer (HCC) 1997   Depression  GERD (gastroesophageal reflux disease)    Graves disease    Hemorrhoids    Hypertension    Hypothyroidism    Migraines     Osteoporosis    Personal history of radiation therapy 1998   Vitamin D  deficiency     PAST SURGICAL HISTORY: Past Surgical History:  Procedure Laterality Date   AXILLARY SENTINEL NODE BIOPSY Right 12/18/2023   Procedure: RIGHT AXILLARY SENTINEL LYMPH NODE;  Surgeon: Belinda Cough, MD;  Location: [facility] OR;  Service: General;  Laterality: Right;   BREAST BIOPSY Right 08/11/1996   BREAST BIOPSY Right 08/13/2023   US  RT BREAST BX W LOC DEV 1ST LESION IMG BX SPEC US  GUIDE 08/13/2023 [facility]   BREAST LUMPECTOMY Right 1998   CESAREAN SECTION     CHOLECYSTECTOMY     MASTECTOMY W/ SENTINEL NODE BIOPSY Right 12/18/2023   Procedure: RIGHT MASTECTOMY;  Surgeon: Belinda Cough, MD;  Location: [facility] OR;  Service: General;  Laterality: Right;  RIGHT MASTECTOMY   SPINE SURGERY      MEDICATIONS: Current Outpatient Medications on File Prior to Visit  Medication Sig Dispense Refill   ascorbic acid (VITAMIN C) 500 MG tablet Take 500 mg by mouth daily.     BLACK CURRANT SEED OIL PO Take 1 capsule by mouth daily.     cholecalciferol (VITAMIN D3) 25 MCG (1000 UNIT) tablet Take 1,000 Units by mouth daily.     irbesartan  (AVAPRO ) 150 MG tablet Take 1 tablet (150 mg total) by mouth daily. 90 tablet 3   levETIRAcetam (KEPPRA) 250 MG tablet Take 250 mg by mouth 2 (two) times daily.     levothyroxine  (SYNTHROID ) 88 MCG tablet Take 1 tablet (88 mcg total) by mouth daily before breakfast. 90 tablet 3   lisinopril  (ZESTRIL ) 5 MG tablet Take 5 mg by mouth daily.     magnesium  oxide (MAG-OX) 400 (240 Mg) MG tablet Take 400 mg by mouth daily.     nitroGLYCERIN  (NITROSTAT ) 0.4 MG SL tablet Place 1 tablet (0.4 mg total) under the tongue every 5 (five) minutes as needed for chest pain (may take up to 3 tablets). 25 tablet 1   Probiotic Product (PROBIOTIC DAILY) CAPS Take 1 capsule by mouth daily.     anastrozole  (ARIMIDEX ) 1 MG tablet Take 1 tablet (1 mg total) by mouth daily. 90 tablet 3   carvedilol  (COREG ) 6.25  MG tablet Take 1 tablet (6.25 mg total) by mouth 2 (two) times daily. 180 tablet 3   EPINEPHrine  0.3 mg/0.3 mL IJ SOAJ injection Inject 0.3 mg into the muscle as needed for anaphylaxis. 1 each 0   No current facility-administered medications on file prior to visit.    ALLERGIES: Allergies  Allergen Reactions   Erythromycin Base Anaphylaxis and Other (See Comments)   Hydrocodone -Acetaminophen  Nausea And Vomiting    Patient passed out with this medication   Carbamide Peroxide    Cetirizine Hcl     Other Reaction(s): Restless legs   Codeine Other (See Comments) and Nausea And Vomiting   Lipitor [Atorvastatin]     Myalgias   Lisinopril  Other (See Comments)    Passed out   Mirtazapine Other (See Comments)    FAMILY HISTORY: Family History  Problem Relation Age of Onset   Heart disease Father    Pulmonary fibrosis Father    Hypertension Father    Pancreatic cancer Sister    Diabetes Maternal Aunt    Sleep apnea Son    Breast cancer Neg Hx  SOCIAL HISTORY: Social History   Socioeconomic History   Marital status: Divorced    Spouse name: Not on file   Number of children: Not on file   Years of education: Not on file   Highest education level: Not on file  Occupational History   Not on file  Tobacco Use   Smoking status: Former    Current packs/day: 0.00    Average packs/day: 0.3 packs/day for 40.0 years (12.0 ttl pk-yrs)    Types: Cigarettes    Quit date: 2025    Years since quitting: 0.8   Smokeless tobacco: Never   Tobacco comments:    Pt states she is smoking about 5 cigarettes a day  Vaping Use   Vaping status: Never Used  Substance and Sexual Activity   Alcohol use: Yes    Alcohol/week: 0.0 standard drinks of alcohol    Comment: seldom   Drug use: No   Sexual activity: Yes  Other Topics Concern   Not on file  Social History Narrative   ** Merged History Encounter **    Are you right handed or left handed? Right handed    Are you currently  employed ?    What is your current occupation?   Do you live at home alone? no   Who lives with you? Lives with in laws and daughter in law check in on them 3 times a day    What type of home do you live in: 1 story or 2 story? 1 story home, 5 steps to to get into home        Social Drivers of Health   Financial Resource Strain: Not on file  Food Insecurity: No Food Insecurity (12/18/2023)   Hunger Vital Sign    Worried About Running Out of Food in the Last Year: Never true    Ran Out of Food in the Last Year: Never true  Transportation Needs: No Transportation Needs (12/18/2023)   PRAPARE - Administrator, Civil Service (Medical): No    Lack of Transportation (Non-Medical): No  Physical Activity: Not on file  Stress: Not on file  Social Connections: Moderately Integrated (12/18/2023)   Social Connection and Isolation Panel    Frequency of Communication with Friends and Family: Three times a week    Frequency of Social Gatherings with Friends and Family: Three times a week    Attends Religious Services: More than 4 times per year    Active Member of Clubs or Organizations: Yes    Attends Banker Meetings: More than 4 times per year    Marital Status: Widowed  Intimate Partner Violence: Not At Risk (12/18/2023)   Humiliation, Afraid, Rape, and Kick questionnaire    Fear of Current or Ex-Partner: No    Emotionally Abused: No    Physically Abused: No    Sexually Abused: No     PHYSICAL EXAM: Vitals:   06/19/24 1025  BP: (!) 156/85  Pulse: 75  SpO2: 98%   General: No acute distress Head:  Normocephalic/atraumatic Skin/Extremities: No rash, no edema Neurological Exam: Mental status: alert and oriented to person, city, could not say state, season. No dysarthria or aphasia, Fund of knowledge is  reduced. Recent and remote memory are impaired.  Attention and concentration are reduced.  Able to name objects, unable to repeat. MMSE 9/30    06/19/2024     3:00 PM  MMSE - Mini Mental State Exam  Orientation to time 1  Orientation  to Place 2  Registration 3  Attention/ Calculation 0  Recall 0  Language- name 2 objects 2  Language- repeat 0  Language- follow 3 step command 1  Language- read & follow direction 0  Write a sentence 0  Copy design 0  Total score 9   Cranial nerves: CN I: not tested CN II: pupils equal, round, visual fields intact CN III, IV, VI:  full range of motion, no nystagmus, no ptosis CN V: facial sensation intact CN VII: upper and lower face symmetric CN VIII: hearing intact to conversation Bulk & Tone: normal, no fasciculations. Motor: 5/5 throughout with no pronator drift. Sensation: intact to light touch, cold.  Romberg test negative Deep Tendon Reflexes: +2 throughout Cerebellar: no incoordination on finger to nose testing Gait: slow and cautious, no ataxia Tremor: none   IMPRESSION: This is a pleasant 78 year old right-handed woman with a history of hypertension, hypothyroidism, aortic insufficiency, migraines, breast cancer, presenting for evaluation of seizure-like activity. Episodes are suggestive of focal impaired awareness seizures. We discussed MMSE 9/30, consistent with a diagnosis of dementia, likely Alzheimer's disease. MRI brain showed cerebral and hippocampal atrophy. She has not had any seizures since initiation of low dose Levetiracetam 250mg  BID, we agreed to continue on current dose. A prescription for prn Valtoco  provided for seizure rescue, side effects discussed. She will be scheduled for a 1-hour EEG. She does not drive. Discussed continued increased supervision. Follow-up in 3 months, call for any changes.    Thank you for allowing me to participate in the care of this patient. Please do not hesitate to call for any questions or concerns.   Darice Shivers, M.D.  CC: Dr. Frederik, Sherran Barks, PA-C

## 2024-06-19 NOTE — Patient Instructions (Signed)
 Good to meet you.  Schedule 1-hour EEG  2. Continue Keppra 250mg  twice a day  3. A prescription was also sent for Valtoco  nasal spray for seizure rescue  4. Follow-up in 3 months, call for any changes   Seizure Precautions: 1. If medication has been prescribed for you to prevent seizures, take it exactly as directed.  Do not stop taking the medicine without talking to your doctor first, even if you have not had a seizure in a long time.   2. Avoid activities in which a seizure would cause danger to yourself or to others.  Don't operate dangerous machinery, swim alone, or climb in high or dangerous places, such as on ladders, roofs, or girders.  Do not drive unless your doctor says you may.  3. If you have any warning that you may have a seizure, lay down in a safe place where you can't hurt yourself.    4.  No driving for 6 months from last seizure, as per [place]  state law.   Please refer to the following link on the Epilepsy Foundation of [employer]'s website for more information: http://www.epilepsyfoundation.org/answerplace/Social/driving/drivingu.cfm   5.  Maintain good sleep hygiene.  6.  Contact your doctor if you have any problems that may be related to the medicine you are taking.  7.  Call 911 and bring the patient back to the ED if:        A.  The seizure lasts longer than 5 minutes.       B.  The patient doesn't awaken shortly after the seizure  C.  The patient has new problems such as difficulty seeing, speaking or moving  D.  The patient was injured during the seizure  E.  The patient has a temperature over 102 F (39C)  F.  The patient vomited and now is having trouble breathing         FALL PRECAUTIONS: Be cautious when walking. Scan the area for obstacles that may increase the risk of trips and falls. When getting up in the mornings, sit up at the edge of the bed for a few minutes before getting out of bed. Consider elevating the bed at the head end to avoid  drop of blood pressure when getting up. Walk always in a well-lit room (use night lights in the walls). Avoid area rugs or power cords from appliances in the middle of the walkways. Use a walker or a cane if necessary and consider physical therapy for balance exercise. Get your eyesight checked regularly.  HOME SAFETY: Consider the safety of the kitchen when operating appliances like stoves, microwave oven, and blender. Consider having supervision and share cooking responsibilities until no longer able to participate in those. Accidents with firearms and other hazards in the house should be identified and addressed as well.  ABILITY TO BE LEFT ALONE: If patient is unable to contact 911 operator, consider using LifeLine, or when the need is there, arrange for someone to stay with patients. Smoking is a fire hazard, consider supervision or cessation. Risk of wandering should be assessed by caregiver and if detected at any point, supervision and safe proof recommendations should be instituted.  RECOMMENDATIONS FOR ALL PATIENTS WITH MEMORY PROBLEMS: 1. Continue to exercise (Recommend 30 minutes of walking everyday, or 3 hours every week) 2. Increase social interactions - continue going to [place] and enjoy social gatherings with friends and family 3. Eat healthy, avoid fried foods and eat more fruits and vegetables 4. Maintain adequate blood  pressure, blood sugar, and blood cholesterol level. Reducing the risk of stroke and cardiovascular disease also helps promoting better memory. 5. Avoid stressful situations. Live a simple life and avoid aggravations. Organize your time and prepare for the next day in anticipation. 6. Sleep well, avoid any interruptions of sleep and avoid any distractions in the bedroom that may interfere with adequate sleep quality 7. Avoid sugar, avoid sweets as there is a strong link between excessive sugar intake, diabetes, and cognitive impairment We discussed the Mediterranean  diet, which has been shown to help patients reduce the risk of progressive memory disorders and reduces cardiovascular risk. This includes eating fish, eat fruits and green leafy vegetables, nuts like almonds and hazelnuts, walnuts, and also use olive oil. Avoid fast foods and fried foods as much as possible. Avoid sweets and sugar as sugar use has been linked to worsening of memory function.  There is always a concern of gradual progression of memory problems. If this is the case, then we may need to adjust level of care according to patient needs. Support, both to the patient and caregiver, should then be put into place.

## 2024-06-21 ENCOUNTER — Other Ambulatory Visit: Payer: Self-pay

## 2024-06-26 ENCOUNTER — Encounter: Payer: Self-pay | Admitting: Neurology

## 2024-06-26 ENCOUNTER — Other Ambulatory Visit

## 2024-06-26 DIAGNOSIS — I2510 Atherosclerotic heart disease of native coronary artery without angina pectoris: Secondary | ICD-10-CM | POA: Diagnosis not present

## 2024-06-26 DIAGNOSIS — R4189 Other symptoms and signs involving cognitive functions and awareness: Secondary | ICD-10-CM | POA: Diagnosis not present

## 2024-06-26 DIAGNOSIS — J438 Other emphysema: Secondary | ICD-10-CM | POA: Diagnosis not present

## 2024-06-26 DIAGNOSIS — N390 Urinary tract infection, site not specified: Secondary | ICD-10-CM | POA: Diagnosis not present

## 2024-06-26 DIAGNOSIS — E890 Postprocedural hypothyroidism: Secondary | ICD-10-CM | POA: Diagnosis not present

## 2024-06-26 DIAGNOSIS — I10 Essential (primary) hypertension: Secondary | ICD-10-CM | POA: Diagnosis not present

## 2024-07-07 ENCOUNTER — Encounter: Payer: Self-pay | Admitting: Neurology

## 2024-07-21 ENCOUNTER — Ambulatory Visit: Admitting: Neurology

## 2024-07-21 DIAGNOSIS — G40209 Localization-related (focal) (partial) symptomatic epilepsy and epileptic syndromes with complex partial seizures, not intractable, without status epilepticus: Secondary | ICD-10-CM

## 2024-07-21 NOTE — Progress Notes (Signed)
 EEG complete and ready for review.

## 2024-07-26 NOTE — Procedures (Signed)
 ELECTROENCEPHALOGRAM REPORT  Date of Study: 07/21/2024  Patient's Name: Victoria Blair MRN: [id number] Date of Birth: 1945/10/17  Referring Provider: Dr. Darice Shivers  Clinical History: This is a 78 year old woman with dementia and episodes of loss of consciousness, witnessed staring, behavioral arrest, and convulsion with head turn to the right.  CNS Active Medications: Keppra   Technical Summary: A multichannel digital EEG recording measured by the international 10-20 system with electrodes applied with paste and impedances below 5000 ohms performed as portable with EKG monitoring in an awake and asleep patient.  Hyperventilation was not performed. Photic stimulation was performed.  The digital EEG was referentially recorded, reformatted, and digitally filtered in a variety of bipolar and referential montages for optimal display.   Description: The patient is awake and asleep during the recording.  During maximal wakefulness, there is a symmetric, medium voltage 8 Hz posterior dominant rhythm that attenuates with eye opening. This is admixed with a moderate amount of diffuse 4-5 Hz theta and small amount of diffuse 2-3 Hz delta slowing of the waking background.  During drowsiness and sleep, there is an increase in theta and delta slowing of the background with poorly formed vertex waves seen.  Photic stimulation did not elicit any abnormalities.  There were no epileptiform discharges or electrographic seizures seen.    EKG lead was unremarkable.  Impression: This awake and asleep EEG is abnormal due to mild to moderate diffuse slowing of the waking background.  Clinical Correlation of the above findings indicates diffuse cerebral dysfunction that is non-specific in etiology and can be seen with hypoxic/ischemic injury, toxic/metabolic encephalopathies, neurodegenerative disorders, or medication effect.  The absence of epileptiform discharges does not rule out a clinical diagnosis of  epilepsy.  Clinical correlation is advised.   Darice Shivers, M.D.

## 2024-07-28 ENCOUNTER — Ambulatory Visit: Payer: Self-pay | Admitting: Neurology

## 2024-07-30 NOTE — Telephone Encounter (Signed)
-----   Message from Darice CHRISTELLA Shivers sent at 07/28/2024 12:39 PM EST ----- Pls let niece know there EEG showed slowing consistent with her dementia, no seizure activity seen. Thanks ----- Message ----- From: Shivers Darice CHRISTELLA, MD Sent: 07/26/2024   6:05 PM EST To: Darice CHRISTELLA Shivers, MD

## 2024-07-30 NOTE — Telephone Encounter (Signed)
 Pt niece called no answer left a voice mail to call the office back

## 2024-07-31 NOTE — Telephone Encounter (Signed)
-----   Message from Darice CHRISTELLA Shivers sent at 07/28/2024 12:39 PM EST ----- Pls let niece know there EEG showed slowing consistent with her dementia, no seizure activity seen. Thanks ----- Message ----- From: Shivers Darice CHRISTELLA, MD Sent: 07/26/2024   6:05 PM EST To: Darice CHRISTELLA Shivers, MD

## 2024-07-31 NOTE — Telephone Encounter (Signed)
 Pt called an informed that EEG showed slowing consistent with her dementia, no seizure activity seen. Pt niece stated that she has been groggy at times and not her normal happy self, she said that she loves to craft and she will take all the thing that she has made and put them all around her. Her niece will put them all back she will go and get them and put them back around her,

## 2024-08-03 ENCOUNTER — Ambulatory Visit: Attending: Hematology and Oncology

## 2024-08-03 DIAGNOSIS — Z483 Aftercare following surgery for neoplasm: Secondary | ICD-10-CM | POA: Insufficient documentation

## 2024-08-03 DIAGNOSIS — R293 Abnormal posture: Secondary | ICD-10-CM | POA: Insufficient documentation

## 2024-08-03 DIAGNOSIS — Z9189 Other specified personal risk factors, not elsewhere classified: Secondary | ICD-10-CM | POA: Insufficient documentation

## 2024-08-03 DIAGNOSIS — R2689 Other abnormalities of gait and mobility: Secondary | ICD-10-CM | POA: Insufficient documentation

## 2024-08-03 DIAGNOSIS — R531 Weakness: Secondary | ICD-10-CM | POA: Insufficient documentation

## 2024-08-03 DIAGNOSIS — C50411 Malignant neoplasm of upper-outer quadrant of right female breast: Secondary | ICD-10-CM | POA: Insufficient documentation

## 2024-08-03 DIAGNOSIS — Z170 Estrogen receptor positive status [ER+]: Secondary | ICD-10-CM | POA: Insufficient documentation

## 2024-09-22 ENCOUNTER — Ambulatory Visit: Payer: Self-pay | Admitting: Neurology

## 2024-09-23 ENCOUNTER — Other Ambulatory Visit: Payer: Self-pay

## 2024-09-23 ENCOUNTER — Telehealth: Payer: Self-pay

## 2024-09-23 NOTE — Telephone Encounter (Signed)
 Note from after hours nurse she stated that pt has been sleep issues and she has been having headaches. She has cut out caffeine . Pt is also having insomnia as well

## 2024-09-24 NOTE — Telephone Encounter (Signed)
 We can try low dose Nortriptyline 10mg  at bedtime to help with both sleep and cut down on headaches. Make sure she is not taking over the counter pain medication (tylenol , ibuprofen) more than 3 times a week otherwise this can cause rebound headaches. Side effects of nortriptyline include drowsiness and dry mouth, if caregiver agrees, pls send in Rx for Nortriptyline 10mg : take 1 capsule every night. We can increase dose later on if needed and on side effects, thanks

## 2024-09-24 NOTE — Telephone Encounter (Signed)
 Called and left detailed message for Victoria Blair and for her to call back with answer.

## 2024-09-25 ENCOUNTER — Other Ambulatory Visit: Payer: Self-pay

## 2024-09-25 MED ORDER — NORTRIPTYLINE HCL 10 MG PO CAPS: 10.0000 mg | ORAL_CAPSULE | Freq: Every day | ORAL | 3 refills | Status: AC

## 2024-09-29 NOTE — Progress Notes (Signed)
 Victoria Blair                                          MRN: [id number]   09/29/2024   The [facility] Quality Team Specialist reviewed this patient medical record for the purposes of chart review for care gap closure. The following were reviewed: chart review for care gap closure-controlling blood pressure.    [facility] Quality Team

## 2024-11-06 ENCOUNTER — Ambulatory Visit: Admitting: Neurology
